# Patient Record
Sex: Male | Born: 1960
Health system: Southern US, Community
[De-identification: ages and names within clinical notes are randomized; demographics above are authoritative.]

## PROBLEM LIST (undated history)

## (undated) DIAGNOSIS — I10 Essential (primary) hypertension: Secondary | ICD-10-CM

## (undated) DIAGNOSIS — K859 Acute pancreatitis without necrosis or infection, unspecified: Secondary | ICD-10-CM

## (undated) DIAGNOSIS — E785 Hyperlipidemia, unspecified: Secondary | ICD-10-CM

## (undated) DIAGNOSIS — E291 Testicular hypofunction: Secondary | ICD-10-CM

## (undated) DIAGNOSIS — K863 Pseudocyst of pancreas: Secondary | ICD-10-CM

## (undated) DIAGNOSIS — E538 Deficiency of other specified B group vitamins: Secondary | ICD-10-CM

## (undated) DIAGNOSIS — K760 Fatty (change of) liver, not elsewhere classified: Secondary | ICD-10-CM

## (undated) DIAGNOSIS — R945 Abnormal results of liver function studies: Secondary | ICD-10-CM

## (undated) DIAGNOSIS — A498 Other bacterial infections of unspecified site: Secondary | ICD-10-CM

## (undated) DIAGNOSIS — S8292XA Unspecified fracture of left lower leg, initial encounter for closed fracture: Secondary | ICD-10-CM

## (undated) DIAGNOSIS — M8580 Other specified disorders of bone density and structure, unspecified site: Secondary | ICD-10-CM

## (undated) DIAGNOSIS — M542 Cervicalgia: Secondary | ICD-10-CM

## (undated) HISTORY — DX: Other specified disorders of bone density and structure, unspecified site: M85.80

## (undated) HISTORY — PX: LEG SURGERY: SHX1003

## (undated) HISTORY — DX: Fatty (change of) liver, not elsewhere classified: K76.0

## (undated) HISTORY — DX: Hyperlipidemia, unspecified: E78.5

## (undated) HISTORY — PX: ELBOW SURGERY: SHX618

## (undated) HISTORY — PX: HERNIA REPAIR: SHX51

## (undated) HISTORY — DX: Acute pancreatitis without necrosis or infection, unspecified: K85.90

## (undated) HISTORY — DX: Unspecified fracture of left lower leg, initial encounter for closed fracture: S82.92XA

## (undated) HISTORY — PX: CHOLECYSTECTOMY: SHX55

## (undated) HISTORY — DX: Testicular hypofunction: E29.1

## (undated) HISTORY — DX: Deficiency of other specified B group vitamins: E53.8

## (undated) HISTORY — DX: Cervicalgia: M54.2

## (undated) HISTORY — DX: Essential (primary) hypertension: I10

## (undated) HISTORY — PX: TIBIA FRACTURE SURGERY: SHX806

## (undated) HISTORY — DX: Other bacterial infections of unspecified site: A49.8

## (undated) HISTORY — DX: Pseudocyst of pancreas: K86.3

## (undated) HISTORY — DX: Abnormal results of liver function studies: R94.5

---

## 2008-12-10 DIAGNOSIS — K859 Acute pancreatitis without necrosis or infection, unspecified: Secondary | ICD-10-CM

## 2008-12-10 DIAGNOSIS — A498 Other bacterial infections of unspecified site: Secondary | ICD-10-CM

## 2008-12-10 HISTORY — DX: Acute pancreatitis without necrosis or infection, unspecified: K85.90

## 2008-12-10 HISTORY — DX: Other bacterial infections of unspecified site: A49.8

## 2009-11-18 ENCOUNTER — Encounter (INDEPENDENT_AMBULATORY_CARE_PROVIDER_SITE_OTHER): Payer: Self-pay | Admitting: *Deleted

## 2009-11-19 ENCOUNTER — Inpatient Hospital Stay (HOSPITAL_COMMUNITY): Admission: EM | Admit: 2009-11-19 | Discharge: 2009-11-30 | Payer: Self-pay | Admitting: Emergency Medicine

## 2009-11-23 ENCOUNTER — Encounter (INDEPENDENT_AMBULATORY_CARE_PROVIDER_SITE_OTHER): Payer: Self-pay | Admitting: *Deleted

## 2009-11-24 ENCOUNTER — Ambulatory Visit: Payer: Self-pay | Admitting: Gastroenterology

## 2009-11-30 ENCOUNTER — Encounter (INDEPENDENT_AMBULATORY_CARE_PROVIDER_SITE_OTHER): Payer: Self-pay | Admitting: *Deleted

## 2009-12-07 ENCOUNTER — Encounter (INDEPENDENT_AMBULATORY_CARE_PROVIDER_SITE_OTHER): Payer: Self-pay | Admitting: *Deleted

## 2009-12-08 ENCOUNTER — Ambulatory Visit: Payer: Self-pay | Admitting: Internal Medicine

## 2009-12-08 DIAGNOSIS — E119 Type 2 diabetes mellitus without complications: Secondary | ICD-10-CM | POA: Insufficient documentation

## 2009-12-08 DIAGNOSIS — Z8719 Personal history of other diseases of the digestive system: Secondary | ICD-10-CM | POA: Insufficient documentation

## 2009-12-08 DIAGNOSIS — I10 Essential (primary) hypertension: Secondary | ICD-10-CM | POA: Insufficient documentation

## 2009-12-08 DIAGNOSIS — E1165 Type 2 diabetes mellitus with hyperglycemia: Secondary | ICD-10-CM | POA: Insufficient documentation

## 2009-12-08 DIAGNOSIS — E785 Hyperlipidemia, unspecified: Secondary | ICD-10-CM | POA: Insufficient documentation

## 2009-12-10 DIAGNOSIS — R7989 Other specified abnormal findings of blood chemistry: Secondary | ICD-10-CM

## 2009-12-10 DIAGNOSIS — M8580 Other specified disorders of bone density and structure, unspecified site: Secondary | ICD-10-CM

## 2009-12-10 HISTORY — DX: Other specified abnormal findings of blood chemistry: R79.89

## 2009-12-10 HISTORY — DX: Other specified disorders of bone density and structure, unspecified site: M85.80

## 2009-12-29 ENCOUNTER — Ambulatory Visit: Payer: Self-pay | Admitting: Internal Medicine

## 2009-12-29 ENCOUNTER — Ambulatory Visit: Payer: Self-pay | Admitting: Gastroenterology

## 2009-12-29 DIAGNOSIS — K862 Cyst of pancreas: Secondary | ICD-10-CM | POA: Insufficient documentation

## 2009-12-29 DIAGNOSIS — K863 Pseudocyst of pancreas: Secondary | ICD-10-CM

## 2009-12-29 DIAGNOSIS — K859 Acute pancreatitis without necrosis or infection, unspecified: Secondary | ICD-10-CM | POA: Insufficient documentation

## 2009-12-29 LAB — CONVERTED CEMR LAB
AST: 38 units/L — ABNORMAL HIGH (ref 0–37)
Albumin: 4.2 g/dL (ref 3.5–5.2)
Alkaline Phosphatase: 85 units/L (ref 39–117)
Bilirubin, Direct: 0.2 mg/dL (ref 0.0–0.3)
GFR calc non Af Amer: 85.03 mL/min (ref >60)
Glucose, Bld: 116 mg/dL — ABNORMAL HIGH (ref 70–99)
HDL: 28.6 mg/dL — ABNORMAL LOW (ref >39.00)
LDL Cholesterol: 112 mg/dL — ABNORMAL HIGH (ref 0–99)
Lipase: 28 units/L (ref 11.0–59.0)
Potassium: 4.7 meq/L (ref 3.5–5.1)
Sodium: 139 meq/L (ref 135–145)
Total CHOL/HDL Ratio: 6
VLDL: 32.8 mg/dL (ref 0.0–40.0)

## 2010-01-06 ENCOUNTER — Ambulatory Visit: Payer: Self-pay | Admitting: Internal Medicine

## 2010-01-13 ENCOUNTER — Ambulatory Visit: Payer: Self-pay | Admitting: Cardiovascular Disease

## 2010-01-13 DIAGNOSIS — K8020 Calculus of gallbladder without cholecystitis without obstruction: Secondary | ICD-10-CM | POA: Insufficient documentation

## 2010-01-20 ENCOUNTER — Encounter: Payer: Self-pay | Admitting: Gastroenterology

## 2010-02-24 ENCOUNTER — Ambulatory Visit (HOSPITAL_COMMUNITY): Admission: RE | Admit: 2010-02-24 | Discharge: 2010-02-24 | Payer: Self-pay | Admitting: Surgery

## 2010-02-24 ENCOUNTER — Encounter (INDEPENDENT_AMBULATORY_CARE_PROVIDER_SITE_OTHER): Payer: Self-pay | Admitting: *Deleted

## 2010-03-03 ENCOUNTER — Ambulatory Visit: Payer: Self-pay | Admitting: Gastroenterology

## 2010-03-29 ENCOUNTER — Telehealth: Payer: Self-pay | Admitting: Internal Medicine

## 2010-04-21 ENCOUNTER — Ambulatory Visit: Payer: Self-pay | Admitting: Internal Medicine

## 2010-04-28 ENCOUNTER — Ambulatory Visit: Payer: Self-pay | Admitting: Internal Medicine

## 2010-04-28 DIAGNOSIS — E538 Deficiency of other specified B group vitamins: Secondary | ICD-10-CM | POA: Insufficient documentation

## 2010-04-28 LAB — CONVERTED CEMR LAB
ALT: 16 units/L (ref 0–53)
AST: 20 units/L (ref 0–37)
Calcium: 8.8 mg/dL (ref 8.4–10.5)
Cholesterol: 208 mg/dL — ABNORMAL HIGH (ref 0–200)
Creatinine, Ser: 1.1 mg/dL (ref 0.4–1.5)
GFR calc non Af Amer: 80.27 mL/min (ref >60)
HDL: 36.1 mg/dL — ABNORMAL LOW (ref >39.00)
Hgb A1c MFr Bld: 6.2 % (ref 4.6–6.5)
Sodium: 140 meq/L (ref 135–145)
Total Protein: 6.7 g/dL (ref 6.0–8.3)
Triglycerides: 235 mg/dL — ABNORMAL HIGH (ref 0.0–149.0)
Vitamin B-12: 179 pg/mL — ABNORMAL LOW (ref 211–911)

## 2010-04-28 LAB — HM DIABETES FOOT EXAM

## 2010-08-18 ENCOUNTER — Ambulatory Visit: Payer: Self-pay | Admitting: Internal Medicine

## 2010-08-18 LAB — CONVERTED CEMR LAB
BUN: 17 mg/dL (ref 6–23)
Creatinine, Ser: 1.1 mg/dL (ref 0.4–1.5)
GFR calc non Af Amer: 79.29 mL/min (ref >60)
Glucose, Bld: 107 mg/dL — ABNORMAL HIGH (ref 70–99)
Hgb A1c MFr Bld: 6.7 % — ABNORMAL HIGH (ref 4.6–6.5)
Potassium: 5.2 meq/L — ABNORMAL HIGH (ref 3.5–5.1)
Vitamin B-12: 642 pg/mL (ref 211–911)

## 2010-08-25 ENCOUNTER — Ambulatory Visit: Payer: Self-pay | Admitting: Internal Medicine

## 2010-08-25 DIAGNOSIS — F341 Dysthymic disorder: Secondary | ICD-10-CM | POA: Insufficient documentation

## 2010-08-25 IMAGING — CT CT HEAD W/O CM
1 series · 16 of 30 positions shown, 20 images · non-contrast
Comparison: None.

CLINICAL DATA: 48-year-old male with blurred vision.  Pancreatitis.

CT HEAD WITHOUT CONTRAST
TECHNIQUE: Contiguous axial images were obtained from the base of
the skull through the vertex without contrast.

[Series 2: headseq 4.8 h45s · axial · 0.43mm/px · z∈[-110,+42]mm · 16 of 36 slices shown, 20 images]
[im 2/36  brain]
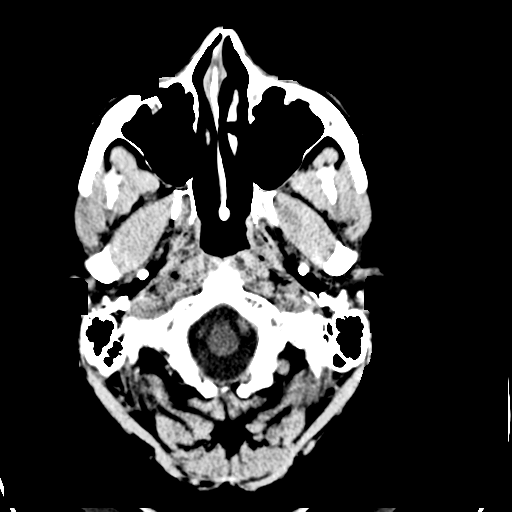
[im 2/36  bone]
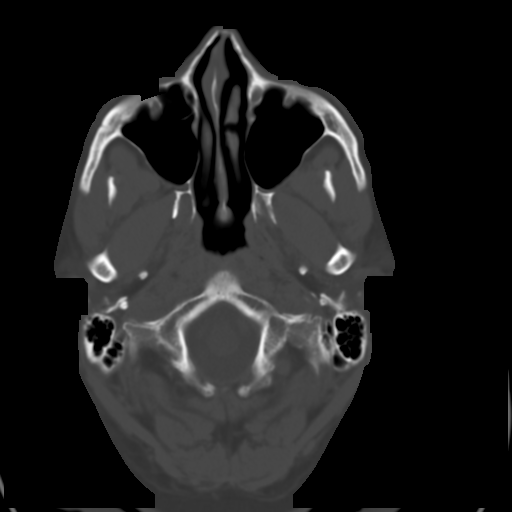
[im 4/36  brain]
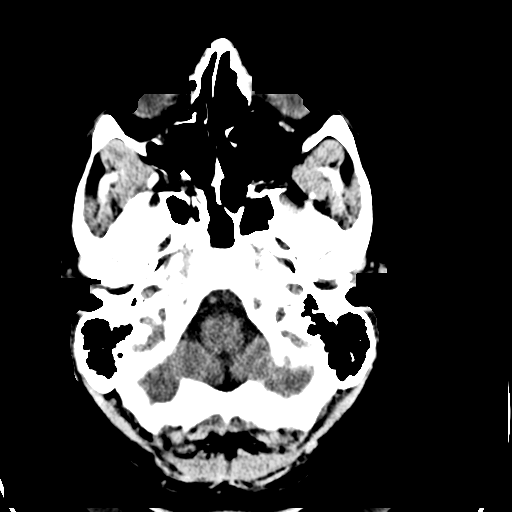
[im 7/36  brain]
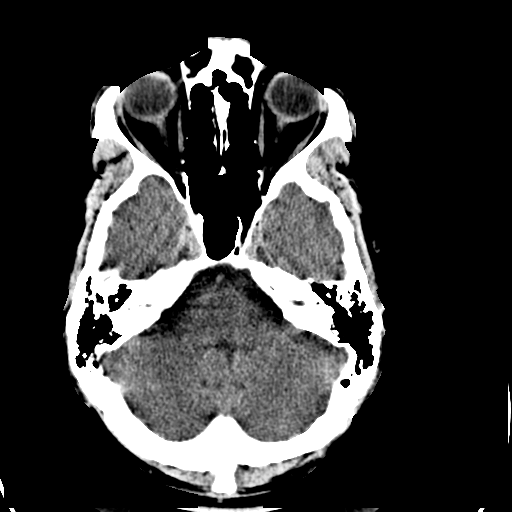
[im 9/36  brain]
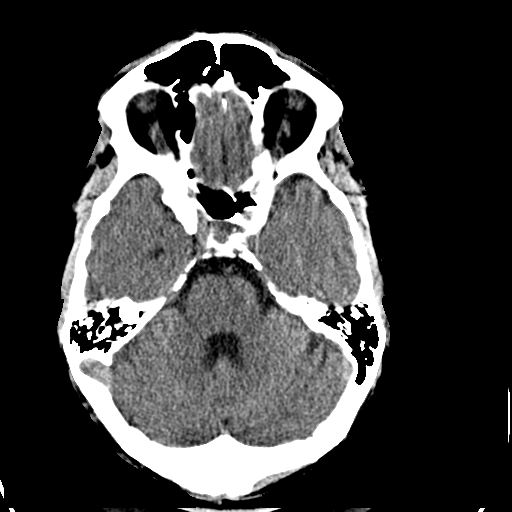
[im 10/36  brain]
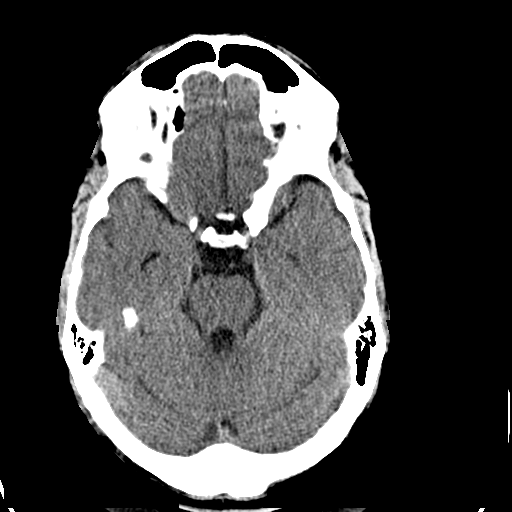
[im 10/36  bone]
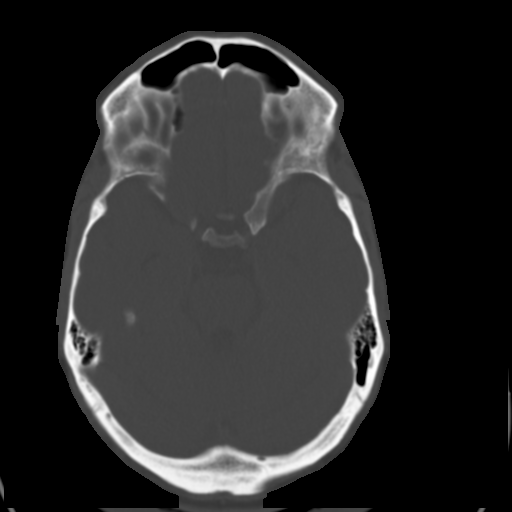
[im 13/36  brain]
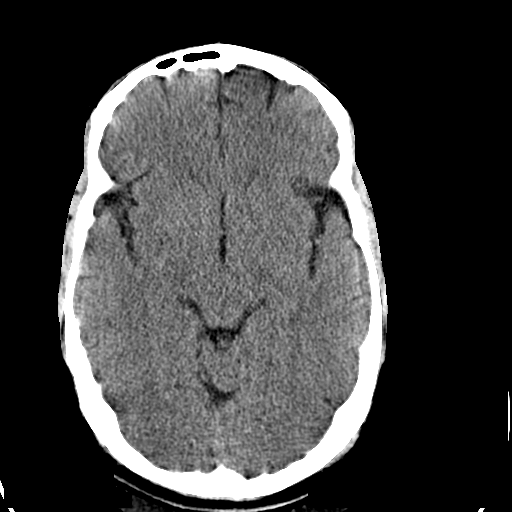
[im 15/36  brain]
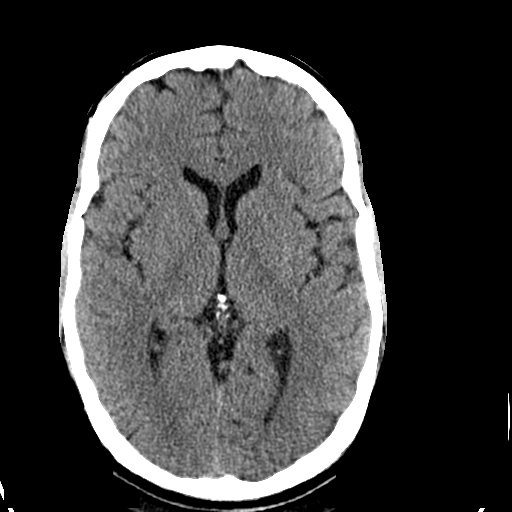
[im 17/36  brain]
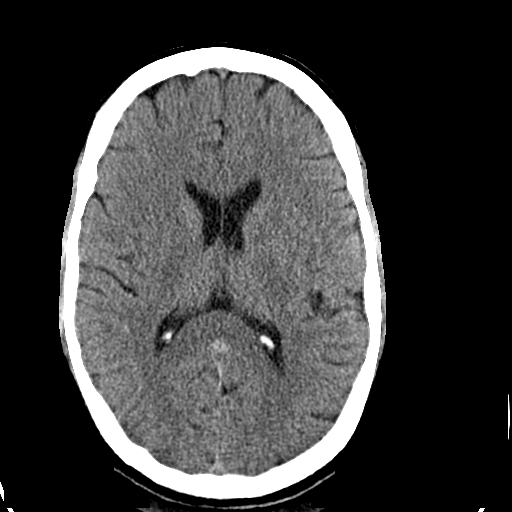
[im 19/36  brain]
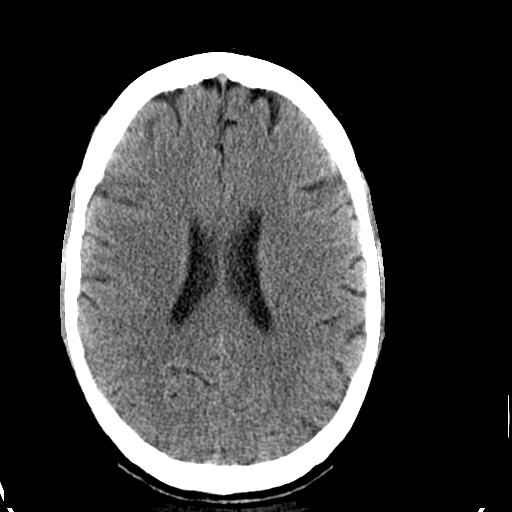
[im 19/36  bone]
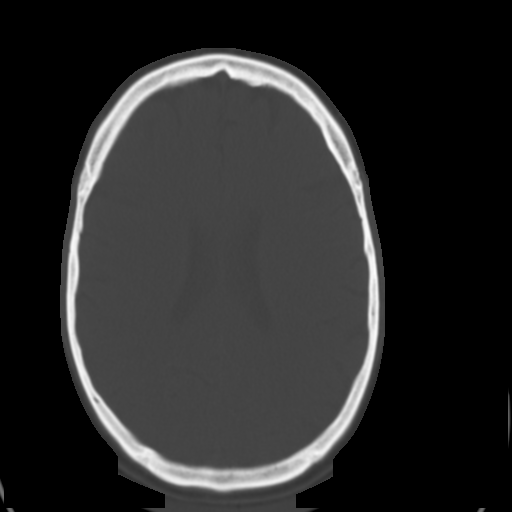
[im 21/36  brain]
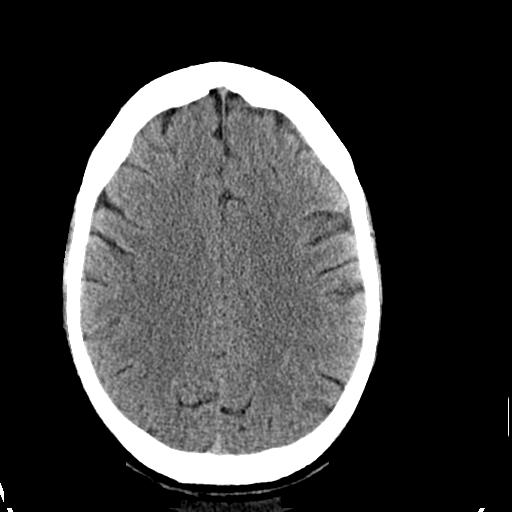
[im 23/36  brain]
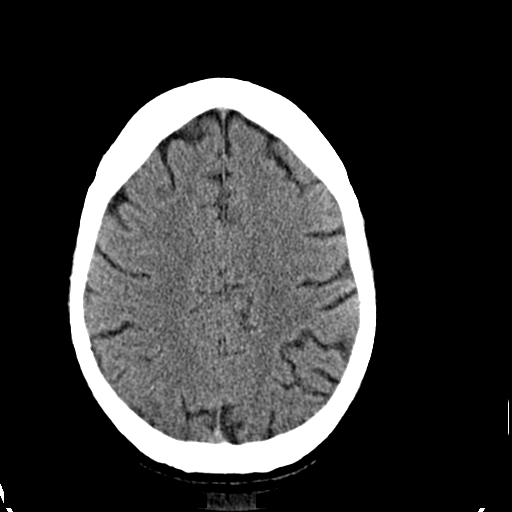
[im 26/36  brain]
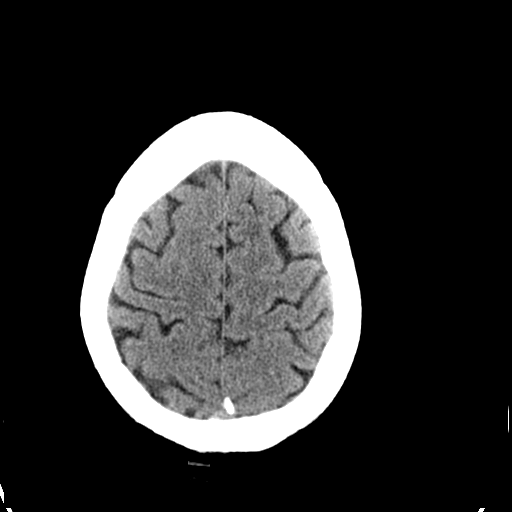
[im 27/36  brain]
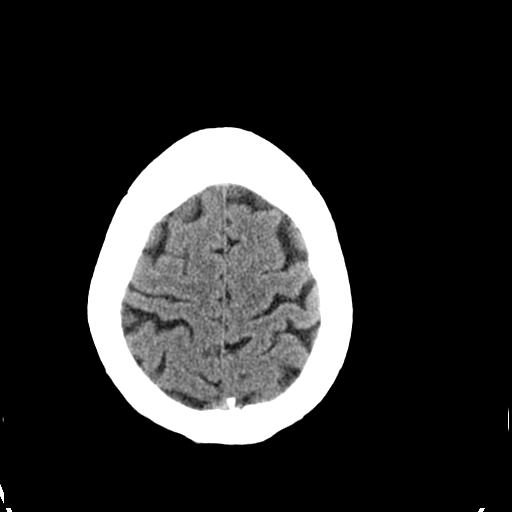
[im 27/36  bone]
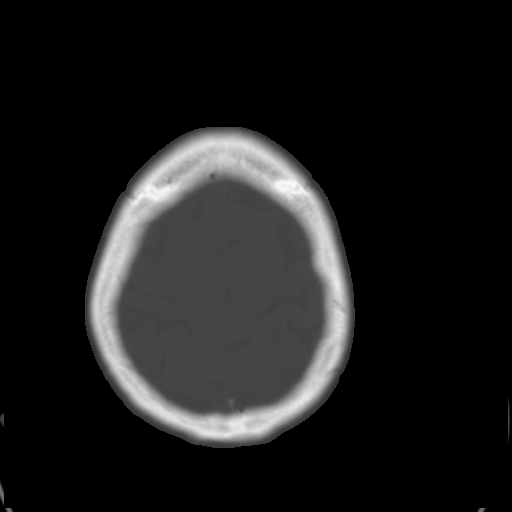
[im 29/36  brain]
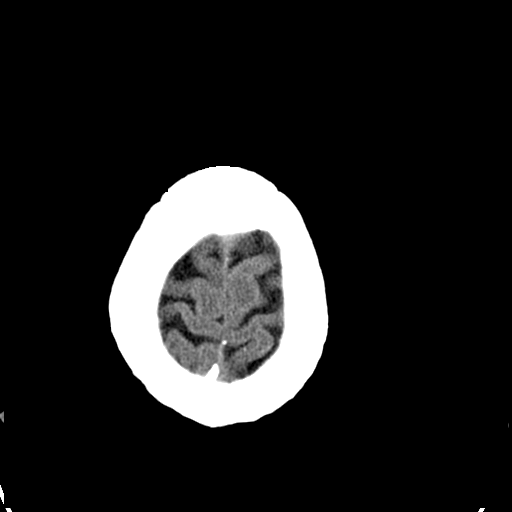
[im 32/36  brain]
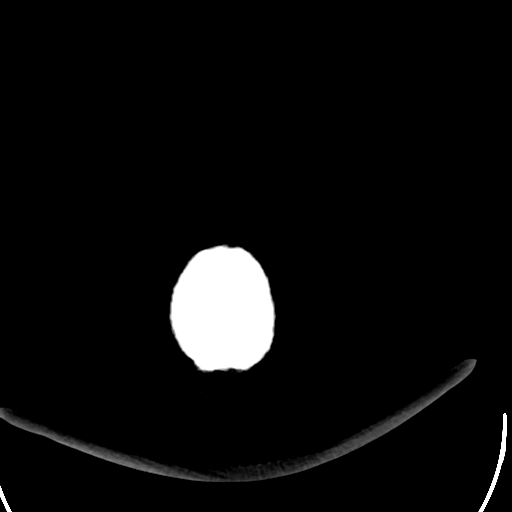
[im 34/36  brain]
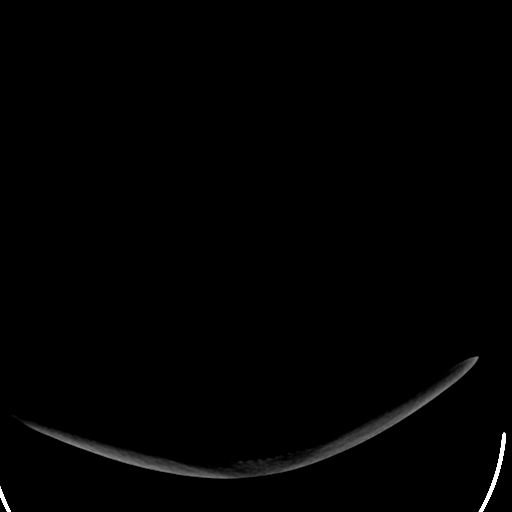

[16 of 30 positions shown; findings below may reference images not displayed]

FINDINGS: Visualized orbit soft tissues are within normal limits.
Visualized scalp soft tissues are within normal limits.  Visualized
paranasal sinuses and mastoids are clear.  No acute osseous
abnormality identified.

Cerebral volume is within normal limits for age.  No midline shift,
ventriculomegaly, mass effect, evidence of mass lesion,
intracranial hemorrhage or evidence of cortically based acute
infarction.  Gray-white matter differentiation is within normal
limits throughout the brain.  No suspicious intracranial vascular
hyperdensity.
IMPRESSION: Visualized orbit soft tissues are within normal limits.  Normal
noncontrast CT appearance of the brain.

## 2010-10-18 ENCOUNTER — Encounter: Payer: Self-pay | Admitting: Internal Medicine

## 2010-10-27 ENCOUNTER — Ambulatory Visit: Payer: Self-pay | Admitting: Internal Medicine

## 2010-10-27 DIAGNOSIS — K7689 Other specified diseases of liver: Secondary | ICD-10-CM | POA: Insufficient documentation

## 2010-10-27 DIAGNOSIS — Z862 Personal history of diseases of the blood and blood-forming organs and certain disorders involving the immune mechanism: Secondary | ICD-10-CM | POA: Insufficient documentation

## 2010-10-27 DIAGNOSIS — Z8639 Personal history of other endocrine, nutritional and metabolic disease: Secondary | ICD-10-CM | POA: Insufficient documentation

## 2010-11-15 ENCOUNTER — Ambulatory Visit: Payer: Self-pay | Admitting: Internal Medicine

## 2010-11-15 DIAGNOSIS — M542 Cervicalgia: Secondary | ICD-10-CM | POA: Insufficient documentation

## 2010-11-15 DIAGNOSIS — R209 Unspecified disturbances of skin sensation: Secondary | ICD-10-CM | POA: Insufficient documentation

## 2010-11-15 DIAGNOSIS — M79609 Pain in unspecified limb: Secondary | ICD-10-CM | POA: Insufficient documentation

## 2010-11-15 DIAGNOSIS — M545 Low back pain, unspecified: Secondary | ICD-10-CM | POA: Insufficient documentation

## 2010-11-17 ENCOUNTER — Ambulatory Visit: Payer: Self-pay | Admitting: Internal Medicine

## 2010-11-24 ENCOUNTER — Ambulatory Visit: Payer: Self-pay | Admitting: Internal Medicine

## 2010-11-29 ENCOUNTER — Ambulatory Visit: Payer: Self-pay | Admitting: Internal Medicine

## 2010-12-08 LAB — CONVERTED CEMR LAB
Alkaline Phosphatase: 105 units/L (ref 39–117)
Bilirubin, Direct: 0.1 mg/dL (ref 0.0–0.3)
CO2: 27 meq/L (ref 19–32)
Calcium: 9 mg/dL (ref 8.4–10.5)
HDL: 30.4 mg/dL — ABNORMAL LOW (ref >39.00)
Sodium: 138 meq/L (ref 135–145)
TSH: 1.95 microintl units/mL (ref 0.35–5.50)
Total CHOL/HDL Ratio: 8
Total CK: 127 units/L (ref 7–232)
Total Protein: 6.9 g/dL (ref 6.0–8.3)
VLDL: 143.2 mg/dL — ABNORMAL HIGH (ref 0.0–40.0)

## 2010-12-10 DIAGNOSIS — E291 Testicular hypofunction: Secondary | ICD-10-CM

## 2010-12-10 HISTORY — DX: Testicular hypofunction: E29.1

## 2010-12-15 DIAGNOSIS — M949 Disorder of cartilage, unspecified: Secondary | ICD-10-CM

## 2010-12-15 DIAGNOSIS — M899 Disorder of bone, unspecified: Secondary | ICD-10-CM | POA: Insufficient documentation

## 2011-01-11 NOTE — Assessment & Plan Note (Signed)
Summary: 1 MTH FU  STC   Vital Signs:  Patient profile:   50 year old male Height:      73 inches Weight:      241 pounds BMI:     31.91 Temp:     98 degrees F oral Pulse rate:   89 / minute BP sitting:   112 / 80  (left arm)  Vitals Entered By: Tora Perches (January 06, 2010 9:24 AM) CC: f/u Is Patient Diabetic? Yes   Primary Care Provider:  Jacinta Shoe, MD  CC:  f/u.  History of Present Illness: The patient presents for a follow up of pancreatitis, diabetes, hyperlipidemia (TG). C/o constipation.   Preventive Screening-Counseling & Management  Alcohol-Tobacco     Smoking Status: never  Current Medications (verified): 1)  Gemfibrozil 600 Mg Tabs (Gemfibrozil) .... 1/2 Two Times A Day 2)  Lisinopril 10 Mg Tabs (Lisinopril) .... 1/2 Once Daily 3)  Metformin Hcl 500 Mg Tabs (Metformin Hcl) .Marland Kitchen.. 1 Tab2  Times A Day 4)  Metoprolol Tartrate 50 Mg Tabs (Metoprolol Tartrate) .... 1/2 Two Times A Day 5)  Tylenol Extra Strength 500 Mg Tabs (Acetaminophen) .... 2 Tab By Mouth As Needed 6)  Vitamin D 1000 Unit Tabs (Cholecalciferol) .Marland Kitchen.. 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    Physical Exam  General:  alert and overweight-appearing.   Nose:  no external deformity and no nasal discharge.   Mouth:  good dentition and pharynx pink and moist.   Lungs:  normal respiratory effort,  normal breath sounds, no crackles, and no wheezes.   Heart:  normal rate, no murmur, no gallop, and no rub.   Abdomen:  soft, non-tender, and no masses.   Msk:  Less stiff LS  Extremities:  No edema B Neurologic:  alert & oriented X3 and cranial nerves II-XII intact.   Skin:  Intact without suspicious lesions or rashes Psych:  Cognition and judgment appear intact. Alert and cooperative with normal attention span and concentration. No apparent delusions, illusions, hallucinations   Impression & Recommendations:  Problem # 1:  PANCREATITIS, ACUTE (ICD-577.0) Assessment Improved  Problem # 2:  PSEUDOCYST, PANCREAS (ICD-577.2) Assessment: Comment Only CT is pending   Problem # 3:  HYPERLIPIDEMIA (ICD-272.4) elev TG Assessment: Improved  His updated medication list for this problem includes:    Gemfibrozil 600 Mg Tabs (Gemfibrozil) .Marland Kitchen... 1/2 two times a day  Problem # 4:  HYPERTENSION (ICD-401.9) Assessment: Improved  His updated medication list for this problem includes:    Lisinopril 10 Mg Tabs (Lisinopril) .Marland Kitchen... 1/2 once daily    Metoprolol Tartrate 50 Mg Tabs (Metoprolol tartrate) .Marland Kitchen... 1/2 two times a day  Problem # 5:  DIABETES MELLITUS, TYPE II (ICD-250.00) Assessment: Improved  His updated medication list for this problem includes:    Lisinopril 10 Mg Tabs (Lisinopril) .Marland Kitchen... 1/2 once daily    Metformin Hcl 500 Mg Tabs (Metformin hcl) .Marland Kitchen... 1 tab2  times a day  Complete Medication List:  1)  Gemfibrozil 600 Mg Tabs (Gemfibrozil) .... 1/2 two times a day 2)  Lisinopril 10 Mg Tabs (Lisinopril) .... 1/2 once daily 3)  Metformin Hcl 500 Mg Tabs (Metformin hcl) .Marland Kitchen.. 1 tab2  times a day 4)  Metoprolol Tartrate 50 Mg Tabs (Metoprolol tartrate) .... 1/2 two times a day 5)  Tylenol Extra Strength 500 Mg Tabs (Acetaminophen) .... 2 tab by mouth as needed 6)  Vitamin D 1000 Unit Tabs (Cholecalciferol) .Marland Kitchen.. 1 by mouth qd  Patient Instructions: 1)  Please schedule a follow-up  appointment in 4 months. 2)  BMP prior to visit, ICD-9: 3)  Hepatic Panel prior to visit, ICD-9: 4)  Lipid Panel prior to visit, ICD-9: 5)  HbgA1C prior to visit, ICD-9:272.2  250.00 6)  Vit B12   Influenza Vaccine (to be given today)

## 2011-01-11 NOTE — Assessment & Plan Note (Signed)
Summary: 3 MTH FU STC   Vital Signs:  Patient profile:   50 year old male Height:      73 inches Weight:      258 pounds BMI:     34.16 Temp:     98.6 degrees F oral Pulse rate:   80 / minute Pulse rhythm:   regular Resp:     16 per minute BP sitting:   140 / 92  (left arm) Cuff size:   large  Vitals Entered By: Lanier Prude, CMA(AAMA) (December 15, 2010 9:30 AM) CC: 3 mo f/u  Is Patient Diabetic? Yes Comments pt is currently not taking Gemfibrozil   Primary Care Provider:  Jacinta Shoe, MD  CC:  3 mo f/u .  History of Present Illness: F/u LBP  - much better and cervical pain down L arm - down to L triceps. F/u on elev TG/chol   Current Medications (verified): 1)  Gemfibrozil 600 Mg Tabs (Gemfibrozil) .... 1/2 Two Times A Day 2)  Lisinopril 10 Mg Tabs (Lisinopril) .... 1/2 Once Daily 3)  Metformin Hcl 500 Mg Tabs (Metformin Hcl) .Marland Kitchen.. 1 Tab By Mouth Tid 4)  Metoprolol Tartrate 50 Mg Tabs (Metoprolol Tartrate) .... 1/2 Two Times A Day 5)  Tylenol Extra Strength 500 Mg Tabs (Acetaminophen) .... 2 Tab By Mouth As Needed 6)  Vitamin D 1000 Unit Tabs (Cholecalciferol) .Marland Kitchen.. 1 By Mouth Qd 7)  Vitamin B-12 500 Mcg Tabs (Cyanocobalamin) .Marland Kitchen.. 1 By Mouth Once Daily For Vitamin B12 Deficiency 8)  Onetouch Ultra Test  Strp (Glucose Blood) .... Use Once Daily Prn 9)  Wellbutrin Sr 150 Mg Xr12h-Tab (Bupropion Hcl) .Marland Kitchen.. 1 By Mouth Once Daily 10)  Alprazolam 0.5 Mg Tabs (Alprazolam) .... 1/2 - 1 By Mouth Two Times A Day As Needed Anxiety 11)  Prednisone 10 Mg Tabs (Prednisone) .... Take 40mg  Qd For 3 Days, Then 20 Mg Qd For 3 Days, Then 10mg  Qd For 6 Days, Then Stop. Take Pc. 12)  Tramadol Hcl 50 Mg Tabs (Tramadol Hcl) .Marland Kitchen.. 1-2 Tabs By Mouth Two Times A Day As Needed Pain 13)  Cyclobenzaprine Hcl 10 Mg Tabs (Cyclobenzaprine Hcl) .... 1/2-1 Tab By Mouth Two Times A Day As Needed Muscle Spasms  Allergies (verified): No Known Drug Allergies  Past History:  Past Medical  History: Diabetes mellitus, type II Hypertension Pancreatitis, hx of 2010 (?etiol) Pancreatic pseudocyst, resolved C diff 2010 Hyperlipidemia Vit B12 def 2011 Elevated LFTs 2011 Fatty liver Osteopenia 2011 Neck pain L  Review of Systems  The patient denies chest pain and syncope.    Physical Exam  General:  alert and overweight-appearing.   Msk:  Lumbar-sacral spine is not  tender to palpation over paraspinal muscles and painfull with the ROM  Str leg elev neg B Cervical spine is tender to palpation over paraspinal muscles and with the ROM   Neurologic:  alert & oriented X3 and cranial nerves II-XII intact.  MS is OK x 4 extr   Impression & Recommendations:  Problem # 1:  LOW BACK PAIN, ACUTE (ICD-724.2) Assessment Improved  His updated medication list for this problem includes:    Tylenol Extra Strength 500 Mg Tabs (Acetaminophen) .Marland Kitchen... 2 tab by mouth as needed    Tramadol Hcl 50 Mg Tabs (Tramadol hcl) .Marland Kitchen... 1-2 tabs by mouth two times a day as needed pain    Cyclobenzaprine Hcl 10 Mg Tabs (Cyclobenzaprine hcl) .Marland Kitchen... 1/2-1 tab by mouth two times a day as needed muscle spasms  Naproxen 500 Mg Tabs (Naproxen) .Marland Kitchen... 1 by mouth two times a day pc for pain/arthritis  Problem # 2:  NECK PAIN (ICD-723.1) L Assessment: Unchanged  His updated medication list for this problem includes:    Tylenol Extra Strength 500 Mg Tabs (Acetaminophen) .Marland Kitchen... 2 tab by mouth as needed    Tramadol Hcl 50 Mg Tabs (Tramadol hcl) .Marland Kitchen... 1-2 tabs by mouth two times a day as needed pain    Cyclobenzaprine Hcl 10 Mg Tabs (Cyclobenzaprine hcl) .Marland Kitchen... 1/2-1 tab by mouth two times a day as needed muscle spasms    Naproxen 500 Mg Tabs (Naproxen) .Marland Kitchen... 1 by mouth two times a day pc for pain/arthritis  Problem # 3:  OSTEOPENIA (ICD-733.90) on xray Assessment: New We will get a BDS, testost level  Problem # 4:  HYPERLIPIDEMIA (ICD-272.4) Assessment: Deteriorated Start Gemfibrozil Labs Reviewed: SGOT:  27 (12/08/2010)   SGPT: 33 (12/08/2010)   HDL:30.40 (12/08/2010), 36.10 (04/21/2010)  LDL:112 (12/29/2009)  Chol:236 (12/08/2010), 208 (04/21/2010)  Trig:716.0 Triglyceride is over 400; calculations on Lipids are invalid. mg/dL (29/56/2130), 865.7 (84/69/6295)  Problem # 5:  LIVER FUNCTION TESTS, ABNORMAL, HX OF (ICD-V12.2) Assessment: Improved better off Gemfibrozil  Complete Medication List: 1)  Lisinopril 10 Mg Tabs (Lisinopril) .... 1/2 once daily 2)  Metformin Hcl 500 Mg Tabs (Metformin hcl) .Marland Kitchen.. 1 tab by mouth tid 3)  Metoprolol Tartrate 50 Mg Tabs (Metoprolol tartrate) .... 1/2 two times a day 4)  Tylenol Extra Strength 500 Mg Tabs (Acetaminophen) .... 2 tab by mouth as needed 5)  Vitamin D 1000 Unit Tabs (Cholecalciferol) .Marland Kitchen.. 1 by mouth qd 6)  Vitamin B-12 500 Mcg Tabs (Cyanocobalamin) .Marland Kitchen.. 1 by mouth once daily for vitamin b12 deficiency 7)  Onetouch Ultra Test Strp (Glucose blood) .... Use once daily prn 8)  Wellbutrin Sr 150 Mg Xr12h-tab (Bupropion hcl) .Marland Kitchen.. 1 by mouth once daily 9)  Alprazolam 0.5 Mg Tabs (Alprazolam) .... 1/2 - 1 by mouth two times a day as needed anxiety 10)  Tramadol Hcl 50 Mg Tabs (Tramadol hcl) .Marland Kitchen.. 1-2 tabs by mouth two times a day as needed pain 11)  Cyclobenzaprine Hcl 10 Mg Tabs (Cyclobenzaprine hcl) .... 1/2-1 tab by mouth two times a day as needed muscle spasms 12)  Naproxen 500 Mg Tabs (Naproxen) .Marland Kitchen.. 1 by mouth two times a day pc for pain/arthritis 13)  Lipofen 150 Mg Caps (Fenofibrate) .Marland Kitchen.. 1 by mouth qd  Patient Instructions: 1)  Please schedule a follow-up appointment in 2 months. 2)  BMP prior to visit, ICD-9: 3)  Hepatic Panel prior to visit, ICD-9: 4)  Lipid Panel prior to visit, ICD-9:272.20 5)  Vit D 6)  Testost 780.79 Prescriptions: LIPOFEN 150 MG CAPS (FENOFIBRATE) 1 by mouth qd  #90 x 3   Entered and Authorized by:   Tresa Garter MD   Signed by:   Tresa Garter MD on 12/15/2010   Method used:   Print then Give to  Patient   RxID:   [id number] NAPROXEN 500 MG TABS (NAPROXEN) 1 by mouth two times a day pc for pain/arthritis  #60 x 3   Entered and Authorized by:   Tresa Garter MD   Signed by:   Tresa Garter MD on 12/15/2010   Method used:   Electronically to        [facility] [id number] (retail)       [address]       [place], [place]  [address]  Ph: [phone] or [phone]       Fax: [phone]   RxID:   [id number]    Orders Added: 1)  Est. Patient Level IV [55732]

## 2011-01-11 NOTE — Assessment & Plan Note (Signed)
Summary: low back pain/#/cd   Vital Signs:  Patient profile:   50 year old male Height:      73 inches Weight:      265 pounds BMI:     35.09 Temp:     98.5 degrees F oral Pulse rate:   72 / minute Pulse rhythm:   regular Resp:     16 per minute BP sitting:   148 / 92  (left arm) Cuff size:   large  Vitals Entered By: Lanier Prude, CMA(AAMA) (November 15, 2010 2:55 PM) CC: LBP radiating to Lt arm from lifting injury last week, tingling in fingers Is Patient Diabetic? Yes Comments pt is not taking Gemfibrozil   Primary Care Provider:  Jacinta Shoe, MD  CC:  LBP radiating to Lt arm from lifting injury last week and tingling in fingers.  History of Present Illness: C/o LBP after he picked up and turned with a 70 lbs rele test putting it in the [place] last Wed. He felt a pull. Next day he could hardly move. C/o LBP  9/10 in ams in pms 5/10. OTC meds did not help much He started to have a neck and rad L arm pain in B hands fingers x 2 d  Current Medications (verified): 1)  Gemfibrozil 600 Mg Tabs (Gemfibrozil) .... 1/2 Two Times A Day 2)  Lisinopril 10 Mg Tabs (Lisinopril) .... 1/2 Once Daily 3)  Metformin Hcl 500 Mg Tabs (Metformin Hcl) .Marland Kitchen.. 1 Tab By Mouth Tid 4)  Metoprolol Tartrate 50 Mg Tabs (Metoprolol Tartrate) .... 1/2 Two Times A Day 5)  Tylenol Extra Strength 500 Mg Tabs (Acetaminophen) .... 2 Tab By Mouth As Needed 6)  Vitamin D 1000 Unit Tabs (Cholecalciferol) .Marland Kitchen.. 1 By Mouth Qd 7)  Vitamin B-12 500 Mcg Tabs (Cyanocobalamin) .Marland Kitchen.. 1 By Mouth Once Daily For Vitamin B12 Deficiency 8)  Onetouch Ultra Test  Strp (Glucose Blood) .... Use Once Daily Prn 9)  Wellbutrin Sr 150 Mg Xr12h-Tab (Bupropion Hcl) .Marland Kitchen.. 1 By Mouth Once Daily 10)  Alprazolam 0.5 Mg Tabs (Alprazolam) .... 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  The patient denies anorexia, fever, and dyspnea on exertion.    Physical Exam  General:  alert and overweight-appearing.   Mouth:  good dentition and pharynx pink and moist.   Lungs:  normal respiratory effort, normal breath sounds, no crackles, and no wheezes.   Heart:  normal rate, no murmur, no gallop, and no rub.   Abdomen:  soft, non-tender, and no masses.   Msk:  Lumbar-sacral spine is tender to palpation over paraspinal muscles and painfull with the ROM  Str leg elev neg B Cervical spine is tender to palpation over paraspinal muscles and with the ROM   Extremities:  No clubbing, cyanosis, edema, or deformity noted with normal full range of motion of all joints.   Neurologic:  alert & oriented X3 and cranial nerves II-XII intact.   is OK x 4  extr Skin:  Intact without suspicious lesions or rashes Psych:  Cognition and judgment appear intact. Alert and cooperative with normal attention span and concentration. No apparent delusions, illusions, hallucinationsnot suicidal and not homicidal.     Impression & Recommendations:  Problem # 1:  LOW BACK PAIN, ACUTE (ICD-724.2) MSK strain Assessment New See "Patient Instructions".  His updated medication list for this problem includes:    Tylenol Extra Strength 500 Mg Tabs (Acetaminophen) .Marland Kitchen... 2 tab by mouth as needed    Tramadol Hcl 50 Mg Tabs (Tramadol hcl) .Marland Kitchen... 1-2 tabs by mouth two times a day as needed pain    Cyclobenzaprine Hcl 10 Mg Tabs (Cyclobenzaprine hcl) .Marland Kitchen... 1/2-1 tab by mouth two times a day as needed muscle  spasms  Orders: T-Lumbar Spine 2 Views (72100TC)  Problem # 2:  NECK PAIN (ICD-723.1) Assessment: New See "Patient Instructions".  His updated medication list for this problem includes:    Tylenol Extra Strength 500 Mg Tabs (Acetaminophen) .Marland Kitchen... 2 tab by mouth as needed    Tramadol Hcl 50 Mg Tabs (Tramadol hcl) .Marland Kitchen... 1-2 tabs by mouth two times a day as needed pain    Cyclobenzaprine Hcl 10 Mg Tabs (Cyclobenzaprine hcl) .Marland Kitchen... 1/2-1 tab by mouth two times a day as needed muscle spasms  Orders: T-Cervical Spine Comp 4 Views (72050TC)  Problem # 3:  ARM PAIN (ICD-729.5) L Assessment: New  Orders: T-Cervical Spine Comp 4 Views (72050TC)  Problem # 4:  PARESTHESIA (ICD-782.0) B hands Assessment: New Likely a CTS  Complete Medication List: 1)  Gemfibrozil 600 Mg Tabs (Gemfibrozil) .... 1/2 two times a day 2)  Lisinopril 10 Mg Tabs (Lisinopril) .... 1/2 once daily 3)  Metformin Hcl 500 Mg Tabs (Metformin hcl) .Marland Kitchen.. 1 tab by mouth tid 4)  Metoprolol Tartrate 50 Mg Tabs (Metoprolol tartrate) .... 1/2 two times a day 5)  Tylenol Extra Strength 500 Mg Tabs (Acetaminophen) .... 2 tab by mouth as needed 6)  Vitamin D 1000 Unit Tabs (Cholecalciferol) .Marland Kitchen.. 1 by mouth qd 7)  Vitamin B-12 500 Mcg Tabs (Cyanocobalamin) .Marland Kitchen.. 1 by mouth once daily for vitamin b12 deficiency 8)  Onetouch Ultra Test Strp (Glucose blood) .... Use once daily prn 9)  Wellbutrin Sr 150 Mg Xr12h-tab (Bupropion hcl) .Marland Kitchen.. 1 by mouth once daily 10)  Alprazolam 0.5 Mg Tabs (Alprazolam) .... 1/2 - 1 by mouth two times a day as needed anxiety 11)  Prednisone 10 Mg Tabs (Prednisone) .... Take 40mg  qd for 3 days, then 20 mg qd for 3 days, then 10mg  qd for 6 days, then stop. take pc. 12)  Tramadol Hcl 50 Mg Tabs (Tramadol hcl) .Marland Kitchen.. 1-2 tabs by mouth two times a day as needed pain 13)  Cyclobenzaprine Hcl 10 Mg Tabs (Cyclobenzaprine hcl) .... 1/2-1 tab by mouth two times a day as needed muscle spasms  Patient Instructions: 1)   Please schedule a follow-up appointment in 2 weeks. 2)  Use stretching and balance exercises that I have provided (15 min. or longer every day) 3)  Call if you are not better in a reasonable amount of time or if worse.  Prescriptions: CYCLOBENZAPRINE HCL 10 MG TABS (CYCLOBENZAPRINE HCL) 1/2-1 tab by mouth two times a day as needed muscle spasms  #60 x 3   Entered and Authorized by:   Tresa Garter MD   Signed by:   Tresa Garter MD on 11/15/2010   Method used:   Print then Give to Patient   RxID:  [id number] TRAMADOL HCL 50 MG TABS (TRAMADOL HCL) 1-2 tabs by mouth two times a day as needed pain  #120 x 3   Entered and Authorized by:   Tresa Garter MD   Signed by:   Tresa Garter MD on 11/15/2010   Method used:   Print then Give to Patient   RxID:   [id number] PREDNISONE 10 MG TABS (PREDNISONE) Take 40mg  qd for 3 days, then 20 mg qd for 3 days, then 10mg  qd for 6 days, then stop. Take pc.  #24 x 1   Entered and Authorized by:   Tresa Garter MD   Signed by:   Tresa Garter MD on 11/15/2010   Method used:   Print then Give to Patient   RxID:   [id number]    Orders Added: 1)  T-Lumbar Spine 2 Views [72100TC] 2)  T-Cervical Spine Comp 4 Views [72050TC] 3)  Est. Patient Level IV [01027]

## 2011-01-11 NOTE — Assessment & Plan Note (Signed)
Summary: 3 mo rov /nws  rs'd per pt/nws   Vital Signs:  Patient profile:   50 year old male Height:      73 inches Weight:      255 pounds BMI:     33.76 O2 Sat:      98 % on Room air Temp:     97.8 degrees F oral Pulse rate:   61 / minute BP sitting:   132 / 86  (left arm) Cuff size:   regular  Vitals Entered By: Bill Salinas CMA (August 25, 2010 8:56 AM)  O2 Flow:  Room air  Primary Care Provider:  Jacinta Shoe, MD   History of Present Illness: F/u HTN, pancreatitis, DM C/o stress - one dtr w/anorexia 50 yo; stress at work. C/o anxiety. Can't focus  Current Medications (verified): 1)  Gemfibrozil 600 Mg Tabs (Gemfibrozil) .... 1/2 Two Times A Day 2)  Lisinopril 10 Mg Tabs (Lisinopril) .... 1/2 Once Daily 3)  Metformin Hcl 500 Mg Tabs (Metformin Hcl) .Marland Kitchen.. 1 Tab2  Times A Day 4)  Metoprolol Tartrate 50 Mg Tabs (Metoprolol Tartrate) .... 1/2 Two Times A Day 5)  Tylenol Extra Strength 500 Mg Tabs (Acetaminophen) .... 2 Tab By Mouth As Needed 6)  Vitamin D 1000 Unit Tabs (Cholecalciferol) .Marland Kitchen.. 1 By Mouth Qd 7)  Vitamin B-12 500 Mcg Tabs (Cyanocobalamin) .Marland Kitchen.. 1 By Mouth Once Daily For Vitamin B12 Deficiency 8)  Onetouch Ultra Test  Strp (Glucose Blood) .... Use Once Daily Prn 9)  St Johns Wort 300 Mg Tabs ([address] Johns Wort) .Marland Kitchen.. 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  The  patient denies fever and chest pain.    Physical Exam  General:  alert and overweight-appearing.   Head:  normocephalic and atraumatic.   Eyes:  vision grossly intact, pupils equal, pupils round, and pupils reactive to light.   Nose:  no external deformity and no nasal discharge.   Mouth:  good dentition and pharynx pink and moist.   Neck:  supple.   Lungs:  normal respiratory effort, normal breath sounds, no crackles, and no wheezes.   Heart:  normal rate, no murmur, no gallop, and no rub.   Abdomen:  soft, non-tender, and no masses.   Msk:  LS NT Extremities:  No clubbing, cyanosis, edema, or deformity noted with normal full range of motion of all joints.   Neurologic:  alert & oriented X3 and cranial nerves II-XII intact.   Skin:  Intact without suspicious lesions or rashes Cervical Nodes:  No lymphadenopathy noted Psych:  Cognition and judgment appear intact. Alert and cooperative with normal attention span and concentration. No apparent delusions, illusions, hallucinationsnot suicidal and not homicidal.     Impression & Recommendations:  Problem # 1:  VITAMIN B12 DEFICIENCY (ICD-266.2) Assessment Improved On the regimen of medicine(s) reflected in the chart    Problem # 2:  DEPRESSION/ANXIETY (ICD-300.4) - situational Start Wellbutrin Xanax as needed  Risks vs benefits and controversies of a long term controlled substances use were discussed.   Problem # 3:  DIABETES MELLITUS, TYPE II (ICD-250.00) Assessment: Unchanged  His updated medication list for this problem includes:    Lisinopril 10 Mg Tabs (Lisinopril) .Marland Kitchen... 1/2 once daily    Metformin Hcl 500 Mg Tabs (Metformin hcl) .Marland Kitchen... 1 tab2  times a day  Labs Reviewed: Creat: 1.1 (08/18/2010)    Reviewed HgBA1c results: 6.7 (08/18/2010)  6.2 (04/21/2010)  Problem # 4:  HYPERLIPIDEMIA (ICD-272.4) Assessment: Unchanged  His updated medication list for this problem includes:    Gemfibrozil 600 Mg Tabs (Gemfibrozil)  .Marland Kitchen... 1/2 two times a day  Labs Reviewed: SGOT: 20 (04/21/2010)   SGPT: 16 (04/21/2010)   HDL:36.10 (04/21/2010), 28.60 (12/29/2009)  LDL:112 (12/29/2009)  Chol:208 (04/21/2010), 173 (12/29/2009)  Trig:235.0 (04/21/2010), 164.0 (12/29/2009)  Orders: Admin 1st Vaccine (32440) Flu Vaccine 48yrs + (10272)  Complete Medication List: 1)  Gemfibrozil 600 Mg Tabs (Gemfibrozil) .... 1/2 two times a day 2)  Lisinopril 10 Mg Tabs (Lisinopril) .... 1/2 once daily 3)  Metformin Hcl 500 Mg Tabs (Metformin hcl) .Marland Kitchen.. 1 tab2  times a day 4)  Metoprolol Tartrate 50 Mg Tabs (Metoprolol tartrate) .... 1/2 two times a day 5)  Tylenol Extra Strength 500 Mg Tabs (Acetaminophen) .... 2 tab by mouth as needed 6)  Vitamin D 1000 Unit Tabs (Cholecalciferol) .Marland Kitchen.. 1 by mouth qd 7)  Vitamin B-12 500 Mcg Tabs (Cyanocobalamin) .Marland Kitchen.. 1 by mouth once daily for vitamin b12 deficiency 8)  Onetouch Ultra Test Strp (Glucose blood) .... Use once daily prn 9)  Wellbutrin Sr 150 Mg Xr12h-tab (Bupropion hcl) .Marland Kitchen.. 1 by mouth q am and q lunch (two times a day) 10)  Alprazolam 0.5 Mg Tabs (Alprazolam) .... 1/2 - 1 by mouth two times a day as needed anxiety  Patient Instructions: 1)  Please schedule a follow-up appointment in 3 months. 2)  BMP prior to visit, ICD-9: 3)  Hepatic Panel prior to visit, ICD-9: 4)  Lipid Panel prior to visit, ICD-9: 5)  TSH prior to visit, ICD-9: 6)  HbgA1C prior to visit, ICD-9: 7)  CK  995.20  272.20  250.00 Prescriptions: ALPRAZOLAM 0.5 MG TABS (ALPRAZOLAM) 1/2 - 1 by mouth two times a day as needed anxiety  #60 x 1   Entered and Authorized by:   Tresa Garter MD   Signed by:   Tresa Garter MD on 08/25/2010   Method used:   Print then Give to Patient   RxID:   [id number] WELLBUTRIN SR 150 MG XR12H-TAB (BUPROPION HCL) 1 by mouth q am and q lunch (two times a day)  #60 x 6   Entered and Authorized by:   Tresa Garter MD   Signed by:   Tresa Garter MD on  08/25/2010   Method used:   Print then Give to Patient   RxID:   [id number]     Flu Vaccine Consent Questions     Do you have a history of severe allergic reactions to this vaccine? no    Any prior history of allergic reactions to egg and/or gelatin? no    Do you have a sensitivity to the preservative Thimersol? no    Do you have a past history of Guillan-Barre Syndrome? no    Do you currently have an acute febrile illness? no    Have you ever had a severe reaction to latex? no    Vaccine information given  and explained to patient? yes    Are you currently pregnant? no    Lot Number:[id number]   Exp Date:06/09/2011   Site Given  Left Deltoid IMbflu

## 2011-01-11 NOTE — Assessment & Plan Note (Signed)
Summary: 6-WEEK F/U APPT...LSW.   History of Present Illness Visit Type: Follow-up Visit Primary GI MD: Elie Goody MD [facility] Primary Provider: Jacinta Shoe, MD Requesting Provider: n/a Chief Complaint: F/u for gallbladder surgery. Pt states that he is doing good but had a fever for three days after the surgery. Pt denies any GI complaints  History of Present Illness:   Kevin Miller returns for followup of pancreatitis, and a pancreatic pseudocyst. His most recent CT scan revealed complete resolution of the pseudocyst and a normal-appearing pancreas. Underwent laparoscopic cholecystectomy on March 18 with pathology showing chronic cystitis. He is recovering well from surgery. He has no gastrointestinal complaints. He states he had a fever over the weekend and blood work performed at [facility] was unremarkable. His fever has resolved.   GI Review of Systems      Denies abdominal pain, acid reflux, belching, bloating, chest pain, dysphagia with liquids, dysphagia with solids, heartburn, loss of appetite, nausea, vomiting, vomiting blood, weight loss, and  weight gain.        Denies anal fissure, black tarry stools, change in bowel habit, constipation, diarrhea, diverticulosis, fecal incontinence, heme positive stool, hemorrhoids, irritable bowel syndrome, jaundice, light color stool, liver problems, rectal bleeding, and  rectal pain.   Current Medications (verified): 1)  Gemfibrozil 600 Mg Tabs (Gemfibrozil) .... 1/2 Two Times A Day 2)  Lisinopril 10 Mg Tabs (Lisinopril) .... 1/2 Once Daily 3)  Metformin Hcl 500 Mg Tabs (Metformin Hcl) .Marland Kitchen.. 1 Tab2  Times A Day 4)  Metoprolol Tartrate 50 Mg Tabs (Metoprolol Tartrate) .... 1/2 Two Times A Day 5)  Tylenol Extra Strength 500 Mg Tabs (Acetaminophen) .... 2 Tab By Mouth As Needed 6)  Vitamin D 1000 Unit Tabs (Cholecalciferol) .Marland Kitchen.. 1 By Mouth Qd  Allergies (verified): No Known Drug Allergies  Past History:  Past Medical  History: Diabetes mellitus, type II Hypertension Pancreatitis, hx of 2010 (?etiol) Pancreatic pseudocyst, resolved C diff 2010 Hyperlipidemia  Past Surgical History: L leg fracture - rod Cholecystectomy, 02/2010 Hernia Surgery  Family History: M COPD PVD F alcohol liver No FH of Colon Cancer:  Social History: Reviewed history from 12/29/2009 and no changes required. 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         The pertinent positives and negatives are noted as above and in the HPI. All other ROS were reviewed and were negative.   Vital Signs:  Patient profile:   50 year old male Height:      73 inches Weight:      230 pounds BMI:     30.45 BSA:     2.29 Temp:     97.6 degrees F oral Pulse rate:   88 / minute Pulse rhythm:   regular BP sitting:   120 / 72  (left arm) Cuff size:   regular  Vitals Entered By: Kevin Miller CMA (March 03, 2010 9:09 AM)  Physical Exam  General:  Well developed, well nourished, no acute distress. Head:  Normocephalic and atraumatic. Eyes:  PERRLA, no icterus. Mouth:  No deformity or lesions, dentition normal. Lungs:  Clear throughout to auscultation. Heart:  Regular rate and rhythm; no murmurs, rubs,  or bruits. Abdomen:  Soft, nontender and nondistended. No masses, hepatosplenomegaly or hernias noted. Normal bowel sounds. Nicely healing incisions, consistent with his laparoscopic  cholecystectomy Neurologic:  Alert and  oriented x4;  grossly normal neurologically. Psych:  Alert and cooperative. Normal mood and affect.  Impression & Recommendations:  Problem # 1:  PSEUDOCYST, PANCREAS (ICD-577.2) Assessment Improved Resolved.  Problem # 2:  PANCREATITIS, ACUTE (ICD-577.0) Assessment: Improved Resolved. Suspected, but not proven, biliary pancreatitis. Gallbladder pathology showed  chronic cholecystitis. Expectant management. GI followup p.r.n.  Problem # 3:  SCREENING COLORECTAL-CANCER (ICD-V76.51) Average risk for colorectal cancer. Colonoscopy at age 47.  Patient Instructions: 1)  Please continue current medications.  2)  Please schedule a follow-up appointment as needed.  3)  Copy sent to : Jacinta Shoe, MD 4)                         Kevin Miller, M.D. 5)  The medication list was reviewed and reconciled.  All changed / newly prescribed medications were explained.  A complete medication list was provided to the patient / caregiver.  Appended Document: 6-WEEK F/U APPT...LSW.    Clinical Lists Changes  Observations: Added new observation of COLONNXTDUE: 10/2012 (03/03/2010 12:27)

## 2011-01-11 NOTE — Progress Notes (Signed)
Summary: Metformin/Metoprolol refill  Phone Note Refill Request Message from:  Fax from Pharmacy on March 29, 2010 11:59 AM  Refills Requested: Medication #1:  METFORMIN HCL 500 MG TABS 1 tab2  times a day  Medication #2:  METOPROLOL TARTRATE 50 MG TABS 1/2 two times a day  Method Requested: requests 90 day supply Initial call taken by: Lucious Groves,  March 29, 2010 11:59 AM    Prescriptions: METOPROLOL TARTRATE 50 MG TABS (METOPROLOL TARTRATE) 1/2 two times a day  #90 x 3   Entered by:   Lucious Groves   Authorized by:   Tresa Garter MD   Signed by:   Lucious Groves on 03/29/2010   Method used:   Faxed to ...       [facility]  [address] (retail)       [address]       [place], [place]  [address]       Ph: [phone] or [phone]       Fax: [phone]   RxID:   [id number] METFORMIN HCL 500 MG TABS (METFORMIN HCL) 1 tab2  times a day  #180 x 3   Entered by:   Lucious Groves   Authorized by:   Tresa Garter MD   Signed by:   Lucious Groves on 03/29/2010   Method used:   Faxed to ...       [facility]  [address] (retail[facility]       [address]       [place], [place]  [address]       Ph: [phone] or [phone]       Fax: [phone]   RxID:   [id number]

## 2011-01-11 NOTE — Assessment & Plan Note (Signed)
Summary: HOSPITAL F/U PER [facility]--[facility]   History of Present Illness Visit Type: follow up Primary GI MD: Elie Goody MD [facility] Primary Provider: Jacinta Shoe, MD Chief Complaint: [facility]. F/U pancreatitis; patient doing well History of Present Illness:   This is a 50 year old white male, who was hospitalized in December 2010 for acute pancreatitis. I saw him during his hospital stay reviewed the discharge summary. An 8 cm pseudocyst had developed by the end of his hospitalization. He lost about 20 pounds during his hospital stay, and with changes in diet, he has lost an additional 15 pounds since discharge. He has mild constipation, but denies any abdominal pain, specifically no postprandial symptoms.   GI Review of Systems    Reports weight loss.   Weight loss of 35 pounds   Denies abdominal pain, acid reflux, belching, bloating, chest pain, dysphagia with liquids, dysphagia with solids, heartburn, loss of appetite, nausea, vomiting, vomiting blood, and  weight gain.      Reports constipation.     Denies anal fissure, black tarry stools, change in bowel habit, diarrhea, diverticulosis, fecal incontinence, heme positive stool, hemorrhoids, irritable bowel syndrome, jaundice, light color stool, liver problems, rectal bleeding, and  rectal pain.   Current Medications (verified): 1)  Famotidine 20 Mg Tabs (Famotidine) .... Two Times A Day 2)  Gemfibrozil 600 Mg Tabs (Gemfibrozil) .... 1/2 Two Times A Day 3)  Lisinopril 10 Mg Tabs (Lisinopril) .... 1/2 Once Daily 4)  Metformin Hcl 500 Mg Tabs (Metformin Hcl) .Marland Kitchen.. 1 Tab2  Times A Day 5)  Metoprolol Tartrate 50 Mg Tabs (Metoprolol Tartrate) .... 1/2 Two Times A Day 6)  Tylenol Extra Strength 500 Mg Tabs (Acetaminophen) .... 2 Tab By Mouth As Needed 7)  Vitamin D 1000 Unit Tabs (Cholecalciferol) .Marland Kitchen.. 1 By Mouth Qd 8)  Pancreaze 30865 Unit Cpep (Pancrelipase (Lip-Prot-Amyl)) .Marland Kitchen.. 1 By Mouth Three Times A Day Ac  Allergies (verified): No  Known Drug Allergies  Past History:  Past Medical History: Diabetes mellitus, type II Hypertension Pancreatitis, hx of 2010 (?etiol) Pancreatic pseudocyst C diff 2010 Hyperlipidemia  Past Surgical History: Reviewed history from 12/08/2009 and no changes required. L leg fracture - rod hernia  Family History: Reviewed history from 12/08/2009 and no changes required. 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         The pertinent positives and negatives are noted as above and in the HPI. All other ROS were reviewed and were negative.   Vital Signs:  Patient profile:   50 year old male Height:      73 inches Weight:      242.50 pounds BMI:     32.11 Pulse rate:   64 / minute Pulse rhythm:   regular BP sitting:   110 / 78  (left arm) Cuff size:   regular  Vitals Entered By: June McMurray CMA Duncan Dull) (December 29, 2009 8:31 AM)  Physical Exam  General:  Well developed, well nourished, no acute distress. Head:  Normocephalic and atraumatic. Eyes:  PERRLA, no icterus. Mouth:  No deformity or lesions, dentition normal. Lungs:  Clear throughout to auscultation. Heart:  Regular rate and rhythm; no murmurs, rubs,  or bruits. Abdomen:  Soft, nontender and nondistended. No masses, hepatosplenomegaly  or hernias noted. Normal bowel sounds. Psych:  Alert and cooperative. Normal mood and affect.  Impression & Recommendations:  Problem # 1:  PANCREATITIS, ACUTE (ICD-577.0) Acute pancreatitis, etiology unclear. Microlithiasis as possible cause. He is advised to discontinue Pancreaze and famotidine. We discussed considering elective cholecystectomy for the possibility of microlithiasis, once his pseudocyst has resolved  Problem # 2:   PSEUDOCYST, PANCREAS (ICD-577.2) Repeat CT scan in early February to reassess the pseudocyst. Hopefully, this will resolve on its on over the course of a few months. If the pseudocyst enlarges or does not resolve it will need further intervention.  Other Orders: GI Misc Procedure/ Radiology Order (GI Misc )  Patient Instructions: 1)  You have been sceduled for a CT scan. 2)  Discontinue pancreaze and then famotidine. 3)  Please schedule a follow-up appointment in  6 weeks.  4)  Copy sent to : Sonda Primes, MD 5)  The medication list was reviewed and reconciled.  All changed / newly prescribed medications were explained.  A complete medication list was provided to the patient / caregiver.

## 2011-01-11 NOTE — Op Note (Signed)
Summary: Laparoscopic Cholecystectomy    NAME:  Kevin Miller, Kevin Miller              ACCOUNT NO.:  [id number]      MEDICAL RECORD NO.:  [id number]          PATIENT TYPE:  AMB      LOCATION:  DAY                          FACILITY:  [facility]      PHYSICIAN:  Wilmon Arms. Corliss Skains, M.D. DATE OF BIRTH:  Feb 27, 1961      DATE OF PROCEDURE:  02/24/2010   DATE OF DISCHARGE:                                  OPERATIVE REPORT      PREOPERATIVE DIAGNOSES:   1. History of gallstone pancreatitis.   2. Chronic cholecystitis.      POSTOPERATIVE DIAGNOSES:   1. History of gallstone pancreatitis.   2. Chronic cholecystitis.      PROCEDURE PERFORMED:  Laparoscopic cholecystectomy with intraoperative   cholangiogram.      SURGEON:  Wilmon Arms. Tsuei, M.D.      ANESTHESIA:  General.      INDICATIONS:  This is a 50 year old male in reasonably medical condition   with mild diabetes and hypertension who presents 3 months after having   an attack of severe pancreatitis.  The exact etiology of his   pancreatitis was unclear, but he did have elevated bilirubin on   admission.  His pancreatitis has improved and the pseudocyst has   resolved.  Studies have not shown any obvious gallstones, but his   gastroenterologist suspects that he could have gallbladder sludge or   microlithiasis as the etiology for his pancreatitis.  He is now referred   for elective cholecystectomy.      DESCRIPTION OF PROCEDURE:  The patient was brought to the operating room   and placed in the supine position on the operating room table.  After an   adequate level of general anesthesia was obtained, the patient's abdomen   was prepped with ChloraPrep and draped in sterile fashion.  A time-out   was taken to assure the proper patient and proper procedure.  We   infiltrated the area below the umbilicus with 0.25%  Marcaine with   epinephrine.  A transverse incision was made.  Dissection was carried   down to the fascia.  The fascia was opened  vertically.  We entered the   peritoneal cavity bluntly.  A stay suture of zero Vicryl was placed   around the fascial opening.  The Hasson cannula was inserted and this   was secured to the stay suture.  Pneumoperitoneum was obtained by   insufflating CO2 and maintaining maximal pressure of 15 mmHg.  The   laparoscope was inserted.  The patient was positioned in reversed   Trendelenburg and tilted to his left.  An 11 mm port was placed in the   subxiphoid position.  Two 5 mm ports were placed in right upper   quadrant.  The gallbladder was quite dilated, but was thin walled.  This   was grasped with a clamp and elevated.  There were a lot of omental   adhesions to the gallbladder.  These were taken down bluntly.  We   dissected around the cystic duct and the  cystic artery.  The cystic   artery was ligated with three clips and divided.  The cystic duct was   ligated and clipped distally.  A small opening was created on the cystic   duct.  A Cook cholangiogram catheter was inserted through a stab   incision and threaded into the cystic duct.  It was secured with a clip.   A cholangiogram was then obtained which showed good flow proximally and   distally to the biliary tree which did not appear to be dilated.   Contrast flowed easily into the duodenum.  There was some question of   some sludge at the ampulla.  I asked the radiologist to read this and he   did not feel that this represented any common bile duct stones.   Contrast did flow easily into the duodenum.  The catheter was then   removed and the cystic duct was ligated with clips and divided.  The   gallbladder was then dissected free from the liver.  A tiny pinhole was   inadvertently made and a little bit of bile was spilled.  No stones were   noted.  The gallbladder was then placed in an EndoCatch sac.  We   inspected the gallbladder fossa and used cautery for hemostasis.  We   thoroughly irrigated the right upper quadrant.  The  gallbladder and   EndoCatch sac were then removed through the umbilical port sites.  The   fascia was closed with a pursestring suture.  We again inspected the   right upper quadrant and suctioned as much irrigation as possible.   Pneumoperitoneum was then released as we removed the trocars.  The 4-0   Monocryl was used to close the skin incisions.  Steri-Strips and clean   dressings were applied.  The patient was then extubated and brought to   the recovery room in stable condition.  All sponge, instrument and   needle counts were correct.               Wilmon Arms. Corliss Skains, M.D.            MKT/MEDQ  D:  02/24/2010  T:  02/24/2010  Job:  [id number]      Electronically Signed by Manus Rudd M.D. on 02/24/2010 81:19:14 PM            SP-Surgical Pathology - STATUS: Final  .                                         Perform Date: 18Mar11 00:01  Ordered By: Hoy Finlay MD , PROVIDER         Ordered Date:  Facility: [facility]                              Department: [facility]  Service Report Text  [facility]   [address], [address]   [place], [place] [address]   Telephone [phone] or [phone] Fax [phone]    REPORT OF SURGICAL PATHOLOGY    Case #: [id number]   Patient Name: Kevin Miller, Kevin Miller   Office Chart Number: [id number]    MRN: [id number]   Pathologist: Windy Kalata   DOB/Age November 25, 1961 (Age: 70) Gender: M   Date Taken: 02/24/2010   Date Received: 02/24/2010    FINAL DIAGNOSIS   ***  Microscopic Examination and Diagnosis***    1. GALLBLADDER, : CHRONIC CHOLECYSTITIS    DATE REPORTED: 02/27/2010   *** Electronically Signed Out by Palma Holter, Lyn Hollingshead., Pathologist, Electronic Signature ***    CLINICAL HISTORY    SPECIMEN(S) OBTAINED   1. Gallbladder,    SPECIMEN COMMENTS:   1. Chronic calculus (tc)    Gross Description   1. Size/?Intact: 8.6 x 4 x 2.5 cm, intact   Serosal surface: Tan-Pink, smooth   Mucosa/Wall: Tan-Green to  hyperemic, smooth, soft. Wall: Up to 0.2 cm thick.   Contents: Bile, no gross stones   Cystic duct: Patent   Block summary: One block (sw:mz 02/24/10)    MICROSCOPIC DESCRIPTION    CASE COMMENTS   Additional Information  HL7 RESULT STATUS : F  External IF Update Timestamp : 2010-02-27:16:13:00.000000

## 2011-01-11 NOTE — Consult Note (Signed)
Summary: [facility]   Imported By: Lester Bernice 03/02/2010 08:41:35  _____________________________________________________________________  External Attachment:    Type:   Image     Comment:   External Document

## 2011-01-11 NOTE — Assessment & Plan Note (Signed)
Summary: elevated labs-lb   Vital Signs:  Patient profile:   50 year old male Height:      73 inches Weight:      258 pounds BMI:     34.16 Temp:     98.2 degrees F oral Pulse rate:   72 / minute Pulse rhythm:   regular Resp:     16 per minute BP sitting:   128 / 80  (left arm) Cuff size:   regular  Vitals Entered By: Lanier Prude, Beverly Gust) (October 27, 2010 2:46 PM) CC: elevated LFTs Is Patient Diabetic? Yes   Primary Care Alonte Wulff:  Jacinta Shoe, MD  CC:  elevated LFTs.  History of Present Illness: C/o elev LFT on 05-21-2062 ALT 51 AST 106  Current Medications (verified): 1)  Gemfibrozil 600 Mg Tabs (Gemfibrozil) .... 1/2 Two Times A Day 2)  Lisinopril 10 Mg Tabs (Lisinopril) .... 1/2 Once Daily 3)  Metformin Hcl 500 Mg Tabs (Metformin Hcl) .Marland Kitchen.. 1 Tab2  Times A Day 4)  Metoprolol Tartrate 50 Mg Tabs (Metoprolol Tartrate) .... 1/2 Two Times A Day 5)  Tylenol Extra Strength 500 Mg Tabs (Acetaminophen) .... 2 Tab By Mouth As Needed 6)  Vitamin D 1000 Unit Tabs (Cholecalciferol) .Marland Kitchen.. 1 By Mouth Qd 7)  Vitamin B-12 500 Mcg Tabs (Cyanocobalamin) .Marland Kitchen.. 1 By Mouth Once Daily For Vitamin B12 Deficiency 8)  Onetouch Ultra Test  Strp (Glucose Blood) .... Use Once Daily Prn 9)  Wellbutrin Sr 150 Mg Xr12h-Tab (Bupropion Hcl) .Marland Kitchen.. 1 By Mouth Once Daily 10)  Alprazolam 0.5 Mg Tabs (Alprazolam) .... 1/2 - 1 By Mouth Two Times A Day As Needed Anxiety  Allergies (verified): No Known Drug Allergies  Past History:  Past Medical History: Diabetes mellitus, type II Hypertension Pancreatitis, hx of 2010 (?etiol) Pancreatic pseudocyst, resolved C diff 2010 Hyperlipidemia Vit B12 def 2011 Elevated LFTs 2011 Fatty liver  Past Surgical History: Reviewed history from 03/03/2010 and no changes required. L leg fracture - rod Cholecystectomy, 02/2010 Hernia Surgery  Physical Exam  General:  alert and overweight-appearing.   Mouth:  good dentition and pharynx pink and moist.     Lungs:  normal respiratory effort, normal breath sounds, no crackles, and no wheezes.   Heart:  normal rate, no murmur, no gallop, and no rub.   Abdomen:  soft, non-tender, and no masses.   Msk:  LS NT Neurologic:  alert & oriented X3 and cranial nerves II-XII intact.   Skin:  Intact without suspicious lesions or rashes Psych:  Cognition and judgment appear intact. Alert and cooperative with normal attention span and concentration. No apparent delusions, illusions, hallucinationsnot suicidal and not homicidal.     Impression & Recommendations:  Problem # 1:  LIVER FUNCTION TESTS, ABNORMAL, HX OF (ICD-V12.2) - likely due to #2 Assessment New Will recheck. May neeed to hold Gemfibrozil x 1-2 wks too...  Problem # 2:  FATTY LIVER DISEASE (ICD-571.8) Assessment: Deteriorated Loose wt, good diet  Problem # 3:  DIABETES MELLITUS, TYPE II (ICD-250.00) Assessment: Unchanged  His updated medication list for this problem includes:    Lisinopril 10 Mg Tabs (Lisinopril) .Marland Kitchen... 1/2 once daily    Metformin Hcl 500 Mg Tabs (Metformin hcl) .Marland Kitchen... 1 tab 2  times a day  Problem # 4:  VITAMIN B12 DEFICIENCY (ICD-266.2) Assessment: Comment Only On the regimen of medicine(s) reflected in the chart    Complete Medication List: 1)  Gemfibrozil 600 Mg Tabs (Gemfibrozil) .... 1/2 two times a day 2)  Lisinopril 10 Mg Tabs (Lisinopril) .... 1/2 once daily 3)  Metformin Hcl 500 Mg Tabs (Metformin hcl) .Marland Kitchen.. 1 tab by mouth tid 4)  Metoprolol Tartrate 50 Mg Tabs (Metoprolol tartrate) .... 1/2 two times a day 5)  Tylenol Extra Strength 500 Mg Tabs (Acetaminophen) .... 2 tab by mouth as needed 6)  Vitamin D 1000 Unit Tabs (Cholecalciferol) .Marland Kitchen.. 1 by mouth qd 7)  Vitamin B-12 500 Mcg Tabs (Cyanocobalamin) .Marland Kitchen.. 1 by mouth once daily for vitamin b12 deficiency 8)  Onetouch Ultra Test Strp (Glucose blood) .... Use once daily prn 9)  Wellbutrin Sr 150 Mg Xr12h-tab (Bupropion hcl) .Marland Kitchen.. 1 by mouth once daily 10)   Alprazolam 0.5 Mg Tabs (Alprazolam) .... 1/2 - 1 by mouth two times a day as needed anxiety  Patient Instructions: 1)  Keep return office visit on 12/16 2)  In 2 wks get labs after you hold Gemfibrozil x 1 wk 3)  BMP prior to visit, ICD-9: 4)  Hepatic Panel prior to visit, ICD-9: 5)  Lipid Panel prior to visit, ICD-9:790.5  995.20 6)  Chron hepatitis serology 7)  Ferritin, cerruloplasmin 8)  HbgA1C prior to visit, ICD-9: Prescriptions: ONETOUCH ULTRA TEST  STRP (GLUCOSE BLOOD) use once daily prn  #50 x 12   Entered and Authorized by:   Tresa Garter MD   Signed by:   Tresa Garter MD on 10/27/2010   Method used:   Print then Give to Patient   RxID:   [id number] METFORMIN HCL 500 MG TABS (METFORMIN HCL) 1 tab by mouth tid  #90 x 11   Entered and Authorized by:   Tresa Garter MD   Signed by:   Tresa Garter MD on 10/27/2010   Method used:   Print then Give to Patient   RxID:   [id number]    Orders Added: 1)  Est. Patient Level IV [47425]

## 2011-01-11 NOTE — Assessment & Plan Note (Signed)
Summary: 4 MO ROV /NWS  #   Vital Signs:  Patient profile:   50 year old male Height:      73 inches Pulse rate:   72 / minute Resp:     12 per minute BP sitting:   130 / 80 CC: 4 mo rtn ov./kb Is Patient Diabetic? Yes Pain Assessment Patient in pain? no      Comments Patient notes that he is not taking vitamin D or Gemribrozil and rarely takes Tylenol ES./kb   Primary Care Provider:  Jacinta Shoe, MD  CC:  4 mo rtn ov./kb.  History of Present Illness: The patient presents for a follow up of hypertension, diabetes, hyperlipidemia, pancreatitis   Current Medications (verified): 1)  Gemfibrozil 600 Mg Tabs (Gemfibrozil) .... 1/2 Two Times A Day 2)  Lisinopril 10 Mg Tabs (Lisinopril) .... 1/2 Once Daily 3)  Metformin Hcl 500 Mg Tabs (Metformin Hcl) .Marland Kitchen.. 1 Tab2  Times A Day 4)  Metoprolol Tartrate 50 Mg Tabs (Metoprolol Tartrate) .... 1/2 Two Times A Day 5)  Tylenol Extra Strength 500 Mg Tabs (Acetaminophen) .... 2 Tab By Mouth As Needed 6)  Vitamin D 1000 Unit Tabs (Cholecalciferol) .Marland Kitchen.. 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  The patient denies chest pain, dyspnea on exertion, abdominal pain, hematochezia, and severe indigestion/heartburn.    Physical Exam  General:  alert and overweight-appearing.   Head:  normocephalic and atraumatic.   Nose:  no external deformity and no nasal discharge.   Mouth:  good dentition and pharynx pink and moist.   Neck:  supple.   Lungs:  normal respiratory effort, normal breath  sounds, no crackles, and no wheezes.   Heart:  normal rate, no murmur, no gallop, and no rub.   Abdomen:  soft, non-tender, and no masses.   Msk:  LS NT Extremities:  No clubbing, cyanosis, edema, or deformity noted with normal full range of motion of all joints.   Neurologic:  alert & oriented X3 and cranial nerves II-XII intact.   Skin:  Intact without suspicious lesions or rashes Psych:  Cognition and judgment appear intact. Alert and cooperative with normal attention span and concentration. No apparent delusions, illusions, hallucinations  Diabetes Management Exam:    Foot Exam (with socks and/or shoes not present):       Sensory-Pinprick/Light touch:          Left medial foot (L-4): normal          Left dorsal foot (L-5): normal          Left lateral foot (S-1): normal       Sensory-Monofilament:          Left foot: normal       Inspection:          Left foot: normal       Nails:          Left foot: normal   Impression & Recommendations:  Problem # 1:  VITAMIN B12 DEFICIENCY (ICD-266.2) Assessment New Start Vit B12 by mouth The labs were reviewed with  the patient.  Orders: Vit B12 1000 mcg (J3420) Admin of Therapeutic Inj  intramuscular or subcutaneous (30865)  Problem # 2:  CHOLELITHIASIS (ICD-574.20) Assessment: Comment Only GB was removed  Problem # 3:  HYPERTENSION (ICD-401.9) Assessment: Unchanged  His updated medication list for this problem includes:    Lisinopril 10 Mg Tabs (Lisinopril) .Marland Kitchen... 1/2 once daily    Metoprolol Tartrate 50 Mg Tabs (Metoprolol tartrate) .Marland Kitchen... 1/2 two times a day  BP today: 130/80 Prior BP: 120/72 (03/03/2010)  Labs Reviewed: K+: 4.8 (04/21/2010) Creat: : 1.1 (04/21/2010)   Chol: 208 (04/21/2010)   HDL: 36.10 (04/21/2010)   LDL: 112 (12/29/2009)   TG: 235.0 (04/21/2010)  Problem # 4:  DIABETES MELLITUS, TYPE II (ICD-250.00) Assessment: Unchanged  His updated medication list for this problem includes:    Lisinopril 10 Mg Tabs  (Lisinopril) .Marland Kitchen... 1/2 once daily    Metformin Hcl 500 Mg Tabs (Metformin hcl) .Marland Kitchen... 1 tab2  times a day  Labs Reviewed: Creat: 1.1 (04/21/2010)    Reviewed HgBA1c results: 6.2 (04/21/2010)  Complete Medication List: 1)  Gemfibrozil 600 Mg Tabs (Gemfibrozil) .... 1/2 two times a day 2)  Lisinopril 10 Mg Tabs (Lisinopril) .... 1/2 once daily 3)  Metformin Hcl 500 Mg Tabs (Metformin hcl) .Marland Kitchen.. 1 tab2  times a day 4)  Metoprolol Tartrate 50 Mg Tabs (Metoprolol tartrate) .... 1/2 two times a day 5)  Tylenol Extra Strength 500 Mg Tabs (Acetaminophen) .... 2 tab by mouth as needed 6)  Vitamin D 1000 Unit Tabs (Cholecalciferol) .Marland Kitchen.. 1 by mouth qd 7)  Vitamin B-12 500 Mcg Tabs (Cyanocobalamin) .Marland Kitchen.. 1 by mouth once daily for vitamin b12 deficiency 8)  Onetouch Ultra Test Strp (Glucose blood) .... Use once daily prn  Patient Instructions: 1)  Please schedule a follow-up appointment in 3 months. 2)  BMP prior to visit, ICD-9: 3)  Vit B12 266.20 4)  A1c 790.29 5)  Loose wt! Prescriptions: ONETOUCH ULTRA TEST  STRP (GLUCOSE BLOOD) use once daily prn  #50 x 12   Entered and Authorized by:   Tresa Garter MD   Signed by:   Tresa Garter MD on 04/28/2010   Method used:   Print then Give to Patient   RxID:   [id number] VITAMIN B-12 500 MCG TABS (CYANOCOBALAMIN) 1 by mouth once daily for Vitamin B12 deficiency  #100 x 3   Entered and Authorized by:   Tresa Garter MD   Signed by:   Tresa Garter MD on 04/28/2010   Method used:   Print then Give to Patient   RxID:   [id number]    Medication Administration  Injection # 1:    Medication: Vit B12 1000 mcg    Diagnosis: VITAMIN B12 DEFICIENCY (ICD-266.2)    Route: IM    Site: L deltoid    Exp Date: 11/10/2011    Lot #: [id number]    Mfr: [employer]    Patient tolerated injection without complications    Given by: Lucious Groves (Apr 28, 2010 8:45 AM)  Orders Added: 1)  Vit B12 1000 mcg [J3420] 2)  Admin of  Therapeutic Inj  intramuscular or subcutaneous [[id number]] 3)  Est. Patient Level IV [95638]

## 2011-02-09 ENCOUNTER — Other Ambulatory Visit: Payer: Self-pay

## 2011-02-09 ENCOUNTER — Encounter: Payer: Self-pay | Admitting: Internal Medicine

## 2011-02-09 ENCOUNTER — Other Ambulatory Visit: Payer: Self-pay | Admitting: Internal Medicine

## 2011-02-09 ENCOUNTER — Encounter (INDEPENDENT_AMBULATORY_CARE_PROVIDER_SITE_OTHER): Payer: Self-pay | Admitting: *Deleted

## 2011-02-09 DIAGNOSIS — E538 Deficiency of other specified B group vitamins: Secondary | ICD-10-CM

## 2011-02-09 DIAGNOSIS — E785 Hyperlipidemia, unspecified: Secondary | ICD-10-CM

## 2011-02-09 DIAGNOSIS — R5383 Other fatigue: Secondary | ICD-10-CM

## 2011-02-09 DIAGNOSIS — R5381 Other malaise: Secondary | ICD-10-CM

## 2011-02-09 LAB — BASIC METABOLIC PANEL
BUN: 16 mg/dL (ref 6–23)
Calcium: 9.4 mg/dL (ref 8.4–10.5)
Creatinine, Ser: 1.1 mg/dL (ref 0.4–1.5)
GFR: 75.03 mL/min (ref >60.00)
Glucose, Bld: 105 mg/dL — ABNORMAL HIGH (ref 70–99)
Sodium: 140 mEq/L (ref 135–145)

## 2011-02-09 LAB — LIPID PANEL
Cholesterol: 211 mg/dL — ABNORMAL HIGH (ref 0–200)
HDL: 33.3 mg/dL — ABNORMAL LOW (ref >39.00)
Triglycerides: 203 mg/dL — ABNORMAL HIGH (ref 0.0–149.0)
VLDL: 40.6 mg/dL — ABNORMAL HIGH (ref 0.0–40.0)

## 2011-02-09 LAB — HEPATIC FUNCTION PANEL: Albumin: 4 g/dL (ref 3.5–5.2)

## 2011-02-16 ENCOUNTER — Encounter: Payer: Self-pay | Admitting: Internal Medicine

## 2011-02-16 ENCOUNTER — Ambulatory Visit (INDEPENDENT_AMBULATORY_CARE_PROVIDER_SITE_OTHER): Payer: 59 | Admitting: Internal Medicine

## 2011-02-16 DIAGNOSIS — R0989 Other specified symptoms and signs involving the circulatory and respiratory systems: Secondary | ICD-10-CM | POA: Insufficient documentation

## 2011-02-16 DIAGNOSIS — E291 Testicular hypofunction: Secondary | ICD-10-CM | POA: Insufficient documentation

## 2011-02-16 DIAGNOSIS — R0609 Other forms of dyspnea: Secondary | ICD-10-CM | POA: Insufficient documentation

## 2011-02-16 DIAGNOSIS — J019 Acute sinusitis, unspecified: Secondary | ICD-10-CM

## 2011-02-16 DIAGNOSIS — L559 Sunburn, unspecified: Secondary | ICD-10-CM

## 2011-02-20 NOTE — Assessment & Plan Note (Signed)
Summary: 2 MO FU /NWS  #   Vital Signs:  Patient profile:   50 year old male Height:      73 inches Weight:      254 pounds BMI:     33.63 Temp:     98.4 degrees F oral Pulse rate:   80 / minute Pulse rhythm:   regular Resp:     16 per minute BP sitting:   110 / 80  (left arm) Cuff size:   large  Vitals Entered By: Lanier Prude, CMA(AAMA) (February 16, 2011 8:20 AM) CC: 2 mo f/u  Is Patient Diabetic? Yes Comments pt is  not taking Tramadol, Cyclobenzaprine or Naproxen   Primary Care Provider:  Jacinta Shoe, MD  CC:  2 mo f/u .  History of Present Illness: The patient presents for a follow up of hypertension, diabetes, hyperlipidemia and pancreatitis with abn testosterone C/o sunburn C/o sinus drainage x 1 wk  Current Medications (verified): 1)  Lisinopril 10 Mg Tabs (Lisinopril) .... 1/2 Once Daily 2)  Metformin Hcl 500 Mg Tabs (Metformin Hcl) .Marland Kitchen.. 1 Tab By Mouth Tid 3)  Metoprolol Tartrate 50 Mg Tabs (Metoprolol Tartrate) .... 1/2 Two Times A Day 4)  Tylenol Extra Strength 500 Mg Tabs (Acetaminophen) .... 2 Tab By Mouth As Needed 5)  Vitamin D 1000 Unit Tabs (Cholecalciferol) .Marland Kitchen.. 1 By Mouth Qd 6)  Vitamin B-12 500 Mcg Tabs (Cyanocobalamin) .Marland Kitchen.. 1 By Mouth Once Daily For Vitamin B12 Deficiency 7)  Onetouch Ultra Test  Strp (Glucose Blood) .... Use Once Daily Prn 8)  Wellbutrin Sr 150 Mg Xr12h-Tab (Bupropion Hcl) .Marland Kitchen.. 1 By Mouth Once Daily 9)  Alprazolam 0.5 Mg Tabs (Alprazolam) .... 1/2 - 1 By Mouth Two Times A Day As Needed Anxiety 10)  Tramadol Hcl 50 Mg Tabs (Tramadol Hcl) .Marland Kitchen.. 1-2 Tabs By Mouth Two Times A Day As Needed Pain 11)  Cyclobenzaprine Hcl 10 Mg Tabs (Cyclobenzaprine Hcl) .... 1/2-1 Tab By Mouth Two Times A Day As Needed Muscle Spasms 12)  Naproxen 500 Mg Tabs (Naproxen) .Marland Kitchen.. 1 By Mouth Two Times A Day Pc For Pain/arthritis 13)  Lipofen 150 Mg Caps (Fenofibrate) .Marland Kitchen.. 1 By Mouth Qd  Allergies (verified): 1)  ! 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    Physical Exam  General:  alert and overweight-appearing.   Head:  normocephalic and atraumatic.   Nose:  no external deformity and no nasal discharge.   Mouth:  good dentition and pharynx pink and moist.   Neck:  supple.   Lungs:  normal respiratory effort, normal breath sounds, no crackles, and no wheezes.   Heart:  normal rate, no murmur, no gallop, and no rub.   Abdomen:  soft, non-tender, and no masses.   Msk:  Lumbar-sacral spine is not  tender to palpation over paraspinal muscles and painfull with the ROM  Str leg elev neg B Cervical spine is tender  to palpation over paraspinal muscles and with the ROM   Pulses:  R and L carotid,radial,femoral,dorsalis pedis and posterior tibial pulses are full and equal bilaterally Neurologic:  alert & oriented X3 and cranial nerves II-XII intact.  MS is OK x 4 extr Skin:  L face - red   Impression & Recommendations:  Problem # 1:  HYPOGONADISM (ICD-257.2) Assessment New He declined brain MRI etc Start Rx - see meds. Med options available were discussed Risks vs benefits and controversies of a long term testost use were discussed.    Problem # 2:  OSTEOPENIA (ICD-733.90) poss related to #1 Assessment: Unchanged Treat #1  Problem # 3:  SNORING  (ICD-786.09) Assessment: Unchanged He denies OSA His updated medication list for this problem includes:    Lisinopril 10 Mg Tabs (Lisinopril) .Marland Kitchen... 1/2 once daily    Metoprolol Tartrate 50 Mg Tabs (Metoprolol tartrate) .Marland Kitchen... 1/2 two times a day  Problem # 4:  VITAMIN B12 DEFICIENCY (ICD-266.2) Assessment: Improved On the regimen of medicine(s) reflected in the chart    Problem # 5:  LIVER FUNCTION TESTS, ABNORMAL, HX OF (ICD-V12.2) due to gemfibrozil Assessment: Improved The labs were reviewed with the patient.   Problem # 6:  DIABETES MELLITUS, TYPE II (ICD-250.00) Assessment: Improved  His updated medication list for this problem includes:    Lisinopril 10 Mg Tabs (Lisinopril) .Marland Kitchen... 1/2 once daily    Metformin Hcl 500 Mg Tabs (Metformin hcl) .Marland Kitchen... 1 tab by mouth tid  Labs Reviewed: Creat: 1.1 (02/09/2011)    Reviewed HgBA1c results: 6.5 (12/08/2010)  6.7 (08/18/2010)  Problem # 7:  SINUSITIS, ACUTE (ICD-461.9) Assessment: New  His updated medication list for this problem includes:    Zithromax Z-pak 250 Mg Tabs (Azithromycin) .Marland Kitchen... As dirrected  Problem # 8:  SUNBURN (ICD-692.71) L face Assessment: New  Triamc  His updated medication list for this problem includes:    Triamcinolone Acetonide 0.5 % Crea (Triamcinolone acetonide) ..... Use two times a day prn  Complete Medication List: 1)  Lisinopril 10 Mg Tabs (Lisinopril) .... 1/2 once daily 2)  Metformin Hcl 500 Mg Tabs (Metformin hcl) .Marland Kitchen.. 1 tab by mouth tid 3)  Metoprolol Tartrate 50 Mg Tabs (Metoprolol tartrate) .... 1/2 two times a day 4)  Tylenol Extra Strength 500 Mg Tabs (Acetaminophen) .... 2 tab by mouth as needed 5)  Vitamin D 1000 Unit Tabs (Cholecalciferol) .Marland Kitchen.. 1 by mouth qd 6)  Vitamin B-12 500 Mcg Tabs (Cyanocobalamin) .Marland Kitchen.. 1 by mouth once daily for vitamin b12 deficiency 7)  Onetouch Ultra Test Strp (Glucose blood) .... Use once daily prn 8)  Wellbutrin Sr 150 Mg Xr12h-tab (Bupropion hcl) .Marland Kitchen.. 1 by  mouth once daily 9)  Alprazolam 0.5 Mg Tabs (Alprazolam) .... 1/2 - 1 by mouth two times a day as needed anxiety 10)  Lipofen 150 Mg Caps (Fenofibrate) .Marland Kitchen.. 1 by mouth qd 11)  Androgel Pump 1.6 % Gel (testosterone)  .... Use 2 pumps on skin q am 12)  Triamcinolone Acetonide 0.5 % Crea (Triamcinolone acetonide) .... Use two times a day prn 13)  Zithromax Z-pak 250 Mg Tabs (Azithromycin) .... As dirrected  Patient Instructions: 1)  Please schedule a follow-up appointment in 3 months. 2)  BMP prior to visit, ICD-9: 3)  Hepatic Panel prior to visit, ICD-9: 4)  Lipid Panel prior to visit, ICD-9: 5)  CBC w/ Diff prior to visit, ICD-9: 6)  PSA prior to visit, ICD-9: 7)  testost 257.20 Prescriptions: ZITHROMAX Z-PAK 250 MG  TABS (AZITHROMYCIN) as dirrected  #1 x 0   Entered and Authorized by:   Tresa Garter MD   Signed by:   Tresa Garter MD on 02/16/2011   Method used:   Print then Give to Patient   RxID:   [id number] TRIAMCINOLONE ACETONIDE 0.5 % CREA (TRIAMCINOLONE ACETONIDE) use two times a day prn  #45 g x 0   Entered and Authorized by:   Tresa Garter MD   Signed by:   Tresa Garter MD on 02/16/2011   Method used:   Print then Give to Patient   RxID:   [id number] ANDROGEL PUMP 1.6 % GEL (TESTOSTERONE) use 2 pumps on skin q am  #1 x 6   Entered and Authorized by:   Tresa Garter MD   Signed by:   Tresa Garter MD on 02/16/2011   Method used:   Print then Give to Patient   RxID:   [id number]    Orders Added: 1)  Est. Patient Level V [01027]

## 2011-03-05 LAB — DIFFERENTIAL
Basophils Absolute: 0.1 10*3/uL (ref 0.0–0.1)
Eosinophils Absolute: 0.2 10*3/uL (ref 0.0–0.7)
Lymphs Abs: 1.3 10*3/uL (ref 0.7–4.0)
Neutrophils Relative %: 72 % (ref 43–77)

## 2011-03-05 LAB — GLUCOSE, CAPILLARY
Glucose-Capillary: 126 mg/dL — ABNORMAL HIGH (ref 70–99)
Glucose-Capillary: 147 mg/dL — ABNORMAL HIGH (ref 70–99)

## 2011-03-05 LAB — COMPREHENSIVE METABOLIC PANEL
Alkaline Phosphatase: 111 U/L (ref 39–117)
BUN: 12 mg/dL (ref 6–23)
CO2: 27 mEq/L (ref 19–32)
Calcium: 8.9 mg/dL (ref 8.4–10.5)
GFR calc non Af Amer: >60 mL/min (ref >60)
Glucose, Bld: 103 mg/dL — ABNORMAL HIGH (ref 70–99)
Potassium: 4.2 mEq/L (ref 3.5–5.1)
Total Protein: 7.4 g/dL (ref 6.0–8.3)

## 2011-03-05 LAB — CBC
Hemoglobin: 14.8 g/dL (ref 13.0–17.0)
MCHC: 33.2 g/dL (ref 30.0–36.0)
MCV: 88.4 fL (ref 78.0–100.0)
RBC: 5.05 MIL/uL (ref 4.22–5.81)

## 2011-03-12 LAB — COMPREHENSIVE METABOLIC PANEL
ALT: 25 U/L (ref 0–53)
ALT: 33 U/L (ref 0–53)
AST: 21 U/L (ref 0–37)
Albumin: 2.2 g/dL — ABNORMAL LOW (ref 3.5–5.2)
Albumin: 2.3 g/dL — ABNORMAL LOW (ref 3.5–5.2)
Alkaline Phosphatase: 83 U/L (ref 39–117)
BUN: 5 mg/dL — ABNORMAL LOW (ref 6–23)
BUN: 6 mg/dL (ref 6–23)
BUN: 7 mg/dL (ref 6–23)
CO2: 25 mEq/L (ref 19–32)
Calcium: 8 mg/dL — ABNORMAL LOW (ref 8.4–10.5)
Calcium: 8.2 mg/dL — ABNORMAL LOW (ref 8.4–10.5)
Chloride: 104 mEq/L (ref 96–112)
Creatinine, Ser: 1.03 mg/dL (ref 0.4–1.5)
GFR calc Af Amer: >60 mL/min (ref >60)
GFR calc non Af Amer: >60 mL/min (ref >60)
Glucose, Bld: 107 mg/dL — ABNORMAL HIGH (ref 70–99)
Glucose, Bld: 112 mg/dL — ABNORMAL HIGH (ref 70–99)
Glucose, Bld: 126 mg/dL — ABNORMAL HIGH (ref 70–99)
Glucose, Bld: 148 mg/dL — ABNORMAL HIGH (ref 70–99)
Potassium: 3.5 mEq/L (ref 3.5–5.1)
Sodium: 135 mEq/L (ref 135–145)
Sodium: 135 mEq/L (ref 135–145)
Sodium: 135 mEq/L (ref 135–145)
Total Bilirubin: 0.8 mg/dL (ref 0.3–1.2)
Total Protein: 5.8 g/dL — ABNORMAL LOW (ref 6.0–8.3)
Total Protein: 6.2 g/dL (ref 6.0–8.3)

## 2011-03-12 LAB — DIFFERENTIAL
Basophils Absolute: 0.2 10*3/uL — ABNORMAL HIGH (ref 0.0–0.1)
Basophils Relative: 0 % (ref 0–1)
Eosinophils Absolute: 0.2 10*3/uL (ref 0.0–0.7)
Eosinophils Relative: 3 % (ref 0–5)
Lymphocytes Relative: 13 % (ref 12–46)
Lymphs Abs: 1.1 10*3/uL (ref 0.7–4.0)
Lymphs Abs: 1.2 10*3/uL (ref 0.7–4.0)
Monocytes Absolute: 0.8 10*3/uL (ref 0.1–1.0)
Monocytes Relative: 10 % (ref 3–12)
Monocytes Relative: 7 % (ref 3–12)
Neutro Abs: 6 10*3/uL (ref 1.7–7.7)
Neutro Abs: 6.4 10*3/uL (ref 1.7–7.7)
Neutrophils Relative %: 72 % (ref 43–77)
Neutrophils Relative %: 75 % (ref 43–77)
Neutrophils Relative %: 75 % (ref 43–77)

## 2011-03-12 LAB — GLUCOSE, CAPILLARY
Glucose-Capillary: 100 mg/dL — ABNORMAL HIGH (ref 70–99)
Glucose-Capillary: 103 mg/dL — ABNORMAL HIGH (ref 70–99)
Glucose-Capillary: 103 mg/dL — ABNORMAL HIGH (ref 70–99)
Glucose-Capillary: 106 mg/dL — ABNORMAL HIGH (ref 70–99)
Glucose-Capillary: 108 mg/dL — ABNORMAL HIGH (ref 70–99)
Glucose-Capillary: 110 mg/dL — ABNORMAL HIGH (ref 70–99)
Glucose-Capillary: 111 mg/dL — ABNORMAL HIGH (ref 70–99)
Glucose-Capillary: 112 mg/dL — ABNORMAL HIGH (ref 70–99)
Glucose-Capillary: 113 mg/dL — ABNORMAL HIGH (ref 70–99)
Glucose-Capillary: 117 mg/dL — ABNORMAL HIGH (ref 70–99)
Glucose-Capillary: 119 mg/dL — ABNORMAL HIGH (ref 70–99)
Glucose-Capillary: 120 mg/dL — ABNORMAL HIGH (ref 70–99)
Glucose-Capillary: 122 mg/dL — ABNORMAL HIGH (ref 70–99)
Glucose-Capillary: 122 mg/dL — ABNORMAL HIGH (ref 70–99)
Glucose-Capillary: 130 mg/dL — ABNORMAL HIGH (ref 70–99)
Glucose-Capillary: 138 mg/dL — ABNORMAL HIGH (ref 70–99)
Glucose-Capillary: 151 mg/dL — ABNORMAL HIGH (ref 70–99)
Glucose-Capillary: 154 mg/dL — ABNORMAL HIGH (ref 70–99)
Glucose-Capillary: 154 mg/dL — ABNORMAL HIGH (ref 70–99)
Glucose-Capillary: 163 mg/dL — ABNORMAL HIGH (ref 70–99)

## 2011-03-12 LAB — LIPASE, BLOOD
Lipase: 118 U/L — ABNORMAL HIGH (ref 11–59)
Lipase: 127 U/L — ABNORMAL HIGH (ref 11–59)
Lipase: 138 U/L — ABNORMAL HIGH (ref 11–59)
Lipase: 91 U/L — ABNORMAL HIGH (ref 11–59)
Lipase: 93 U/L — ABNORMAL HIGH (ref 11–59)

## 2011-03-12 LAB — CBC
HCT: 37.5 % — ABNORMAL LOW (ref 39.0–52.0)
HCT: 39.5 % (ref 39.0–52.0)
Hemoglobin: 12.5 g/dL — ABNORMAL LOW (ref 13.0–17.0)
Hemoglobin: 12.6 g/dL — ABNORMAL LOW (ref 13.0–17.0)
Hemoglobin: 13.3 g/dL (ref 13.0–17.0)
MCHC: 33.3 g/dL (ref 30.0–36.0)
MCHC: 33.6 g/dL (ref 30.0–36.0)
MCV: 86.5 fL (ref 78.0–100.0)
MCV: 86.9 fL (ref 78.0–100.0)
MCV: 87 fL (ref 78.0–100.0)
Platelets: 210 10*3/uL (ref 150–400)
Platelets: 221 10*3/uL (ref 150–400)
Platelets: 225 10*3/uL (ref 150–400)
Platelets: 236 10*3/uL (ref 150–400)
RBC: 4.33 MIL/uL (ref 4.22–5.81)
RBC: 4.54 MIL/uL (ref 4.22–5.81)
RDW: 13.3 % (ref 11.5–15.5)
RDW: 13.4 % (ref 11.5–15.5)
RDW: 13.5 % (ref 11.5–15.5)
WBC: 8.3 10*3/uL (ref 4.0–10.5)
WBC: 8.6 10*3/uL (ref 4.0–10.5)
WBC: 8.6 10*3/uL (ref 4.0–10.5)

## 2011-03-12 LAB — MAGNESIUM
Magnesium: 1.9 mg/dL (ref 1.5–2.5)
Magnesium: 2.1 mg/dL (ref 1.5–2.5)
Magnesium: 2.1 mg/dL (ref 1.5–2.5)

## 2011-03-12 LAB — BASIC METABOLIC PANEL
Chloride: 99 mEq/L (ref 96–112)
GFR calc Af Amer: >60 mL/min (ref >60)
Potassium: 4.1 mEq/L (ref 3.5–5.1)

## 2011-03-12 LAB — CLOSTRIDIUM DIFFICILE EIA

## 2011-03-12 LAB — AMYLASE: Amylase: 128 U/L — ABNORMAL HIGH (ref 0–105)

## 2011-03-12 LAB — HEMOGLOBIN A1C: Hgb A1c MFr Bld: 11.5 % — ABNORMAL HIGH (ref 4.6–6.1)

## 2011-03-13 LAB — DIFFERENTIAL
Basophils Absolute: 0 10*3/uL (ref 0.0–0.1)
Eosinophils Absolute: 0.1 10*3/uL (ref 0.0–0.7)
Eosinophils Relative: 1 % (ref 0–5)
Eosinophils Relative: 1 % (ref 0–5)
Lymphocytes Relative: 13 % (ref 12–46)
Lymphocytes Relative: 7 % — ABNORMAL LOW (ref 12–46)
Lymphs Abs: 0.8 10*3/uL (ref 0.7–4.0)
Lymphs Abs: 1 10*3/uL (ref 0.7–4.0)
Monocytes Relative: 10 % (ref 3–12)
Neutro Abs: 5.3 10*3/uL (ref 1.7–7.7)
Neutrophils Relative %: 73 % (ref 43–77)
Neutrophils Relative %: 88 % — ABNORMAL HIGH (ref 43–77)

## 2011-03-13 LAB — URINALYSIS, ROUTINE W REFLEX MICROSCOPIC
Bilirubin Urine: NEGATIVE
Ketones, ur: 40 mg/dL — AB
Nitrite: NEGATIVE
Protein, ur: NEGATIVE mg/dL
Urobilinogen, UA: 0.2 mg/dL (ref 0.0–1.0)

## 2011-03-13 LAB — HEPATITIS B CORE ANTIBODY, TOTAL: Hep B Core Total Ab: NEGATIVE

## 2011-03-13 LAB — GLUCOSE, CAPILLARY
Glucose-Capillary: 164 mg/dL — ABNORMAL HIGH (ref 70–99)
Glucose-Capillary: 165 mg/dL — ABNORMAL HIGH (ref 70–99)
Glucose-Capillary: 171 mg/dL — ABNORMAL HIGH (ref 70–99)
Glucose-Capillary: 210 mg/dL — ABNORMAL HIGH (ref 70–99)
Glucose-Capillary: 223 mg/dL — ABNORMAL HIGH (ref 70–99)
Glucose-Capillary: 224 mg/dL — ABNORMAL HIGH (ref 70–99)
Glucose-Capillary: 235 mg/dL — ABNORMAL HIGH (ref 70–99)
Glucose-Capillary: 254 mg/dL — ABNORMAL HIGH (ref 70–99)
Glucose-Capillary: 286 mg/dL — ABNORMAL HIGH (ref 70–99)
Glucose-Capillary: 303 mg/dL — ABNORMAL HIGH (ref 70–99)

## 2011-03-13 LAB — HEPATIC FUNCTION PANEL
ALT: 68 U/L — ABNORMAL HIGH (ref 0–53)
Albumin: 3 g/dL — ABNORMAL LOW (ref 3.5–5.2)
Alkaline Phosphatase: 101 U/L (ref 39–117)
Total Protein: 6.1 g/dL (ref 6.0–8.3)

## 2011-03-13 LAB — COMPREHENSIVE METABOLIC PANEL
ALT: 27 U/L (ref 0–53)
ALT: 42 U/L (ref 0–53)
AST: 21 U/L (ref 0–37)
AST: 93 U/L — ABNORMAL HIGH (ref 0–37)
BUN: 10 mg/dL (ref 6–23)
CO2: 21 mEq/L (ref 19–32)
CO2: 26 mEq/L (ref 19–32)
Calcium: 7.9 mg/dL — ABNORMAL LOW (ref 8.4–10.5)
Calcium: 8.5 mg/dL (ref 8.4–10.5)
Chloride: 104 mEq/L (ref 96–112)
Creatinine, Ser: 0.64 mg/dL (ref 0.4–1.5)
Creatinine, Ser: 0.91 mg/dL (ref 0.4–1.5)
GFR calc Af Amer: >60 mL/min (ref >60)
GFR calc Af Amer: >60 mL/min (ref >60)
GFR calc non Af Amer: >60 mL/min (ref >60)
GFR calc non Af Amer: >60 mL/min (ref >60)
Glucose, Bld: 172 mg/dL — ABNORMAL HIGH (ref 70–99)
Sodium: 134 mEq/L — ABNORMAL LOW (ref 135–145)
Sodium: 135 mEq/L (ref 135–145)
Total Bilirubin: 1.3 mg/dL — ABNORMAL HIGH (ref 0.3–1.2)
Total Protein: 5.6 g/dL — ABNORMAL LOW (ref 6.0–8.3)

## 2011-03-13 LAB — LIPASE, BLOOD
Lipase: 1594 U/L — ABNORMAL HIGH (ref 11–59)
Lipase: 57 U/L (ref 11–59)

## 2011-03-13 LAB — BASIC METABOLIC PANEL
Calcium: 7.9 mg/dL — ABNORMAL LOW (ref 8.4–10.5)
Creatinine, Ser: 0.84 mg/dL (ref 0.4–1.5)
GFR calc Af Amer: >60 mL/min (ref >60)
GFR calc non Af Amer: >60 mL/min (ref >60)

## 2011-03-13 LAB — T3: T3, Total: 54 ng/dl — ABNORMAL LOW (ref 80.0–204.0)

## 2011-03-13 LAB — HEMOGLOBIN A1C: Mean Plasma Glucose: 321 mg/dL

## 2011-03-13 LAB — OVA AND PARASITE EXAMINATION: Ova and parasites: NONE SEEN

## 2011-03-13 LAB — CBC
Hemoglobin: 12.5 g/dL — ABNORMAL LOW (ref 13.0–17.0)
MCHC: 33.5 g/dL (ref 30.0–36.0)
MCHC: 35 g/dL (ref 30.0–36.0)
MCV: 86.1 fL (ref 78.0–100.0)
MCV: 87.8 fL (ref 78.0–100.0)
Platelets: 175 10*3/uL (ref 150–400)
RBC: 4.47 MIL/uL (ref 4.22–5.81)
RBC: 4.87 MIL/uL (ref 4.22–5.81)
RBC: 5.24 MIL/uL (ref 4.22–5.81)
RDW: 13.6 % (ref 11.5–15.5)
WBC: 12 10*3/uL — ABNORMAL HIGH (ref 4.0–10.5)
WBC: 9.7 10*3/uL (ref 4.0–10.5)

## 2011-03-13 LAB — LIPID PANEL
HDL: 24 mg/dL — ABNORMAL LOW (ref >39)
Triglycerides: 397 mg/dL — ABNORMAL HIGH (ref <150)
VLDL: 79 mg/dL — ABNORMAL HIGH (ref 0–40)

## 2011-03-13 LAB — FECAL LACTOFERRIN, QUANT: Fecal Lactoferrin: POSITIVE

## 2011-03-13 LAB — HEPATITIS A ANTIBODY, TOTAL: Hep A Total Ab: NEGATIVE

## 2011-03-13 LAB — CLOSTRIDIUM DIFFICILE EIA

## 2011-03-13 LAB — HEPATITIS C ANTIBODY: HCV Ab: NEGATIVE

## 2011-03-13 LAB — MAGNESIUM: Magnesium: 1.9 mg/dL (ref 1.5–2.5)

## 2011-04-03 ENCOUNTER — Other Ambulatory Visit: Payer: Self-pay | Admitting: Internal Medicine

## 2011-05-18 ENCOUNTER — Other Ambulatory Visit: Payer: Self-pay | Admitting: Internal Medicine

## 2011-05-18 ENCOUNTER — Other Ambulatory Visit: Payer: 59

## 2011-05-18 DIAGNOSIS — E291 Testicular hypofunction: Secondary | ICD-10-CM

## 2011-05-25 ENCOUNTER — Ambulatory Visit: Payer: 59 | Admitting: Internal Medicine

## 2011-07-05 ENCOUNTER — Other Ambulatory Visit: Payer: Self-pay | Admitting: Internal Medicine

## 2011-07-05 ENCOUNTER — Other Ambulatory Visit: Payer: Self-pay | Admitting: *Deleted

## 2011-07-05 MED ORDER — METOPROLOL TARTRATE 50 MG PO TABS: 25.0000 mg | ORAL_TABLET | Freq: Two times a day (BID) | ORAL | Status: DC

## 2011-07-05 MED ORDER — LISINOPRIL 10 MG PO TABS: 5.0000 mg | ORAL_TABLET | Freq: Every day | ORAL | Status: DC

## 2011-08-03 ENCOUNTER — Other Ambulatory Visit: Payer: 59

## 2011-08-10 ENCOUNTER — Ambulatory Visit (INDEPENDENT_AMBULATORY_CARE_PROVIDER_SITE_OTHER): Payer: 59

## 2011-08-10 ENCOUNTER — Ambulatory Visit: Payer: 59 | Admitting: Internal Medicine

## 2011-08-10 ENCOUNTER — Other Ambulatory Visit: Payer: Self-pay | Admitting: Internal Medicine

## 2011-08-10 DIAGNOSIS — E291 Testicular hypofunction: Secondary | ICD-10-CM

## 2011-08-10 LAB — CBC WITH DIFFERENTIAL/PLATELET
Basophils Relative: 0.9 % (ref 0.0–3.0)
Eosinophils Relative: 5.3 % — ABNORMAL HIGH (ref 0.0–5.0)
HCT: 42.8 % (ref 39.0–52.0)
Hemoglobin: 14.4 g/dL (ref 13.0–17.0)
Lymphs Abs: 1.8 10*3/uL (ref 0.7–4.0)
MCV: 87.8 fl (ref 78.0–100.0)
Monocytes Absolute: 0.7 10*3/uL (ref 0.1–1.0)
Monocytes Relative: 9.5 % (ref 3.0–12.0)
Neutro Abs: 4.2 10*3/uL (ref 1.4–7.7)
Platelets: 242 10*3/uL (ref 150.0–400.0)
WBC: 7.2 10*3/uL (ref 4.5–10.5)

## 2011-08-10 LAB — PSA: PSA: 1.41 ng/mL (ref 0.10–4.00)

## 2011-08-10 LAB — HEPATIC FUNCTION PANEL
ALT: 22 U/L (ref 0–53)
AST: 23 U/L (ref 0–37)
Bilirubin, Direct: 0.1 mg/dL (ref 0.0–0.3)
Total Protein: 6.9 g/dL (ref 6.0–8.3)

## 2011-08-10 LAB — BASIC METABOLIC PANEL
BUN: 21 mg/dL (ref 6–23)
CO2: 28 mEq/L (ref 19–32)
Chloride: 106 mEq/L (ref 96–112)
Creatinine, Ser: 1.2 mg/dL (ref 0.4–1.5)
Potassium: 4.8 mEq/L (ref 3.5–5.1)

## 2011-08-10 LAB — LIPID PANEL
Total CHOL/HDL Ratio: 7
Triglycerides: 362 mg/dL — ABNORMAL HIGH (ref 0.0–149.0)

## 2011-08-17 ENCOUNTER — Ambulatory Visit (INDEPENDENT_AMBULATORY_CARE_PROVIDER_SITE_OTHER): Payer: 59 | Admitting: Internal Medicine

## 2011-08-17 ENCOUNTER — Encounter: Payer: Self-pay | Admitting: Internal Medicine

## 2011-08-17 VITALS — BP 138/90 | HR 72 | Temp 98.4°F | Resp 16 | Wt 268.0 lb

## 2011-08-17 DIAGNOSIS — R0989 Other specified symptoms and signs involving the circulatory and respiratory systems: Secondary | ICD-10-CM

## 2011-08-17 DIAGNOSIS — Z23 Encounter for immunization: Secondary | ICD-10-CM

## 2011-08-17 DIAGNOSIS — R0609 Other forms of dyspnea: Secondary | ICD-10-CM

## 2011-08-17 MED ORDER — METOPROLOL TARTRATE 50 MG PO TABS: 25.0000 mg | ORAL_TABLET | Freq: Two times a day (BID) | ORAL | Status: DC

## 2011-08-17 MED ORDER — BUPROPION HCL ER (SR) 150 MG PO TB12: 150.0000 mg | ORAL_TABLET | Freq: Every day | ORAL | Status: DC

## 2011-08-17 MED ORDER — ALPRAZOLAM 0.5 MG PO TABS: 0.5100 mg | ORAL_TABLET | Freq: Two times a day (BID) | ORAL | Status: DC | PRN

## 2011-08-17 MED ORDER — TRIAMCINOLONE ACETONIDE 0.5 % EX CREA: 1.0000 "application " | TOPICAL_CREAM | Freq: Two times a day (BID) | CUTANEOUS | Status: DC

## 2011-08-17 MED ORDER — LISINOPRIL 10 MG PO TABS: 5.0000 mg | ORAL_TABLET | Freq: Every day | ORAL | Status: DC

## 2011-08-17 MED ORDER — DOXYCYCLINE HYCLATE 100 MG PO TABS: 100.0000 mg | ORAL_TABLET | Freq: Two times a day (BID) | ORAL | Status: AC

## 2011-08-17 MED ORDER — TESTOSTERONE 20.25 MG/ACT (1.62%) TD GEL: 2.0000 | TRANSDERMAL | Status: DC

## 2011-08-17 MED ORDER — FENOFIBRATE 150 MG PO CAPS: 1.0000 | ORAL_CAPSULE | Freq: Every day | ORAL | Status: DC

## 2011-08-17 MED ORDER — METFORMIN HCL 500 MG PO TABS: 500.0000 mg | ORAL_TABLET | Freq: Three times a day (TID) | ORAL | Status: DC

## 2011-08-17 NOTE — Progress Notes (Signed)
  Subjective:    Patient ID: Kevin Miller, male    DOB: 12/02/1961, 51 y.o.   MRN: [id number]  HPI  The patient presents for a follow-up of  chronic hypertension, chronic dyslipidemia, type 2 diabetes controlled with medicines, hypogonadism.  C/o lip cracked. Co fatigue - worse. He put wt on. He was using testosterone 3/wk.  Review of Systems  Constitutional: Positive for fatigue. Negative for appetite change and unexpected weight change.  HENT: Negative for nosebleeds, congestion, sore throat, sneezing, trouble swallowing and neck pain.   Eyes: Negative for itching and visual disturbance.  Respiratory: Negative for cough.   Cardiovascular: Negative for chest pain, palpitations and leg swelling.  Gastrointestinal: Negative for nausea, diarrhea, blood in stool and abdominal distention.  Genitourinary: Negative for frequency and hematuria.  Musculoskeletal: Negative for back pain, joint swelling and gait problem.  Skin: Negative for rash.  Neurological: Negative for dizziness, tremors, speech difficulty and weakness.  Psychiatric/Behavioral: Negative for suicidal ideas, sleep disturbance, dysphoric mood and agitation. The patient is nervous/anxious.    Wt Readings from Last 3 Encounters:  08/17/11 268 lb (121.564 kg)  02/16/11 254 lb (115.214 kg)  12/15/10 258 lb (117.028 kg)       Objective:   Physical Exam  Constitutional: He is oriented to person, place, and time. He appears well-developed.       obese  HENT:  Mouth/Throat: Oropharynx is clear and moist.  Eyes: Conjunctivae are normal. Pupils are equal, round, and reactive to light.  Neck: Normal range of motion. No JVD present. No thyromegaly present.  Cardiovascular: Normal rate, regular rhythm, normal heart sounds and intact distal pulses.  Exam reveals no gallop and no friction rub.   No murmur heard. Pulmonary/Chest: Effort normal and breath sounds normal. No respiratory distress. He has no wheezes. He has no rales.  He exhibits no tenderness.  Abdominal: Soft. Bowel sounds are normal. He exhibits no distension and no mass. There is no tenderness. There is no rebound and no guarding.  Musculoskeletal: Normal range of motion. He exhibits no edema and no tenderness.  Lymphadenopathy:    He has no cervical adenopathy.  Neurological: He is alert and oriented to person, place, and time. He has normal reflexes. No cranial nerve deficit. He exhibits normal muscle tone. Coordination normal.  Skin: Skin is warm and dry. No rash noted.  Psychiatric: He has a normal mood and affect. His behavior is normal. Judgment and thought content normal.   Lip fissure   Lab Results  Component Value Date   WBC 7.2 08/10/2011   HGB 14.4 08/10/2011   HCT 42.8 08/10/2011   PLT 242.0 08/10/2011   CHOL 205* 08/10/2011   TRIG 362.0* 08/10/2011   HDL 31.30* 08/10/2011   LDLDIRECT 122.8 08/10/2011   ALT 22 08/10/2011   AST 23 08/10/2011   NA 139 08/10/2011   K 4.8 08/10/2011   CL 106 08/10/2011   CREATININE 1.2 08/10/2011   BUN 21 08/10/2011   CO2 28 08/10/2011   TSH 1.95 12/08/2010   PSA 1.41 08/10/2011   HGBA1C 6.5 12/08/2010       Assessment & Plan:

## 2011-08-17 NOTE — Assessment & Plan Note (Signed)
Declined sleep test

## 2011-09-18 ENCOUNTER — Ambulatory Visit (INDEPENDENT_AMBULATORY_CARE_PROVIDER_SITE_OTHER): Payer: 59 | Admitting: Internal Medicine

## 2011-09-18 ENCOUNTER — Encounter: Payer: Self-pay | Admitting: Internal Medicine

## 2011-09-18 VITALS — BP 140/82 | HR 76 | Temp 98.0°F | Resp 16 | Wt 264.0 lb

## 2011-09-18 DIAGNOSIS — I10 Essential (primary) hypertension: Secondary | ICD-10-CM

## 2011-09-18 DIAGNOSIS — R9431 Abnormal electrocardiogram [ECG] [EKG]: Secondary | ICD-10-CM

## 2011-09-18 MED ORDER — LISINOPRIL 10 MG PO TABS: 10.0000 mg | ORAL_TABLET | Freq: Every day | ORAL | Status: DC

## 2011-09-18 NOTE — Progress Notes (Signed)
  Subjective:    Patient ID: Kevin Miller, male    DOB: March 28, 1961, 50 y.o.   MRN: [id number]  HPI C/o abn EKG at [facility] on [address] yesterday F/u HTN   Review of Systems  Respiratory: Negative for cough, chest tightness and wheezing.   Cardiovascular: Negative for chest pain and leg swelling.       Objective:   Physical Exam  Constitutional: He is oriented to person, place, and time. He appears well-developed.  HENT:  Mouth/Throat: Oropharynx is clear and moist.  Eyes: Conjunctivae are normal. Pupils are equal, round, and reactive to light.  Neck: Normal range of motion. No JVD present. No thyromegaly present.  Cardiovascular: Normal rate, regular rhythm, normal heart sounds and intact distal pulses.  Exam reveals no gallop and no friction rub.   No murmur heard. Pulmonary/Chest: Effort normal and breath sounds normal. No respiratory distress. He has no wheezes. He has no rales. He exhibits no tenderness.  Abdominal: Soft. Bowel sounds are normal. He exhibits no distension and no mass. There is no tenderness. There is no rebound and no guarding.  Musculoskeletal: Normal range of motion. He exhibits no edema and no tenderness.  Lymphadenopathy:    He has no cervical adenopathy.  Neurological: He is alert and oriented to person, place, and time. He has normal reflexes. No cranial nerve deficit. He exhibits normal muscle tone. Coordination normal.  Skin: Skin is warm and dry. No rash noted.  Psychiatric: He has a normal mood and affect. His behavior is normal. Judgment and thought content normal.          Assessment & Plan:

## 2011-09-18 NOTE — Assessment & Plan Note (Signed)
Increase Lisinopril to 10 mg/d

## 2011-09-18 NOTE — Patient Instructions (Signed)
Normal BP < 130/85  BP Readings from Last 3 Encounters:  09/18/11 140/82  08/17/11 138/90  02/16/11 110/80    Wt Readings from Last 3 Encounters:  09/18/11 264 lb (119.75 kg)  08/17/11 268 lb (121.564 kg)  02/16/11 254 lb (115.214 kg)

## 2011-09-19 ENCOUNTER — Encounter: Payer: Self-pay | Admitting: Internal Medicine

## 2011-09-19 NOTE — Assessment & Plan Note (Signed)
Abn EKG at [facility] on 10/8 ([facility]) Our EKG here is WNL

## 2011-11-23 ENCOUNTER — Other Ambulatory Visit: Payer: Self-pay | Admitting: Internal Medicine

## 2011-12-28 ENCOUNTER — Ambulatory Visit: Payer: 59 | Admitting: Internal Medicine

## 2012-06-10 ENCOUNTER — Other Ambulatory Visit: Payer: Self-pay | Admitting: Internal Medicine

## 2012-07-23 ENCOUNTER — Other Ambulatory Visit: Payer: Self-pay | Admitting: Internal Medicine

## 2012-09-08 ENCOUNTER — Other Ambulatory Visit: Payer: Self-pay | Admitting: *Deleted

## 2012-09-08 MED ORDER — LISINOPRIL 10 MG PO TABS: 10.0000 mg | ORAL_TABLET | Freq: Every day | ORAL | Status: DC

## 2012-09-09 ENCOUNTER — Other Ambulatory Visit: Payer: Self-pay | Admitting: Internal Medicine

## 2012-09-23 ENCOUNTER — Encounter: Payer: Self-pay | Admitting: Gastroenterology

## 2012-10-23 ENCOUNTER — Other Ambulatory Visit: Payer: Self-pay | Admitting: Internal Medicine

## 2012-10-24 ENCOUNTER — Telehealth: Payer: Self-pay | Admitting: Internal Medicine

## 2012-10-24 DIAGNOSIS — Z0000 Encounter for general adult medical examination without abnormal findings: Secondary | ICD-10-CM

## 2012-10-24 DIAGNOSIS — Z0389 Encounter for observation for other suspected diseases and conditions ruled out: Secondary | ICD-10-CM

## 2012-10-24 NOTE — Telephone Encounter (Signed)
Patient made an apt for December 9th, but he will be out of the metformin and bupropion by then and he wants a refill to last if possible  Also he would like to know if he needs to come in for labs before his apt

## 2012-10-24 NOTE — Telephone Encounter (Signed)
Labs entered.

## 2012-10-27 NOTE — Telephone Encounter (Signed)
Labs entered. Rf were sent.

## 2012-11-10 ENCOUNTER — Other Ambulatory Visit (INDEPENDENT_AMBULATORY_CARE_PROVIDER_SITE_OTHER): Payer: 59

## 2012-11-10 DIAGNOSIS — Z0389 Encounter for observation for other suspected diseases and conditions ruled out: Secondary | ICD-10-CM

## 2012-11-10 DIAGNOSIS — Z0000 Encounter for general adult medical examination without abnormal findings: Secondary | ICD-10-CM

## 2012-11-10 LAB — URINALYSIS, ROUTINE W REFLEX MICROSCOPIC
Bilirubin Urine: NEGATIVE
Hgb urine dipstick: NEGATIVE
Ketones, ur: NEGATIVE
Leukocytes, UA: NEGATIVE
Nitrite: NEGATIVE
Total Protein, Urine: NEGATIVE

## 2012-11-10 LAB — CBC WITH DIFFERENTIAL/PLATELET
Basophils Relative: 0.8 % (ref 0.0–3.0)
Eosinophils Relative: 4.6 % (ref 0.0–5.0)
HCT: 42.9 % (ref 39.0–52.0)
Hemoglobin: 14.4 g/dL (ref 13.0–17.0)
Lymphocytes Relative: 27.1 % (ref 12.0–46.0)
Monocytes Relative: 11 % (ref 3.0–12.0)
Neutro Abs: 3.5 10*3/uL (ref 1.4–7.7)
RBC: 4.93 Mil/uL (ref 4.22–5.81)

## 2012-11-10 LAB — TSH: TSH: 1.17 u[IU]/mL (ref 0.35–5.50)

## 2012-11-10 LAB — BASIC METABOLIC PANEL
BUN: 23 mg/dL (ref 6–23)
Chloride: 104 mEq/L (ref 96–112)
Glucose, Bld: 132 mg/dL — ABNORMAL HIGH (ref 70–99)
Potassium: 4.9 mEq/L (ref 3.5–5.1)
Sodium: 138 mEq/L (ref 135–145)

## 2012-11-10 LAB — HEPATIC FUNCTION PANEL
ALT: 22 U/L (ref 0–53)
AST: 20 U/L (ref 0–37)
Albumin: 4.1 g/dL (ref 3.5–5.2)
Alkaline Phosphatase: 63 U/L (ref 39–117)

## 2012-11-10 LAB — PSA: PSA: 1.58 ng/mL (ref 0.10–4.00)

## 2012-11-17 ENCOUNTER — Ambulatory Visit (INDEPENDENT_AMBULATORY_CARE_PROVIDER_SITE_OTHER): Payer: 59 | Admitting: Internal Medicine

## 2012-11-17 ENCOUNTER — Encounter: Payer: Self-pay | Admitting: Internal Medicine

## 2012-11-17 VITALS — BP 130/94 | Temp 97.5°F | Wt 262.0 lb

## 2012-11-17 DIAGNOSIS — F341 Dysthymic disorder: Secondary | ICD-10-CM

## 2012-11-17 DIAGNOSIS — L570 Actinic keratosis: Secondary | ICD-10-CM

## 2012-11-17 DIAGNOSIS — E119 Type 2 diabetes mellitus without complications: Secondary | ICD-10-CM

## 2012-11-17 DIAGNOSIS — E538 Deficiency of other specified B group vitamins: Secondary | ICD-10-CM

## 2012-11-17 DIAGNOSIS — Z23 Encounter for immunization: Secondary | ICD-10-CM

## 2012-11-17 DIAGNOSIS — M25629 Stiffness of unspecified elbow, not elsewhere classified: Secondary | ICD-10-CM

## 2012-11-17 DIAGNOSIS — I10 Essential (primary) hypertension: Secondary | ICD-10-CM

## 2012-11-17 MED ORDER — METOPROLOL TARTRATE 50 MG PO TABS: 25.0000 mg | ORAL_TABLET | Freq: Two times a day (BID) | ORAL | Status: DC

## 2012-11-17 MED ORDER — LISINOPRIL 10 MG PO TABS: 10.0000 mg | ORAL_TABLET | Freq: Every day | ORAL | Status: DC

## 2012-11-17 MED ORDER — BUPROPION HCL ER (SR) 150 MG PO TB12: 150.0000 mg | ORAL_TABLET | Freq: Every morning | ORAL | Status: DC

## 2012-11-17 MED ORDER — FENOFIBRATE 150 MG PO CAPS: ORAL_CAPSULE | ORAL | Status: DC

## 2012-11-17 MED ORDER — METFORMIN HCL 500 MG PO TABS: 500.0000 mg | ORAL_TABLET | Freq: Three times a day (TID) | ORAL | Status: DC

## 2012-11-17 MED ORDER — PANCRELIPASE (LIP-PROT-AMYL) 24000-76000 UNITS PO CPEP: ORAL_CAPSULE | ORAL | Status: DC

## 2012-11-17 NOTE — Assessment & Plan Note (Signed)
Continue with current prescription therapy as reflected on the Med list.  

## 2012-11-17 NOTE — Patient Instructions (Signed)
   Postprocedure instructions :     Keep the wounds clean. You can wash them with liquid soap and water. Pat dry with gauze or a Kleenex tissue  Before applying antibiotic ointment and a Band-Aid.   You need to report immediately  if  any signs of infection develop.    

## 2012-11-17 NOTE — Progress Notes (Signed)
Subjective:    Patient ID: Kevin Miller, male    DOB: 1961/05/12, 51 y.o.   MRN: [id number]  HPI  The patient presents for a follow-up of  chronic hypertension, chronic dyslipidemia, type 2 diabetes controlled with medicines, hypogonadism.  C/o lip cracked. Co fatigue - worse. He put wt on. He was not using testosterone 3/wk.  Review of Systems  Constitutional: Positive for fatigue. Negative for appetite change and unexpected weight change.  HENT: Negative for nosebleeds, congestion, sore throat, sneezing, trouble swallowing and neck pain.   Eyes: Negative for itching and visual disturbance.  Respiratory: Negative for cough.   Cardiovascular: Negative for chest pain, palpitations and leg swelling.  Gastrointestinal: Negative for nausea, diarrhea, blood in stool and abdominal distention.  Genitourinary: Negative for frequency and hematuria.  Musculoskeletal: Negative for back pain, joint swelling and gait problem.  Skin: Negative for rash.  Neurological: Negative for dizziness, tremors, speech difficulty and weakness.  Psychiatric/Behavioral: Negative for suicidal ideas, sleep disturbance, dysphoric mood and agitation. The patient is nervous/anxious.    Wt Readings from Last 3 Encounters:  11/17/12 262 lb (118.842 kg)  09/18/11 264 lb (119.75 kg)  08/17/11 268 lb (121.564 kg)   BP Readings from Last 3 Encounters:  11/17/12 130/94  09/18/11 140/82  08/17/11 138/90       Objective:   Physical Exam  Constitutional: He is oriented to person, place, and time. He appears well-developed.       obese  HENT:  Mouth/Throat: Oropharynx is clear and moist.  Eyes: Conjunctivae normal are normal. Pupils are equal, round, and reactive to light.  Neck: Normal range of motion. No JVD present. No thyromegaly present.  Cardiovascular: Normal rate, regular rhythm, normal heart sounds and intact distal pulses.  Exam reveals no gallop and no friction rub.   No murmur  heard. Pulmonary/Chest: Effort normal and breath sounds normal. No respiratory distress. He has no wheezes. He has no rales. He exhibits no tenderness.  Abdominal: Soft. Bowel sounds are normal. He exhibits no distension and no mass. There is no tenderness. There is no rebound and no guarding.  Musculoskeletal: Normal range of motion. He exhibits no edema and no tenderness.  Lymphadenopathy:    He has no cervical adenopathy.  Neurological: He is alert and oriented to person, place, and time. He has normal reflexes. No cranial nerve deficit. He exhibits normal muscle tone. Coordination normal.  Skin: Skin is warm and dry. No rash noted.  Psychiatric: He has a normal mood and affect. His behavior is normal. Judgment and thought content normal.  AKs x3 L face   Lab Results  Component Value Date   WBC 6.2 11/10/2012   HGB 14.4 11/10/2012   HCT 42.9 11/10/2012   PLT 245.0 11/10/2012   CHOL 193 11/10/2012   TRIG 270.0* 11/10/2012   HDL 30.40* 11/10/2012   LDLDIRECT 123.6 11/10/2012   ALT 22 11/10/2012   AST 20 11/10/2012   NA 138 11/10/2012   K 4.9 11/10/2012   CL 104 11/10/2012   CREATININE 1.3 11/10/2012   BUN 23 11/10/2012   CO2 27 11/10/2012   TSH 1.17 11/10/2012   PSA 1.58 11/10/2012   HGBA1C 6.5 12/08/2010    Procedure Note :     Procedure : Cryosurgery   Indication:  Wart(s)  Actinic keratosis(es)   Risks including unsuccessful procedure , bleeding, infection, bruising, scar, a need for a repeat  procedure and others were explained to the patient in detail as well as  the benefits. Informed consent was obtained verbally.    3 lesion(s)  on  Face was/were treated with liquid nitrogen on a Q-tip in a usual fasion . Band-Aid was applied and antibiotic ointment was given for a later use.   Tolerated well. Complications none.   Postprocedure instructions :     Keep the wounds clean. You can wash them with liquid soap and water. Pat dry with gauze or a Kleenex tissue  Before applying  antibiotic ointment and a Band-Aid.   You need to report immediately  if  any signs of infection develop.       Assessment & Plan:

## 2012-11-17 NOTE — Assessment & Plan Note (Signed)
See procedure 

## 2012-11-17 NOTE — Assessment & Plan Note (Signed)
Dr. Gramig 

## 2012-11-18 ENCOUNTER — Encounter: Payer: Self-pay | Admitting: Internal Medicine

## 2013-02-20 ENCOUNTER — Ambulatory Visit: Payer: 59 | Admitting: Internal Medicine

## 2013-04-06 ENCOUNTER — Encounter: Payer: Self-pay | Admitting: Gastroenterology

## 2013-04-10 ENCOUNTER — Telehealth: Payer: Self-pay | Admitting: *Deleted

## 2013-04-10 NOTE — Telephone Encounter (Signed)
Lipofen PA is approved 04/07/13-04/08/15. Pharmacy informed.

## 2013-05-19 ENCOUNTER — Other Ambulatory Visit: Payer: Self-pay | Admitting: Internal Medicine

## 2013-08-24 ENCOUNTER — Other Ambulatory Visit: Payer: Self-pay | Admitting: Internal Medicine

## 2013-08-29 ENCOUNTER — Other Ambulatory Visit: Payer: Self-pay | Admitting: Internal Medicine

## 2013-11-23 ENCOUNTER — Other Ambulatory Visit: Payer: Self-pay | Admitting: Internal Medicine

## 2013-12-20 ENCOUNTER — Other Ambulatory Visit: Payer: Self-pay | Admitting: Internal Medicine

## 2014-01-03 ENCOUNTER — Other Ambulatory Visit: Payer: Self-pay | Admitting: Internal Medicine

## 2014-09-01 ENCOUNTER — Other Ambulatory Visit: Payer: Self-pay | Admitting: Internal Medicine

## 2014-09-16 ENCOUNTER — Ambulatory Visit (INDEPENDENT_AMBULATORY_CARE_PROVIDER_SITE_OTHER): Payer: 59 | Admitting: Family

## 2014-09-16 ENCOUNTER — Encounter: Payer: Self-pay | Admitting: Family

## 2014-09-16 ENCOUNTER — Other Ambulatory Visit (INDEPENDENT_AMBULATORY_CARE_PROVIDER_SITE_OTHER): Payer: 59

## 2014-09-16 VITALS — BP 142/100 | HR 87 | Temp 98.5°F | Ht 73.0 in | Wt 263.8 lb

## 2014-09-16 DIAGNOSIS — Z23 Encounter for immunization: Secondary | ICD-10-CM

## 2014-09-16 DIAGNOSIS — E1165 Type 2 diabetes mellitus with hyperglycemia: Secondary | ICD-10-CM

## 2014-09-16 DIAGNOSIS — I10 Essential (primary) hypertension: Secondary | ICD-10-CM

## 2014-09-16 DIAGNOSIS — IMO0002 Reserved for concepts with insufficient information to code with codable children: Secondary | ICD-10-CM

## 2014-09-16 LAB — BASIC METABOLIC PANEL
BUN: 22 mg/dL (ref 6–23)
CHLORIDE: 100 meq/L (ref 96–112)
CO2: 27 meq/L (ref 19–32)
Calcium: 8.8 mg/dL (ref 8.4–10.5)
Creatinine, Ser: 1 mg/dL (ref 0.4–1.5)
GFR: 83.41 mL/min (ref >60.00)
GLUCOSE: 231 mg/dL — AB (ref 70–99)
Potassium: 4.6 mEq/L (ref 3.5–5.1)
Sodium: 132 mEq/L — ABNORMAL LOW (ref 135–145)

## 2014-09-16 LAB — LIPID PANEL
CHOLESTEROL: 288 mg/dL — AB (ref 0–200)
HDL: 19.7 mg/dL — AB (ref >39.00)
NonHDL: 268.3
Total CHOL/HDL Ratio: 15
Triglycerides: 1552 mg/dL — ABNORMAL HIGH (ref 0.0–149.0)
VLDL: 310.4 mg/dL — ABNORMAL HIGH (ref 0.0–40.0)

## 2014-09-16 LAB — LDL CHOLESTEROL, DIRECT: Direct LDL: 39.7 mg/dL

## 2014-09-16 LAB — HEMOGLOBIN A1C: HEMOGLOBIN A1C: 11.9 % — AB (ref 4.6–6.5)

## 2014-09-16 MED ORDER — GLIMEPIRIDE 2 MG PO TABS: 2.0000 mg | ORAL_TABLET | Freq: Every day | ORAL | Status: DC

## 2014-09-16 MED ORDER — METOPROLOL TARTRATE 50 MG PO TABS: 50.0000 mg | ORAL_TABLET | Freq: Two times a day (BID) | ORAL | Status: DC

## 2014-09-16 MED ORDER — GLUCOSE BLOOD VI STRP: ORAL_STRIP | Status: DC

## 2014-09-16 NOTE — Assessment & Plan Note (Addendum)
Fasting blood sugars are reported to be in 200s. Obtain A1c, BMET and lipid panel. Continue Metformin and add Amaryl. Diabetic foot exam completed. Continue to test blood sugar in AM and occasionally at night. Pt will be scheduling his diabetic eye exam. Received flu shot today. Will follow up in 3 months.

## 2014-09-16 NOTE — Assessment & Plan Note (Signed)
Blood pressure remains above goal. Continue lisinopril and increase Metoprolol to 100 mg daily. Will schedule follow-up in 2 weeks.

## 2014-09-16 NOTE — Progress Notes (Signed)
Subjective:    Patient ID: Kevin Miller, male    DOB: 1961-05-16, 53 y.o.   MRN: [id number]  HPI:  Kevin Miller is a 53 y.o. male who presents today for his blood sugar and blood pressure   1) Diabetes - . He is currently maintained on metformin. 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 Home readings have been ranging in the area 200's. Noticed increase urination prior to event. Denies changes of vision, polydipsia, or polyphagia.   Last HgA1c was taken in 2011.  Lab Results  Component Value Date   HGBA1C 6.5 12/08/2010     2) HTN - Currently taking metoprolol, lisinpril. Denies any chest pain/discomfort, shortness of breath, or palpitations.  BP Readings from Last 3 Encounters:  09/16/14 142/100  11/17/12 130/94  09/18/11 140/82   3) Requesting flu shot today.   Allergies  Allergen Reactions  . Gemfibrozil     REACTION: abn LFTs   Current outpatient prescriptions:acetaminophen (TYLENOL) 500 MG tablet, Take 1,000 mg by mouth every 6 (six) hours as needed.  , Disp: , Rfl: ;  buPROPion (WELLBUTRIN SR) 150 MG 12 hr tablet, TAKE 1 TABLET (150 MG TOTAL) BY MOUTH EVERY MORNING., Disp: 30 tablet, Rfl: 0;  Cholecalciferol 1000 UNITS tablet, Take 1,000 Units by mouth daily.  , Disp: , Rfl: ;  cyanocobalamin 500 MCG tablet, Take 500 mcg by mouth daily.  , Disp: , Rfl:  glucose blood (ONE TOUCH ULTRA TEST) test strip, USE ONCE DAILY AS NEEDED, Disp: 50 each, Rfl: 3;  LIPOFEN 150 MG CAPS, TAKE 1 CAPSULE BY MOUTH DAILY, Disp: 90 capsule, Rfl: 0;  lisinopril (PRINIVIL,ZESTRIL) 10 MG tablet, TAKE 1 TABLET (10 MG TOTAL) BY MOUTH DAILY., Disp: 90 tablet, Rfl: 3;  metFORMIN (GLUCOPHAGE) 500 MG tablet, TAKE 1 TABLET (500 MG TOTAL) BY MOUTH 3 (THREE) TIMES DAILY., Disp: 90 tablet, Rfl: 0 Testosterone (ANDROGEL PUMP) 20.25 MG/ACT (1.62%) GEL, Place 2 Squirts onto the skin every morning., Disp: 225 g, Rfl: 1;  glimepiride (AMARYL) 2 MG tablet, Take 1 tablet (2 mg  total) by mouth daily before breakfast., Disp: 30 tablet, Rfl: 3;  metoprolol (LOPRESSOR) 50 MG tablet, Take 1 tablet (50 mg total) by mouth 2 (two) times daily., Disp: 60 tablet, Rfl: 0   Past Medical History  Diagnosis Date  . Diabetes mellitus   . Hypertension   . Hyperlipidemia   . Pancreatitis 2010    ? etiol  . Pancreatic pseudocyst     Resolved  . Clostridium difficile infection 2010  . Vitamin B 12 deficiency   . Elevated LFTs 2011  . Fatty liver   . Osteopenia 2011  . Neck pain     Left  . Hypogonadism male 2012    Review of Systems  See HPI.     Objective:    BP 142/100  Pulse 87  Temp(Src) 98.5 F (36.9 C) (Oral)  Ht 6\' 1"  (1.854 m)  Wt 263 lb 12.8 oz (119.659 kg)  BMI 34.81 kg/m2  SpO2 95%  Nursing note and vital signs reviewed.  Physical Exam  Constitutional: He is oriented to person, place, and time. He appears well-developed and well-nourished. No distress.  Cardiovascular: Normal rate, regular rhythm and normal heart sounds.   Pulmonary/Chest: Effort normal and breath sounds normal.  Neurological: He is alert and oriented to person, place, and time.  Skin: Skin is warm and dry.  Psychiatric: He has a normal mood and  affect. His behavior is normal. Judgment and thought content normal.      Assessment & Plan:

## 2014-09-16 NOTE — Progress Notes (Signed)
Pre visit review using our clinic review tool, if applicable. No additional management support is needed unless otherwise documented below in the visit note. 

## 2014-09-16 NOTE — Patient Instructions (Signed)
Thank you for choosing [facility].  Summary/Instructions:   Please stop by the lab before leaving for your blood work.  Your medications have been sent to your pharmacy.  Please schedule a follow-up appointment for 2 weeks to recheck your blood pressure.   We will contact you with your results.  Thank you for enrolling in [facility]. Please follow the instructions below to securely access your online medical record. MyChart allows you to send messages to your doctor, view your test results, renew your prescriptions, schedule appointments, and more.  How Do I Sign Up? 1. In your Internet browser, go to [email or web address] 2. Click on the New  User? link in the Sign In box.  3. Enter your MyChart Access Code exactly as it appears below. You will not need to use this code after you have completed the sign-up process. If you do not sign up before the expiration date, you must request a new code. MyChart Access Code: [email or web address] Expires: 11/15/2014  8:46 AM  4. Enter the last four digits of your Social Security Number (xxxx) and Date of Birth (mm/dd/yyyy) as indicated and click Next. You will be taken to the next sign-up page. 5. Create a MyChart ID. This will be your MyChart login ID and cannot be changed, so think of one that is secure and easy to remember. 6. Create a MyChart password. You can change your password at any time. 7. Enter your Password Reset Question and Answer and click Next. This can be used at a later time if you forget your password.  8. Select your communication preference, and if applicable enter your e-mail address. You will receive e-mail notification when new information is available in MyChart by choosing to receive e-mail notifications and filling in your e-mail. 9. Click Sign In. You can now view your medical record.   Additional Information If you have questions, you can email [email or web address] or call [phone] to talk to our  [facility] staff. Remember, MyChart is NOT to be used for urgent needs. For medical emergencies, dial 911.

## 2014-09-17 ENCOUNTER — Telehealth: Payer: Self-pay

## 2014-09-17 ENCOUNTER — Telehealth: Payer: Self-pay | Admitting: Internal Medicine

## 2014-09-17 NOTE — Telephone Encounter (Signed)
Called pts cell phone 3 times. I received a busy signal. Tried calling home phone and work phone but could not get through. Sent results through the mail.

## 2014-09-17 NOTE — Telephone Encounter (Signed)
emmi mailed  °

## 2014-09-23 ENCOUNTER — Ambulatory Visit: Payer: 59 | Admitting: Internal Medicine

## 2014-10-01 ENCOUNTER — Ambulatory Visit (INDEPENDENT_AMBULATORY_CARE_PROVIDER_SITE_OTHER): Payer: 59 | Admitting: Family

## 2014-10-01 ENCOUNTER — Encounter: Payer: Self-pay | Admitting: Family

## 2014-10-01 ENCOUNTER — Other Ambulatory Visit (INDEPENDENT_AMBULATORY_CARE_PROVIDER_SITE_OTHER): Payer: 59

## 2014-10-01 VITALS — BP 162/98 | HR 72 | Temp 98.1°F | Resp 18 | Ht 73.0 in | Wt 262.6 lb

## 2014-10-01 DIAGNOSIS — IMO0002 Reserved for concepts with insufficient information to code with codable children: Secondary | ICD-10-CM

## 2014-10-01 DIAGNOSIS — E785 Hyperlipidemia, unspecified: Secondary | ICD-10-CM

## 2014-10-01 DIAGNOSIS — E1165 Type 2 diabetes mellitus with hyperglycemia: Secondary | ICD-10-CM

## 2014-10-01 DIAGNOSIS — I10 Essential (primary) hypertension: Secondary | ICD-10-CM

## 2014-10-01 LAB — LDL CHOLESTEROL, DIRECT: LDL DIRECT: 90 mg/dL

## 2014-10-01 LAB — LIPID PANEL
CHOL/HDL RATIO: 7
Cholesterol: 190 mg/dL (ref 0–200)
HDL: 27.1 mg/dL — AB (ref >39.00)
NonHDL: 162.9
Triglycerides: 410 mg/dL — ABNORMAL HIGH (ref 0.0–149.0)
VLDL: 82 mg/dL — AB (ref 0.0–40.0)

## 2014-10-01 LAB — TESTOSTERONE: TESTOSTERONE: 204.49 ng/dL — AB (ref 300.00–890.00)

## 2014-10-01 LAB — GLUCOSE, POCT (MANUAL RESULT ENTRY): POC Glucose: 127 mg/dl — AB (ref 70–99)

## 2014-10-01 MED ORDER — AMLODIPINE BESYLATE 5 MG PO TABS: 5.0000 mg | ORAL_TABLET | Freq: Every day | ORAL | Status: DC

## 2014-10-01 NOTE — Assessment & Plan Note (Signed)
Blood pressure remains above goal. Start amlodipine daily. Continue metoprolol and the lisinopril. Continue to monitor blood pressures at home.

## 2014-10-01 NOTE — Progress Notes (Signed)
   Subjective:    Patient ID: [name], male    DOB: 1961/09/18, 53 y.o.   MRN: [id number]  No chief complaint on file.   HPI:  Kevin Miller is a 53 y.o. male who presents today for follow up of his diabetes and hypertension.  1) Diabetes - Was previously seen for Diabetes about 2 weeks ago and started on Glipizide. Some blurry when sugar drops quickly, but does not report any hypoglycemic events. Blood sugars at home have ranged from 94-140. At night after meals around 180.   Lab Results  Component Value Date   HGBA1C 11.9* 09/16/2014   2) Hypertension - Currently taking lisinopril and the metoprolol. Metoprolol was previously increased to 100 mg daily.  No adverse side chest pain/discomfort, shortness of breath, palpitations, or edema.    BP Readings from Last 3 Encounters:  09/16/14 142/100  11/17/12 130/94  09/18/11 140/82   Allergies  Allergen Reactions  . Gemfibrozil     REACTION: abn LFTs   Current Outpatient Prescriptions on File Prior to Visit  Medication Sig Dispense Refill  . acetaminophen (TYLENOL) 500 MG tablet Take 1,000 mg by mouth every 6 (six) hours as needed.        Marland Kitchen buPROPion (WELLBUTRIN SR) 150 MG 12 hr tablet TAKE 1 TABLET (150 MG TOTAL) BY MOUTH EVERY MORNING.  30 tablet  0  . glimepiride (AMARYL) 2 MG tablet Take 1 tablet (2 mg total) by mouth daily before breakfast.  30 tablet  3  . glucose blood (ONE TOUCH ULTRA TEST) test strip USE ONCE DAILY AS NEEDED  50 each  3  . lisinopril (PRINIVIL,ZESTRIL) 10 MG tablet TAKE 1 TABLET (10 MG TOTAL) BY MOUTH DAILY.  90 tablet  3  . metFORMIN (GLUCOPHAGE) 500 MG tablet TAKE 1 TABLET (500 MG TOTAL) BY MOUTH 3 (THREE) TIMES DAILY.  90 tablet  0  . metoprolol (LOPRESSOR) 50 MG tablet Take 1 tablet (50 mg total) by mouth 2 (two) times daily.  60 tablet  0  . Cholecalciferol 1000 UNITS tablet Take 1,000 Units by mouth daily.        . cyanocobalamin 500 MCG tablet Take 500 mcg by mouth daily.        Marland Kitchen LIPOFEN  150 MG CAPS TAKE 1 CAPSULE BY MOUTH DAILY  90 capsule  0  . Testosterone (ANDROGEL PUMP) 20.25 MG/ACT (1.62%) GEL Place 2 Squirts onto the skin every morning.  225 g  1   No current facility-administered medications on file prior to visit.   Review of Systems    See HPI Objective:    BP 162/98  Pulse 72  Temp(Src) 98.1 F (36.7 C) (Oral)  Resp 18  Ht 6\' 1"  (1.854 m)  Wt 262 lb 9.6 oz (119.115 kg)  BMI 34.65 kg/m2  SpO2 96%  Nursing note and vital signs reviewed.  Physical Exam  Constitutional: He is oriented to person, place, and time. He appears well-developed and well-nourished.  Cardiovascular: Normal rate, regular rhythm and normal heart sounds.   Pulmonary/Chest: Effort normal and breath sounds normal.  Neurological: He is alert and oriented to person, place, and time.  Skin: Skin is warm and dry.  Psychiatric: He has a normal mood and affect. His behavior is normal. Judgment and thought content normal.       Assessment & Plan:

## 2014-10-01 NOTE — Assessment & Plan Note (Addendum)
Reports home blood sugar ranges of 94-150. POCT office reading was 127. Mild blurred vision when his blood sugar drops, but no other adverse effects noted. Emphasized importance of eating with the medication and discussed increasing vegetables and fiber to help maintain sugar. Continue metformin and Amaryl at current levels. Follow up in 2 months for A1c.

## 2014-10-01 NOTE — Progress Notes (Signed)
Pre visit review using our clinic review tool, if applicable. No additional management support is needed unless otherwise documented below in the visit note. 

## 2014-10-01 NOTE — Patient Instructions (Signed)
Thank you for choosing [facility].  Summary/Instructions:   You are doing a great job working on your blood sugar and blood pressure. Please continue to monitor both of these for your next visit in 2 months.  Please stop by the lab prior to leaving for blood work.  Please follow up in about 2 months to check your blood sugar and high blood pressure.  Please let us know if you have any concerns or questions.

## 2014-10-06 ENCOUNTER — Encounter: Payer: Self-pay | Admitting: Family

## 2014-10-08 ENCOUNTER — Encounter: Payer: Self-pay | Admitting: *Deleted

## 2014-10-08 ENCOUNTER — Telehealth: Payer: Self-pay | Admitting: Internal Medicine

## 2014-10-08 NOTE — Telephone Encounter (Signed)
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 Please advise, and call pt back ASAP, it is over due.

## 2014-10-08 NOTE — Telephone Encounter (Signed)
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  He is looking for this paper an needs to talk to nurse about it   Best number [phone]

## 2014-10-08 NOTE — Telephone Encounter (Signed)
Paper work sent do the company. Called pt and left message letting him know.

## 2014-10-30 ENCOUNTER — Other Ambulatory Visit: Payer: Self-pay | Admitting: Internal Medicine

## 2014-11-10 ENCOUNTER — Other Ambulatory Visit: Payer: Self-pay | Admitting: Internal Medicine

## 2014-11-26 ENCOUNTER — Other Ambulatory Visit (INDEPENDENT_AMBULATORY_CARE_PROVIDER_SITE_OTHER): Payer: 59

## 2014-11-26 ENCOUNTER — Ambulatory Visit (INDEPENDENT_AMBULATORY_CARE_PROVIDER_SITE_OTHER): Payer: 59 | Admitting: Family

## 2014-11-26 ENCOUNTER — Encounter: Payer: Self-pay | Admitting: Family

## 2014-11-26 VITALS — BP 144/86 | HR 63 | Temp 98.0°F | Resp 18 | Ht 73.0 in | Wt 259.8 lb

## 2014-11-26 DIAGNOSIS — E1165 Type 2 diabetes mellitus with hyperglycemia: Secondary | ICD-10-CM

## 2014-11-26 DIAGNOSIS — I10 Essential (primary) hypertension: Secondary | ICD-10-CM

## 2014-11-26 DIAGNOSIS — IMO0002 Reserved for concepts with insufficient information to code with codable children: Secondary | ICD-10-CM

## 2014-11-26 LAB — LIPID PANEL
CHOL/HDL RATIO: 8
Cholesterol: 196 mg/dL (ref 0–200)
HDL: 24.3 mg/dL — AB (ref >39.00)
NONHDL: 171.7
TRIGLYCERIDES: 220 mg/dL — AB (ref 0.0–149.0)
VLDL: 44 mg/dL — ABNORMAL HIGH (ref 0.0–40.0)

## 2014-11-26 LAB — BASIC METABOLIC PANEL
BUN: 17 mg/dL (ref 6–23)
CHLORIDE: 103 meq/L (ref 96–112)
CO2: 27 meq/L (ref 19–32)
CREATININE: 1.1 mg/dL (ref 0.4–1.5)
Calcium: 9 mg/dL (ref 8.4–10.5)
GFR: 77.93 mL/min (ref >60.00)
Glucose, Bld: 107 mg/dL — ABNORMAL HIGH (ref 70–99)
Potassium: 4.5 mEq/L (ref 3.5–5.1)
SODIUM: 138 meq/L (ref 135–145)

## 2014-11-26 LAB — HEMOGLOBIN A1C: HEMOGLOBIN A1C: 7.3 % — AB (ref 4.6–6.5)

## 2014-11-26 LAB — LDL CHOLESTEROL, DIRECT: Direct LDL: 127.8 mg/dL

## 2014-11-26 MED ORDER — METFORMIN HCL 500 MG PO TABS
ORAL_TABLET | ORAL | Status: DC
Start: 2014-11-26 — End: 2015-11-28

## 2014-11-26 MED ORDER — LISINOPRIL 20 MG PO TABS: 20.0000 mg | ORAL_TABLET | Freq: Every day | ORAL | Status: DC

## 2014-11-26 MED ORDER — GLIMEPIRIDE 2 MG PO TABS: 2.0000 mg | ORAL_TABLET | Freq: Every day | ORAL | Status: DC

## 2014-11-26 MED ORDER — METOPROLOL TARTRATE 50 MG PO TABS: 50.0000 mg | ORAL_TABLET | Freq: Two times a day (BID) | ORAL | Status: DC

## 2014-11-26 NOTE — Assessment & Plan Note (Addendum)
Based on home readings diabetes appears to be under improved control. Obtain A1c and basic metabolic panel. Continue metformin and glimepiride at current levels. Follow-up in 3 months.

## 2014-11-26 NOTE — Patient Instructions (Signed)
Thank you for choosing [facility].  Summary/Instructions:  Your prescription(s) have been submitted to your pharmacy. Please take as directed and contact our office if you believe you are having problem(s) with the medication(s).  Please stop by the lab on the basement level of the building for your blood work. Your results will be released to [facility] (or called to you) after review, usually within 72hours after test completion. If any changes need to be made, you will be notified at that same time.

## 2014-11-26 NOTE — Progress Notes (Signed)
Pre visit review using our clinic review tool, if applicable. No additional management support is needed unless otherwise documented below in the visit note. 

## 2014-11-26 NOTE — Progress Notes (Signed)
   Subjective:    Patient ID: Kevin Miller, male    DOB: 07-Jun-1961, 53 y.o.   MRN: [id number]  Chief Complaint  Patient presents with  . Follow-up    Does try to keep up with sugar on a regular basis, sugars in the morning usually run anywhere from the 120 to 160s. During the day gets in the 90s    HPI:  Kevin Miller is a 53 y.o. male who presents today for follow up of diabetes.  1) Diabetes - Currently maintained on metformin and glimepiride. Indicates blood sugars have been ranging from the 120's to 160's. States that he has been feeling better and has lost some weight. Denies any changes in vision or hypoglycemic events.  Does report having some tingling in his left foot, but that is the foot that he fell and broke his left foot.    Lab Results  Component Value Date   HGBA1C 11.9* 09/16/2014    Wt Readings from Last 3 Encounters:  11/26/14 259 lb 12.8 oz (117.845 kg)  10/01/14 262 lb 9.6 oz (119.115 kg)  09/16/14 263 lb 12.8 oz (119.659 kg)   2) Hypertension - currently maintained on amlodipine, lisinopril and metoprolol. Indicates his pressures have been ranging in the 130s to 140s. Denies any changes in vision, headaches, chest pain/discomfort, shortness of breath, heart palpitations or edema.  BP Readings from Last 3 Encounters:  11/26/14 144/86  10/01/14 162/98  09/16/14 142/100   Allergies  Allergen Reactions  . Gemfibrozil     REACTION: abn LFTs   Current Outpatient Prescriptions on File Prior to Visit  Medication Sig Dispense Refill  . acetaminophen (TYLENOL) 500 MG tablet Take 1,000 mg by mouth every 6 (six) hours as needed.      Marland Kitchen amLODipine (NORVASC) 5 MG tablet Take 1 tablet (5 mg total) by mouth daily. 90 tablet 3  . buPROPion (WELLBUTRIN SR) 150 MG 12 hr tablet TAKE 1 TABLET (150 MG TOTAL) BY MOUTH EVERY MORNING. 30 tablet 5  . Cholecalciferol 1000 UNITS tablet Take 1,000 Units by mouth daily.      . cyanocobalamin 500 MCG tablet Take 500 mcg by  mouth daily.      Marland Kitchen glucose blood (ONE TOUCH ULTRA TEST) test strip USE ONCE DAILY AS NEEDED 50 each 3  . LIPOFEN 150 MG CAPS TAKE 1 CAPSULE BY MOUTH DAILY 90 capsule 0  . Testosterone (ANDROGEL PUMP) 20.25 MG/ACT (1.62%) GEL Place 2 Squirts onto the skin every morning. 225 g 1   No current facility-administered medications on file prior to visit.     Review of Systems    See HPI  Objective:    BP 144/86 mmHg  Pulse 63  Temp(Src) 98 F (36.7 C) (Oral)  Resp 18  Ht 6\' 1"  (1.854 m)  Wt 259 lb 12.8 oz (117.845 kg)  BMI 34.28 kg/m2  SpO2 99% Nursing note and vital signs reviewed.  Physical Exam  Constitutional: He is oriented to person, place, and time. He appears well-developed and well-nourished. No distress.  Cardiovascular: Normal rate, regular rhythm, normal heart sounds and intact distal pulses.   Pulmonary/Chest: Effort normal and breath sounds normal.  Neurological: He is alert and oriented to person, place, and time.  Skin: Skin is warm and dry.  Psychiatric: He has a normal mood and affect. His behavior is normal. Judgment and thought content normal.       Assessment & Plan:

## 2014-11-26 NOTE — Assessment & Plan Note (Addendum)
Blood pressures remain slightly elevated above goal of 130/90. Increase lisinopril to 20 mg daily. Continue metoprolol and amlodipine at current dosage.

## 2014-11-28 ENCOUNTER — Encounter: Payer: Self-pay | Admitting: Family

## 2014-11-28 ENCOUNTER — Telehealth: Payer: Self-pay | Admitting: Family

## 2014-12-20 ENCOUNTER — Other Ambulatory Visit: Payer: Self-pay

## 2014-12-20 MED ORDER — BUPROPION HCL ER (SR) 150 MG PO TB12: ORAL_TABLET | ORAL | Status: DC

## 2014-12-21 NOTE — Telephone Encounter (Signed)
Encounter closed

## 2015-02-25 ENCOUNTER — Ambulatory Visit (INDEPENDENT_AMBULATORY_CARE_PROVIDER_SITE_OTHER): Payer: 59 | Admitting: Family

## 2015-02-25 ENCOUNTER — Encounter: Payer: Self-pay | Admitting: Family

## 2015-02-25 ENCOUNTER — Other Ambulatory Visit (INDEPENDENT_AMBULATORY_CARE_PROVIDER_SITE_OTHER): Payer: 59

## 2015-02-25 VITALS — BP 140/90 | HR 70 | Temp 98.1°F | Resp 18 | Wt 267.4 lb

## 2015-02-25 DIAGNOSIS — IMO0002 Reserved for concepts with insufficient information to code with codable children: Secondary | ICD-10-CM

## 2015-02-25 DIAGNOSIS — E1165 Type 2 diabetes mellitus with hyperglycemia: Secondary | ICD-10-CM

## 2015-02-25 DIAGNOSIS — I10 Essential (primary) hypertension: Secondary | ICD-10-CM

## 2015-02-25 LAB — LIPID PANEL
CHOLESTEROL: 194 mg/dL (ref 0–200)
HDL: 33.1 mg/dL — AB (ref >39.00)
NonHDL: 160.9
Total CHOL/HDL Ratio: 6
Triglycerides: 212 mg/dL — ABNORMAL HIGH (ref 0.0–149.0)
VLDL: 42.4 mg/dL — AB (ref 0.0–40.0)

## 2015-02-25 LAB — LDL CHOLESTEROL, DIRECT: Direct LDL: 122 mg/dL

## 2015-02-25 LAB — HEMOGLOBIN A1C: HEMOGLOBIN A1C: 6.6 % — AB (ref 4.6–6.5)

## 2015-02-25 NOTE — Progress Notes (Signed)
Subjective:    Patient ID: Kevin Miller, male    DOB: 1961-01-17, 55 y.o.   MRN: [id number]   Chief Complaint  Patient presents with  . Follow-up    BP has been better, sugar goes up and down a little bit but for the most part has been ok    HPI:  Kevin Miller is a 54 y.o. male who presents today for follow up of his diabetes and hypertension.  1) Diabetes - Previously improved control with metformin and glimepiride. Notes that he continues to take his medication as prescribed. Notes that when he takes the metformin during the middle of the day.  Lab Results  Component Value Date   HGBA1C 7.3* 11/26/2014     2) Hypertension - Indicates that his blood pressures have been fairly stable at home. May have some cough all the time so difficult to determine if related to lisinopril.    BP Readings from Last 3 Encounters:  02/25/15 140/90  11/26/14 144/86  10/01/14 162/98    Allergies  Allergen Reactions  . Gemfibrozil     REACTION: abn LFTs    Current Outpatient Prescriptions on File Prior to Visit  Medication Sig Dispense Refill  . acetaminophen (TYLENOL) 500 MG tablet Take 1,000 mg by mouth every 6 (six) hours as needed.      Marland Kitchen amLODipine (NORVASC) 5 MG tablet Take 1 tablet (5 mg total) by mouth daily. 90 tablet 3  . buPROPion (WELLBUTRIN SR) 150 MG 12 hr tablet TAKE 1 TABLET (150 MG TOTAL) BY MOUTH EVERY MORNING. 30 tablet 5  . Cholecalciferol 1000 UNITS tablet Take 1,000 Units by mouth daily.      . cyanocobalamin 500 MCG tablet Take 500 mcg by mouth daily.      Marland Kitchen glimepiride (AMARYL) 2 MG tablet Take 1 tablet (2 mg total) by mouth daily before breakfast. 90 tablet 3  . glucose blood (ONE TOUCH ULTRA TEST) test strip USE ONCE DAILY AS NEEDED 50 each 3  . LIPOFEN 150 MG CAPS TAKE 1 CAPSULE BY MOUTH DAILY 90 capsule 0  . lisinopril (PRINIVIL,ZESTRIL) 20 MG tablet Take 1 tablet (20 mg total) by mouth daily. 90 tablet 3  . metFORMIN (GLUCOPHAGE) 500 MG tablet TAKE  1 TABLET (500 MG TOTAL) BY MOUTH 3 (THREE) TIMES DAILY. 270 tablet 3  . metoprolol (LOPRESSOR) 50 MG tablet Take 1 tablet (50 mg total) by mouth 2 (two) times daily. 180 tablet 3   No current facility-administered medications on file prior to visit.     Review of Systems  Eyes:       Negative for changes in vision.   Respiratory: Negative for chest tightness and shortness of breath.   Cardiovascular: Negative for chest pain, palpitations and leg swelling.  Endocrine: Negative for polydipsia, polyphagia and polyuria.  Neurological: Negative for numbness.      Objective:    BP 140/90 mmHg  Pulse 70  Temp(Src) 98.1 F (36.7 C) (Oral)  Resp 18  Wt 267 lb 6.4 oz (121.292 kg)  SpO2 98% Nursing note and vital signs reviewed.  Physical Exam  Constitutional: He is oriented to person, place, and time. He appears well-developed and well-nourished. No distress.  Cardiovascular: Normal rate, regular rhythm, normal heart sounds and intact distal pulses.   Pulmonary/Chest: Effort normal and breath sounds normal.  Neurological: He is alert and oriented to person, place, and time.  Skin: Skin is warm and dry.  Psychiatric: He has a normal mood  and affect. His behavior is normal. Judgment and thought content normal.       Assessment & Plan:

## 2015-02-25 NOTE — Patient Instructions (Addendum)
Thank you for choosing [facility].  Summary/Instructions:  If your symptoms worsen or fail to improve, please contact our office for further instruction, or in case of emergency go directly to the emergency room at the closest medical facility.     

## 2015-02-25 NOTE — Assessment & Plan Note (Signed)
Previous A1c shows improved control. Pt indicates some low blood sugar after lunch following afternoon dose of metformin. Obtain A1c and lipid profile. Consider decreasing afternoon dose of metformin to 250 mg pending A1c. Pt will follow up with eye exam. Follow up in 6 months.

## 2015-02-25 NOTE — Progress Notes (Signed)
Pre visit review using our clinic review tool, if applicable. No additional management support is needed unless otherwise documented below in the visit note. 

## 2015-02-25 NOTE — Assessment & Plan Note (Signed)
Blood pressure remains stable and around goal of 140/90. Continue current dosage of amlodipine and lisinopril. Follow up in 6 months.

## 2015-04-22 ENCOUNTER — Other Ambulatory Visit: Payer: Self-pay | Admitting: Family

## 2015-08-26 ENCOUNTER — Other Ambulatory Visit (INDEPENDENT_AMBULATORY_CARE_PROVIDER_SITE_OTHER): Payer: 59

## 2015-08-26 ENCOUNTER — Ambulatory Visit (INDEPENDENT_AMBULATORY_CARE_PROVIDER_SITE_OTHER): Payer: 59 | Admitting: Family

## 2015-08-26 ENCOUNTER — Encounter: Payer: Self-pay | Admitting: Family

## 2015-08-26 VITALS — BP 130/86 | HR 67 | Temp 98.5°F | Resp 18 | Ht 73.0 in | Wt 276.0 lb

## 2015-08-26 DIAGNOSIS — Z23 Encounter for immunization: Secondary | ICD-10-CM

## 2015-08-26 DIAGNOSIS — E1165 Type 2 diabetes mellitus with hyperglycemia: Secondary | ICD-10-CM

## 2015-08-26 DIAGNOSIS — E785 Hyperlipidemia, unspecified: Secondary | ICD-10-CM

## 2015-08-26 DIAGNOSIS — IMO0002 Reserved for concepts with insufficient information to code with codable children: Secondary | ICD-10-CM

## 2015-08-26 LAB — LIPID PANEL
CHOL/HDL RATIO: 6
CHOLESTEROL: 168 mg/dL (ref 0–200)
HDL: 29.7 mg/dL — ABNORMAL LOW (ref >39.00)
Triglycerides: 472 mg/dL — ABNORMAL HIGH (ref 0.0–149.0)

## 2015-08-26 LAB — HEMOGLOBIN A1C: Hgb A1c MFr Bld: 7.3 % — ABNORMAL HIGH (ref 4.6–6.5)

## 2015-08-26 LAB — LDL CHOLESTEROL, DIRECT: Direct LDL: 59 mg/dL

## 2015-08-26 MED ORDER — GLIPIZIDE 5 MG PO TABS: 5.0000 mg | ORAL_TABLET | Freq: Every day | ORAL | Status: DC

## 2015-08-26 MED ORDER — NAPROXEN 500 MG PO TABS: 500.0000 mg | ORAL_TABLET | Freq: Two times a day (BID) | ORAL | Status: DC | PRN

## 2015-08-26 NOTE — Assessment & Plan Note (Signed)
Previously noted to have elevated triglycerides and LDL that was improving with diet. Since he previous visit he reports that his diet has worsened slighlty. Obtain lipid profile. Discussed need for starting medication if number continue to be elevated and increased risk for cardiovascular disease.

## 2015-08-26 NOTE — Progress Notes (Signed)
Subjective:    Patient ID: Kevin Miller, male    DOB: January 27, 1961, 54 y.o.   MRN: [id number]  Chief Complaint  Patient presents with  . Follow-up    diabetes has been pretty ok, it varies sometimes he can wake up in the morning and it be 180    HPI:  Kevin Miller is a 54 y.o. male with a PMH of hypertension, fatty liver, Type 2 diabetes, B12 deficiency, depression and anxiety who presents today for a follow up office visit.   1.) Diabetes - Currently maintained on glimperiride. Takes the medication as prescirbed and denies adverse side effects or hypoglycemic events. Reports that his blood sugars have been labile indicating that he is around 140-180.  Lab Results  Component Value Date   HGBA1C 6.6* 02/25/2015    2.)  Cholesterol - Not currently maintained on medication. Notes that his diet has worsened since previous visit. Would like to recheck his cholesterol and triglycerides.    Allergies  Allergen Reactions  . Gemfibrozil     REACTION: abn LFTs    Current Outpatient Prescriptions on File Prior to Visit  Medication Sig Dispense Refill  . acetaminophen (TYLENOL) 500 MG tablet Take 1,000 mg by mouth every 6 (six) hours as needed.      Marland Kitchen amLODipine (NORVASC) 5 MG tablet Take 1 tablet (5 mg total) by mouth daily. 90 tablet 3  . buPROPion (WELLBUTRIN SR) 150 MG 12 hr tablet TAKE 1 TABLET (150 MG TOTAL) BY MOUTH EVERY MORNING. 30 tablet 5  . Cholecalciferol 1000 UNITS tablet Take 1,000 Units by mouth daily.      . cyanocobalamin 500 MCG tablet Take 500 mcg by mouth daily.      Marland Kitchen glucose blood (ONE TOUCH ULTRA TEST) test strip USE ONCE DAILY AS NEEDED 50 each 3  . lisinopril (PRINIVIL,ZESTRIL) 20 MG tablet Take 1 tablet (20 mg total) by mouth daily. 90 tablet 3  . metFORMIN (GLUCOPHAGE) 500 MG tablet TAKE 1 TABLET (500 MG TOTAL) BY MOUTH 3 (THREE) TIMES DAILY. 270 tablet 3  . metoprolol (LOPRESSOR) 50 MG tablet Take 1 tablet (50 mg total) by mouth 2 (two) times daily.  180 tablet 3   No current facility-administered medications on file prior to visit.     Review of Systems  Constitutional: Negative for fever and chills.  Respiratory: Negative for chest tightness and shortness of breath.   Cardiovascular: Negative for chest pain, palpitations and leg swelling.  Endocrine: Negative for polydipsia, polyphagia and polyuria.  Neurological: Negative for headaches.      Objective:    BP 130/86 mmHg  Pulse 67  Temp(Src) 98.5 F (36.9 C) (Oral)  Resp 18  Ht 6\' 1"  (1.854 m)  Wt 276 lb (125.193 kg)  BMI 36.42 kg/m2  SpO2 98% Nursing note and vital signs reviewed.  Physical Exam  Constitutional: He is oriented to person, place, and time. He appears well-developed and well-nourished. No distress.  Cardiovascular: Normal rate, regular rhythm, normal heart sounds and intact distal pulses.   Pulmonary/Chest: Effort normal and breath sounds normal.  Neurological: He is alert and oriented to person, place, and time.  Skin: Skin is warm and dry.  Psychiatric: He has a normal mood and affect. His behavior is normal. Judgment and thought content normal.       Assessment & Plan:   Problem List Items Addressed This Visit      Other   Type II diabetes mellitus, uncontrolled - Primary  Noted with improved control during previous visit and reports his average morning blood sugars are 130-180 with current regimen. Denies adverse or hypoglycemic effects. Discontinue glimepiride and start glipizide. Will research cost of Tradjenta. Continue current dose of metformin. Obtain A1c. Changes pending A1c results.       Relevant Medications   glipiZIDE (GLUCOTROL) 5 MG tablet   Other Relevant Orders   Hemoglobin A1c   Hyperlipidemia    Previously noted to have elevated triglycerides and LDL that was improving with diet. Since he previous visit he reports that his diet has worsened slighlty. Obtain lipid profile. Discussed need for starting medication if number  continue to be elevated and increased risk for cardiovascular disease.       Relevant Orders   Lipid Profile    Other Visit Diagnoses    Encounter for immunization        Need for TD vaccine        Relevant Orders    Td vaccine greater than or equal to 7yo preservative free IM (Completed)

## 2015-08-26 NOTE — Assessment & Plan Note (Signed)
Noted with improved control during previous visit and reports his average morning blood sugars are 130-180 with current regimen. Denies adverse or hypoglycemic effects. Discontinue glimepiride and start glipizide. Will research cost of Tradjenta. Continue current dose of metformin. Obtain A1c. Changes pending A1c results.

## 2015-08-26 NOTE — Patient Instructions (Signed)
Thank you for choosing [facility].  Summary/Instructions:  Your prescription(s) have been submitted to your pharmacy or been printed and provided for you. Please take as directed and contact our office if you believe you are having problem(s) with the medication(s) or have any questions.  Please stop by the lab on the basement level of the building for your blood work. Your results will be released to [place] (or called to you) after review, usually within 72 hours after test completion. If any changes need to be made, you will be notified at that same time.  If your symptoms worsen or fail to improve, please contact our office for further instruction, or in case of emergency go directly to the emergency room at the closest medical facility.    Fat and Cholesterol Control Diet Fat and cholesterol levels in your blood and organs are influenced by your diet. High levels of fat and cholesterol may lead to diseases of the heart, small and large blood vessels, gallbladder, liver, and pancreas. CONTROLLING FAT AND CHOLESTEROL WITH DIET Although exercise and lifestyle factors are important, your diet is key. That is because certain foods are known to raise cholesterol and others to lower it. The goal is to balance foods for their effect on cholesterol and more importantly, to replace saturated and trans fat with other types of fat, such as monounsaturated fat, polyunsaturated fat, and omega-3 fatty acids. On average, a person should consume no more than 15 to 17 g of saturated fat daily. Saturated and trans fats are considered "bad" fats, and they will raise LDL cholesterol. Saturated fats are primarily found in animal products such as meats, butter, and cream. However, that does not mean you need to give up all your favorite foods. Today, there are good tasting, low-fat, low-cholesterol substitutes for most of the things you like to eat. Choose low-fat or nonfat alternatives. Choose round or loin  cuts of red meat. These types of cuts are lowest in fat and cholesterol. Chicken (without the skin), fish, veal, and ground [place] breast are great choices. Eliminate fatty meats, such as hot dogs and salami. Even shellfish have little or no saturated fat. Have a 3 oz (85 g) portion when you eat lean meat, poultry, or fish. Trans fats are also called "partially hydrogenated oils." They are oils that have been scientifically manipulated so that they are solid at room temperature resulting in a longer shelf life and improved taste and texture of foods in which they are added. Trans fats are found in stick margarine, some tub margarines, cookies, crackers, and baked goods.  When baking and cooking, oils are a great substitute for butter. The monounsaturated oils are especially beneficial since it is believed they lower LDL and raise HDL. The oils you should avoid entirely are saturated tropical oils, such as coconut and palm.  Remember to eat a lot from food groups that are naturally free of saturated and trans fat, including fish, fruit, vegetables, beans, grains (barley, rice, couscous, bulgur wheat), and pasta (without cream sauces).  IDENTIFYING FOODS THAT LOWER FAT AND CHOLESTEROL  Soluble fiber may lower your cholesterol. This type of fiber is found in fruits such as apples, vegetables such as broccoli, potatoes, and carrots, legumes such as beans, peas, and lentils, and grains such as barley. Foods fortified with plant sterols (phytosterol) may also lower cholesterol. You should eat at least 2 g per day of these foods for a cholesterol lowering effect.  Read package labels to identify low-saturated fats,  trans fat free, and low-fat foods at the supermarket. Select cheeses that have only 2 to 3 g saturated fat per ounce. Use a heart-healthy tub margarine that is free of trans fats or partially hydrogenated oil. When buying baked goods (cookies, crackers), avoid partially hydrogenated oils. Breads and  muffins should be made from whole grains (whole-wheat or whole oat flour, instead of "flour" or "enriched flour"). Buy non-creamy canned soups with reduced salt and no added fats.  FOOD PREPARATION TECHNIQUES  Never deep-fry. If you must fry, either stir-fry, which uses very little fat, or use non-stick cooking sprays. When possible, broil, bake, or roast meats, and steam vegetables. Instead of putting butter or margarine on vegetables, use lemon and herbs, applesauce, and cinnamon (for squash and sweet potatoes). Use nonfat yogurt, salsa, and low-fat dressings for salads.  LOW-SATURATED FAT / LOW-FAT FOOD SUBSTITUTES Meats / Saturated Fat (g)  Avoid: Steak, marbled (3 oz/85 g) / 11 g  Choose: Steak, lean (3 oz/85 g) / 4 g  Avoid: Hamburger (3 oz/85 g) / 7 g  Choose: Hamburger, lean (3 oz/85 g) / 5 g  Avoid: Ham (3 oz/85 g) / 6 g  Choose: Ham, lean cut (3 oz/85 g) / 2.4 g  Avoid: Chicken, with skin, dark meat (3 oz/85 g) / 4 g  Choose: Chicken, skin removed, dark meat (3 oz/85 g) / 2 g  Avoid: Chicken, with skin, light meat (3 oz/85 g) / 2.5 g  Choose: Chicken, skin removed, light meat (3 oz/85 g) / 1 g Dairy / Saturated Fat (g)  Avoid: Whole milk (1 cup) / 5 g  Choose: Low-fat milk, 2% (1 cup) / 3 g  Choose: Low-fat milk, 1% (1 cup) / 1.5 g  Choose: Skim milk (1 cup) / 0.3 g  Avoid: Hard cheese (1 oz/28 g) / 6 g  Choose: Skim milk cheese (1 oz/28 g) / 2 to 3 g  Avoid: Cottage cheese, 4% fat (1 cup) / 6.5 g  Choose: Low-fat cottage cheese, 1% fat (1 cup) / 1.5 g  Avoid: Ice cream (1 cup) / 9 g  Choose: Sherbet (1 cup) / 2.5 g  Choose: Nonfat frozen yogurt (1 cup) / 0.3 g  Choose: Frozen fruit bar / trace  Avoid: Whipped cream (1 tbs) / 3.5 g  Choose: Nondairy whipped topping (1 tbs) / 1 g Condiments / Saturated Fat (g)  Avoid: Mayonnaise (1 tbs) / 2 g  Choose: Low-fat mayonnaise (1 tbs) / 1 g  Avoid: Butter (1 tbs) / 7 g  Choose: Extra light margarine (1  tbs) / 1 g  Avoid: Coconut oil (1 tbs) / 11.8 g  Choose: Olive oil (1 tbs) / 1.8 g  Choose: Corn oil (1 tbs) / 1.7 g  Choose: Safflower oil (1 tbs) / 1.2 g  Choose: Sunflower oil (1 tbs) / 1.4 g  Choose: Soybean oil (1 tbs) / 2.4 g  Choose: Canola oil (1 tbs) / 1 g Document Released: 11/26/2005 Document Revised: 03/23/2013 Document Reviewed: 02/24/2014 ExitCare Patient Information 2015 [place], [place]. This information is not intended to replace advice given to you by your health care provider. Make sure you discuss any questions you have with your health care provider.

## 2015-08-28 ENCOUNTER — Other Ambulatory Visit: Payer: Self-pay | Admitting: Family

## 2015-08-28 ENCOUNTER — Encounter: Payer: Self-pay | Admitting: Family

## 2015-09-01 ENCOUNTER — Other Ambulatory Visit: Payer: Self-pay | Admitting: Family

## 2015-09-12 ENCOUNTER — Telehealth: Payer: Self-pay | Admitting: Family

## 2015-09-12 NOTE — Telephone Encounter (Signed)
Your lab results are back and show that your A1c has increased slightly to 7.3 from 6.8. Therefore I would continue with the current medications until we find out about the [place]. Also, your triglycerides are high as we anticipated. I am going to recommend as we discussed the cholesterol medication to reduce your risk for coronary artery disease and also to reduce your triglycerides. Keep in mind this is not permanent, but a means to work on reducing your risk. Please let me know if you are open to it and I will send in the medications

## 2015-09-14 NOTE — Telephone Encounter (Signed)
LVM for pt to call back.

## 2015-09-20 ENCOUNTER — Encounter: Payer: Self-pay | Admitting: Family

## 2015-09-20 NOTE — Telephone Encounter (Signed)
LVM for pt to call back. Sending results in the mail. 

## 2015-09-22 MED ORDER — LINAGLIPTIN 5 MG PO TABS: 5.0000 mg | ORAL_TABLET | Freq: Every day | ORAL | Status: DC

## 2015-09-22 MED ORDER — PRAVASTATIN SODIUM 20 MG PO TABS: 20.0000 mg | ORAL_TABLET | Freq: Every day | ORAL | Status: DC

## 2015-10-17 ENCOUNTER — Other Ambulatory Visit: Payer: Self-pay | Admitting: Family

## 2015-11-25 ENCOUNTER — Ambulatory Visit: Payer: 59 | Admitting: Family

## 2015-11-28 ENCOUNTER — Other Ambulatory Visit: Payer: Self-pay | Admitting: Family

## 2015-11-29 ENCOUNTER — Other Ambulatory Visit: Payer: Self-pay | Admitting: Family

## 2015-12-23 ENCOUNTER — Encounter: Payer: Self-pay | Admitting: Family

## 2015-12-23 ENCOUNTER — Other Ambulatory Visit: Payer: Self-pay | Admitting: Family

## 2015-12-23 DIAGNOSIS — E1165 Type 2 diabetes mellitus with hyperglycemia: Principal | ICD-10-CM

## 2015-12-23 DIAGNOSIS — IMO0001 Reserved for inherently not codable concepts without codable children: Secondary | ICD-10-CM

## 2015-12-26 MED ORDER — GLIPIZIDE 5 MG PO TABS: 5.0000 mg | ORAL_TABLET | Freq: Every day | ORAL | Status: DC

## 2016-01-03 ENCOUNTER — Other Ambulatory Visit: Payer: Self-pay | Admitting: Family

## 2016-01-04 ENCOUNTER — Other Ambulatory Visit: Payer: Self-pay | Admitting: Family

## 2016-01-31 ENCOUNTER — Other Ambulatory Visit: Payer: Self-pay | Admitting: Family

## 2016-02-01 NOTE — Telephone Encounter (Signed)
i'm not showing a recent office visit addressing depression---please advise, thanks

## 2016-02-10 ENCOUNTER — Encounter: Payer: Self-pay | Admitting: Family

## 2016-02-10 ENCOUNTER — Other Ambulatory Visit (INDEPENDENT_AMBULATORY_CARE_PROVIDER_SITE_OTHER): Payer: 59

## 2016-02-10 ENCOUNTER — Ambulatory Visit (INDEPENDENT_AMBULATORY_CARE_PROVIDER_SITE_OTHER): Payer: 59 | Admitting: Family

## 2016-02-10 VITALS — BP 122/88 | HR 79 | Temp 98.3°F | Resp 16 | Ht 73.0 in | Wt 280.0 lb

## 2016-02-10 DIAGNOSIS — E785 Hyperlipidemia, unspecified: Secondary | ICD-10-CM

## 2016-02-10 DIAGNOSIS — I10 Essential (primary) hypertension: Secondary | ICD-10-CM | POA: Diagnosis not present

## 2016-02-10 DIAGNOSIS — Z1211 Encounter for screening for malignant neoplasm of colon: Secondary | ICD-10-CM

## 2016-02-10 DIAGNOSIS — E1165 Type 2 diabetes mellitus with hyperglycemia: Secondary | ICD-10-CM | POA: Diagnosis not present

## 2016-02-10 DIAGNOSIS — IMO0001 Reserved for inherently not codable concepts without codable children: Secondary | ICD-10-CM

## 2016-02-10 DIAGNOSIS — F341 Dysthymic disorder: Secondary | ICD-10-CM | POA: Diagnosis not present

## 2016-02-10 DIAGNOSIS — Z23 Encounter for immunization: Secondary | ICD-10-CM

## 2016-02-10 LAB — BASIC METABOLIC PANEL
BUN: 15 mg/dL (ref 6–23)
CHLORIDE: 101 meq/L (ref 96–112)
CO2: 27 mEq/L (ref 19–32)
Calcium: 9.1 mg/dL (ref 8.4–10.5)
Creatinine, Ser: 0.96 mg/dL (ref 0.40–1.50)
GFR: 86.96 mL/min (ref >60.00)
GLUCOSE: 154 mg/dL — AB (ref 70–99)
POTASSIUM: 4.5 meq/L (ref 3.5–5.1)
SODIUM: 137 meq/L (ref 135–145)

## 2016-02-10 LAB — LDL CHOLESTEROL, DIRECT: LDL DIRECT: 47 mg/dL

## 2016-02-10 LAB — LIPID PANEL
CHOL/HDL RATIO: 7
CHOLESTEROL: 187 mg/dL (ref 0–200)
HDL: 28 mg/dL — AB (ref >39.00)
Triglycerides: 871 mg/dL — ABNORMAL HIGH (ref 0.0–149.0)

## 2016-02-10 LAB — HEMOGLOBIN A1C: Hgb A1c MFr Bld: 8.3 % — ABNORMAL HIGH (ref 4.6–6.5)

## 2016-02-10 MED ORDER — GLIPIZIDE 5 MG PO TABS: 5.0000 mg | ORAL_TABLET | Freq: Every day | ORAL | Status: DC

## 2016-02-10 MED ORDER — METOPROLOL TARTRATE 50 MG PO TABS: ORAL_TABLET | ORAL | Status: DC

## 2016-02-10 MED ORDER — AMLODIPINE BESYLATE 5 MG PO TABS: ORAL_TABLET | ORAL | Status: DC

## 2016-02-10 MED ORDER — METFORMIN HCL 500 MG PO TABS: ORAL_TABLET | ORAL | Status: DC

## 2016-02-10 MED ORDER — NAPROXEN 500 MG PO TABS: 500.0000 mg | ORAL_TABLET | Freq: Two times a day (BID) | ORAL | Status: DC | PRN

## 2016-02-10 MED ORDER — LISINOPRIL 20 MG PO TABS: ORAL_TABLET | ORAL | Status: DC

## 2016-02-10 MED ORDER — BUPROPION HCL ER (SR) 150 MG PO TB12: ORAL_TABLET | ORAL | Status: DC

## 2016-02-10 NOTE — Progress Notes (Signed)
Pre visit review using our clinic review tool, if applicable. No additional management support is needed unless otherwise documented below in the visit note. 

## 2016-02-10 NOTE — Patient Instructions (Signed)
Thank you for choosing [facility].  Summary/Instructions:  Continue to take your medication as prescribed.   Your prescription(s) have been submitted to your pharmacy or been printed and provided for you. Please take as directed and contact our office if you believe you are having problem(s) with the medication(s) or have any questions.  Please stop by the lab on the basement level of the building for your blood work. Your results will be released to [place] (or called to you) after review, usually within 72 hours after test completion. If any changes need to be made, you will be notified at that same time.  If your symptoms worsen or fail to improve, please contact our office for further instruction, or in case of emergency go directly to the emergency room at the closest medical facility.

## 2016-02-10 NOTE — Assessment & Plan Note (Signed)
Type 2 diabetes currently maintained on glipizide and metformin with most recent A1c of 7.3. Obtain A1c and basic metabolic profile. Continue current dosage of metformin and glipizide pending A1c results. Pneumovax updated today. Diabetic foot exam completed today. Encouraged to complete diabetic eye exam at his convenience. Maintained on pravastatin and lisinopril for CAD risk reduction. Follow-up pending A1c results.

## 2016-02-10 NOTE — Progress Notes (Signed)
Subjective:    Patient ID: Kevin Miller, male    DOB: 1961-01-24, 55 y.o.   MRN: [id number]  Chief Complaint  Patient presents with  . Follow-up    HPI:  Kevin Miller is a 55 y.o. male who  has a past medical history of Diabetes mellitus; Hypertension; Hyperlipidemia; Pancreatitis (2010); Pancreatic pseudocyst; Clostridium difficile infection (2010); Vitamin B 12 deficiency; Elevated LFTs (2011); Fatty liver; Osteopenia (2011); Neck pain; and Hypogonadism male (2012). and presents today for an office follow up.  1.) Diabetes - Currently maintained on glipizide and metformin. Course taking the medication as prescribed and denies adverse side effects or hypoglycemic events. Blood sugars at home are improved but ranging around 160-200. Denies any new symptoms of end organ damage.   Lab Results  Component Value Date   HGBA1C 7.3* 08/26/2015    2.) Blood pressure - Currently prescribed amlodipine and metoprolol. Reports taking the medication as prescribed and denies adverse side effects or hypotensive readings. Denies symptoms of end organ damage. Does not currently take his blood pressure at home.  BP Readings from Last 3 Encounters:  02/10/16 122/88  08/26/15 130/86  02/25/15 140/90    Allergies  Allergen Reactions  . Gemfibrozil     REACTION: abn LFTs     Current Outpatient Prescriptions on File Prior to Visit  Medication Sig Dispense Refill  . acetaminophen (TYLENOL) 500 MG tablet Take 1,000 mg by mouth every 6 (six) hours as needed.      . Cholecalciferol 1000 UNITS tablet Take 1,000 Units by mouth daily.      . cyanocobalamin 500 MCG tablet Take 500 mcg by mouth daily.      Marland Kitchen glucose blood (ONE TOUCH ULTRA TEST) test strip USE ONCE DAILY AS NEEDED 50 each 3  . pravastatin (PRAVACHOL) 20 MG tablet TAKE 1 TABLET DAILY 90 tablet 0  . VITAMIN K PO Take by mouth.     No current facility-administered medications on file prior to visit.    Review of Systems    Constitutional: Negative for appetite change.  Eyes:       Negative for changes in vision.  Respiratory: Negative for chest tightness and shortness of breath.   Cardiovascular: Negative for chest pain, palpitations and leg swelling.  Endocrine: Negative for polydipsia, polyphagia and polyuria.  Neurological: Negative for headaches.      Objective:    BP 122/88 mmHg  Pulse 79  Temp(Src) 98.3 F (36.8 C) (Oral)  Resp 16  Ht 6\' 1"  (1.854 m)  Wt 280 lb (127.007 kg)  BMI 36.95 kg/m2  SpO2 97% Nursing note and vital signs reviewed.  Physical Exam  Constitutional: Kevin Miller is oriented to person, place, and time. Kevin Miller appears well-developed and well-nourished. No distress.  Cardiovascular: Normal rate, regular rhythm, normal heart sounds and intact distal pulses.   Pulmonary/Chest: Effort normal and breath sounds normal.  Musculoskeletal:  Diabetic Foot Exam - Simple   Simple Foot Form  Diabetic Foot exam was performed with the following findings:  Yes  02/10/2016  8:33 AM  Visual Inspection  No deformities, no ulcerations, no other skin breakdown bilaterally:  Yes  Sensation Testing  Intact to touch and monofilament testing bilaterally:  Yes  Pulse Check  Posterior Tibialis and Dorsalis pulse intact bilaterally:  Yes  Neurological: Kevin Miller is alert and oriented to person, place, and time.  Skin: Skin is warm and dry.  Psychiatric: Kevin Miller has a normal mood and affect. His behavior is normal. Judgment  and thought content normal.       Assessment & Plan:   Problem List Items Addressed This Visit      Cardiovascular and Mediastinum   Essential hypertension    Hypertension well controlled with current regimen and below goal 140/90. No adverse side effects. Continue current dosage of amlodipine, metoprolol, and lisinopril. Continue to monitor blood pressure at home as able. Follow-up in 6 months or sooner if needed.      Relevant Medications   amLODipine (NORVASC) 5 MG tablet   metoprolol  (LOPRESSOR) 50 MG tablet   lisinopril (PRINIVIL,ZESTRIL) 20 MG tablet   Other Relevant Orders   Basic Metabolic Panel (BMET)     Endocrine   Type II diabetes mellitus, uncontrolled ([place]) - Primary    Type 2 diabetes currently maintained on glipizide and metformin with most recent A1c of 7.3. Obtain A1c and basic metabolic profile. Continue current dosage of metformin and glipizide pending A1c results. Pneumovax updated today. Diabetic foot exam completed today. Encouraged to complete diabetic eye exam at his convenience. Maintained on pravastatin and lisinopril for CAD risk reduction. Follow-up pending A1c results.      Relevant Medications   glipiZIDE (GLUCOTROL) 5 MG tablet   lisinopril (PRINIVIL,ZESTRIL) 20 MG tablet   metFORMIN (GLUCOPHAGE) 500 MG tablet   Other Relevant Orders   Lipid panel   Hemoglobin 123456   Basic Metabolic Panel (BMET)     Other   DEPRESSION/ANXIETY   Relevant Medications   buPROPion (WELLBUTRIN SR) 150 MG 12 hr tablet    Other Visit Diagnoses    Screening for colon cancer        Relevant Orders    Ambulatory referral to Gastroenterology

## 2016-02-10 NOTE — Assessment & Plan Note (Signed)
Hypertension well controlled with current regimen and below goal 140/90. No adverse side effects. Continue current dosage of amlodipine, metoprolol, and lisinopril. Continue to monitor blood pressure at home as able. Follow-up in 6 months or sooner if needed.

## 2016-02-12 ENCOUNTER — Encounter: Payer: Self-pay | Admitting: Family

## 2016-02-12 ENCOUNTER — Other Ambulatory Visit: Payer: Self-pay | Admitting: Family

## 2016-02-12 DIAGNOSIS — E785 Hyperlipidemia, unspecified: Secondary | ICD-10-CM

## 2016-02-12 DIAGNOSIS — E781 Pure hyperglyceridemia: Secondary | ICD-10-CM

## 2016-02-24 ENCOUNTER — Encounter: Payer: Self-pay | Admitting: Family

## 2016-02-29 ENCOUNTER — Other Ambulatory Visit: Payer: Self-pay | Admitting: Family

## 2016-03-02 ENCOUNTER — Encounter: Payer: Self-pay | Admitting: Family

## 2016-03-05 ENCOUNTER — Other Ambulatory Visit: Payer: Self-pay | Admitting: Family

## 2016-03-05 MED ORDER — EMPAGLIFLOZIN 10 MG PO TABS: 10.0000 mg | ORAL_TABLET | Freq: Every day | ORAL | Status: DC

## 2016-03-07 ENCOUNTER — Other Ambulatory Visit: Payer: Self-pay | Admitting: Family

## 2016-03-07 ENCOUNTER — Telehealth: Payer: Self-pay

## 2016-03-07 NOTE — Telephone Encounter (Signed)
Wellbutrin has been refilled twice this month.

## 2016-03-07 NOTE — Telephone Encounter (Signed)
Patient is requesting refill on welbutrin, is this ok to do?

## 2016-03-09 ENCOUNTER — Ambulatory Visit (INDEPENDENT_AMBULATORY_CARE_PROVIDER_SITE_OTHER): Payer: 59 | Admitting: Internal Medicine

## 2016-03-09 ENCOUNTER — Encounter: Payer: Self-pay | Admitting: Internal Medicine

## 2016-03-09 ENCOUNTER — Encounter: Payer: Self-pay | Admitting: Gastroenterology

## 2016-03-09 VITALS — BP 144/96 | HR 66 | Ht 73.0 in | Wt 271.4 lb

## 2016-03-09 DIAGNOSIS — E785 Hyperlipidemia, unspecified: Secondary | ICD-10-CM | POA: Diagnosis not present

## 2016-03-09 DIAGNOSIS — E782 Mixed hyperlipidemia: Secondary | ICD-10-CM | POA: Diagnosis not present

## 2016-03-09 MED ORDER — FENOFIBRATE 145 MG PO TABS: 145.0000 mg | ORAL_TABLET | Freq: Every day | ORAL | Status: DC

## 2016-03-09 MED ORDER — FISH OIL 1000 MG PO CAPS: 4000.0000 mg | ORAL_CAPSULE | Freq: Every day | ORAL | Status: DC

## 2016-03-09 NOTE — Progress Notes (Signed)
Cardiology Office Note   Date:  03/09/2016   ID:  Kevin Miller, DOB May 23, 1961, MRN [id number]  PCP:  Kevin Po, FNP  Cardiologist:   Dorris Carnes, MD   No chief complaint on file.  Pt referred by Dr Kevin Miller for hypertryglceridemia   History of Present Illness: Kevin Miller is a 55 y.o. male with a history of DM, HTN, HL, pancreatitis, Fatty liver, Referred by Dr Kevin Miller for liipids    Triglverides won last check were 871 1 month ago    Breathing is good  Active with work  Works for [employer] \\No  CP       Outpatient Prescriptions Prior to Visit  Medication Sig Dispense Refill  . acetaminophen (TYLENOL) 500 MG tablet Take 1,000 mg by mouth every 6 (six) hours as needed for mild pain.     Marland Kitchen amLODipine (NORVASC) 5 MG tablet TAKE 1 TABLET (5 MG TOTAL) BY MOUTH DAILY. 90 tablet 3  . buPROPion (WELLBUTRIN SR) 150 MG 12 hr tablet TAKE 1 TABLET (150 MG TOTAL) BY MOUTH EVERY MORNING. 90 tablet 1  . Cholecalciferol 1000 UNITS tablet Take 1,000 Units by mouth daily.      . cyanocobalamin 500 MCG tablet Take 500 mcg by mouth daily.      . empagliflozin (JARDIANCE) 10 MG TABS tablet Take 10 mg by mouth daily. 30 tablet 0  . glipiZIDE (GLUCOTROL) 5 MG tablet Take 1 tablet (5 mg total) by mouth daily before breakfast. 30 tablet 2  . glucose blood (ONE TOUCH ULTRA TEST) test strip USE ONCE DAILY AS NEEDED 50 each 3  . lisinopril (PRINIVIL,ZESTRIL) 20 MG tablet TAKE 1 TABLET (20 MG TOTAL) BY MOUTH DAILY. 90 tablet 1  . metFORMIN (GLUCOPHAGE) 500 MG tablet TAKE 1 TABLET (500 MG TOTAL) BY MOUTH 3 (THREE) TIMES DAILY. 270 tablet 1  . metoprolol (LOPRESSOR) 50 MG tablet TAKE 1 TABLET (50 MG TOTAL) BY MOUTH 2 (TWO) TIMES DAILY. 180 tablet 1  . pravastatin (PRAVACHOL) 20 MG tablet TAKE 1 TABLET DAILY 90 tablet 0  . VITAMIN K Miller Take 1 tablet by mouth daily.     Marland Kitchen buPROPion (WELLBUTRIN SR) 150 MG 12 hr tablet TAKE 1 TABLET (150 MG TOTAL) BY MOUTH EVERY MORNING. (Patient not taking:  Reported on 03/09/2016) 30 tablet 0  . naproxen (NAPROSYN) 500 MG tablet Take 1 tablet (500 mg total) by mouth 2 (two) times daily as needed. (Patient not taking: Reported on 03/09/2016) 60 tablet 1   No facility-administered medications prior to visit.     Allergies:   Gemfibrozil   Past Medical History  Diagnosis Date  . Diabetes mellitus   . Hypertension   . Hyperlipidemia   . Pancreatitis 2010    ? etiol  . Pancreatic pseudocyst     Resolved  . Clostridium difficile infection 2010  . Vitamin B 12 deficiency   . Elevated LFTs 2011  . Fatty liver   . Osteopenia 2011  . Neck pain     Left  . Hypogonadism male 2012    Past Surgical History  Procedure Laterality Date  . Leg surgery      Left fracture  . Cholecystectomy    . Hernia repair       Social History:  The patient  reports that he has never smoked. He has never used smokeless tobacco. He reports that he does not drink alcohol.   Family History:  The patient's family history includes Alcohol abuse in his  father; COPD in his mother; Peripheral vascular disease in his mother.    ROS:  Please see the history of present illness. All other systems are reviewed and  Negative to the above problem except as noted.    PHYSICAL EXAM: VS:  BP 144/96 mmHg  Pulse 66  Ht 6\' 1"  (1.854 m)  Wt 271 lb 6.4 oz (123.106 kg)  BMI 35.81 kg/m2  GEN: Well nourished, well developed, in no acute distress HEENT: normal Neck: no JVD, carotid bruits, or masses Cardiac: RRR; no murmurs, rubs, or gallops,no edema  Respiratory:  clear to auscultation bilaterally, normal work of breathing GI: soft, nontender, nondistended, + BS  No hepatomegaly  MS: no deformity Moving all extremities   Skin: warm and dry, no rash Neuro:  Strength and sensation are intact Psych: euthymic mood, full affect   EKG:  EKG is ordered today.  SR 66 bpm     Lipid Panel    Component Value Date/Time   CHOL 187 02/10/2016 0849   TRIG * 02/10/2016 0849     871.0 Triglyceride is over 400; calculations on Lipids are invalid.   HDL 28.00* 02/10/2016 0849   CHOLHDL 7 02/10/2016 0849   VLDL 42.4* 02/25/2015 0841   LDLCALC 112* 12/29/2009 0915   LDLDIRECT 47.0 02/10/2016 0849      Wt Readings from Last 3 Encounters:  03/09/16 271 lb 6.4 oz (123.106 kg)  02/10/16 280 lb (127.007 kg)  08/26/15 276 lb (125.193 kg)      ASSESSMENT AND PLAN:  Dyslipidemia  I have reviewed with pharmacy  Would add tricor 145 and 4 g fish oil to regimen ([employer] brand)  FlU lipids in 10 wks  Will set f/u in lipid clinic after  2 DM  Instructed pt that he should be on glipizide and metormin and start Jardiance  Will check HgB A1C in 10 wks as well  3  HTN  BP is up today  He admits to being anxious  Will need to be followed   Signed, Dorris Carnes, MD  03/09/2016 3:42 PM    [address], [place], [place]  [address] Phone: [phone]; Fax: [phone]

## 2016-03-09 NOTE — Patient Instructions (Addendum)
Medication Instructions:  Start taking Tricor 145 mg daily and Fish oil 4000 mg daily-All other medications remain the same  Labwork: On 05/25/16-Lipids and HGB A1c. Please do not eat or drink after midnight the night before labs are draw.  Testing/Procedures: None  Follow-Up: With Lipid Clinic on a day after labs are done.     If you need a refill on your cardiac medications before your next appointment, please call your pharmacy.

## 2016-03-12 ENCOUNTER — Encounter: Payer: Self-pay | Admitting: Family

## 2016-03-12 ENCOUNTER — Other Ambulatory Visit: Payer: Self-pay

## 2016-03-12 DIAGNOSIS — F341 Dysthymic disorder: Secondary | ICD-10-CM

## 2016-03-12 MED ORDER — BUPROPION HCL ER (SR) 150 MG PO TB12: ORAL_TABLET | ORAL | Status: DC

## 2016-03-19 MED ORDER — BUPROPION HCL ER (SR) 150 MG PO TB12: ORAL_TABLET | ORAL | Status: DC

## 2016-03-19 NOTE — Telephone Encounter (Signed)
Resent wellbrutrin to [facility].../lmb

## 2016-03-29 ENCOUNTER — Other Ambulatory Visit: Payer: Self-pay | Admitting: Family

## 2016-04-04 ENCOUNTER — Other Ambulatory Visit: Payer: Self-pay | Admitting: Family

## 2016-04-16 ENCOUNTER — Encounter: Payer: Self-pay | Admitting: Gastroenterology

## 2016-04-16 ENCOUNTER — Ambulatory Visit (AMBULATORY_SURGERY_CENTER): Payer: Self-pay

## 2016-04-16 VITALS — Ht 73.0 in | Wt 267.0 lb

## 2016-04-16 DIAGNOSIS — Z8371 Family history of colonic polyps: Secondary | ICD-10-CM

## 2016-04-16 DIAGNOSIS — Z83719 Family history of colon polyps, unspecified: Secondary | ICD-10-CM

## 2016-04-16 MED ORDER — SUPREP BOWEL PREP KIT 17.5-3.13-1.6 GM/177ML PO SOLN: 1.0000 | Freq: Once | ORAL | Status: DC

## 2016-04-16 NOTE — Progress Notes (Signed)
No allergies to eggs or soy No past problems with anesthesia No home oxygen No diet meds  Has email and internet [email or web address]; registered for emmi

## 2016-04-19 ENCOUNTER — Other Ambulatory Visit: Payer: Self-pay | Admitting: Family

## 2016-04-30 ENCOUNTER — Other Ambulatory Visit: Payer: Self-pay | Admitting: Family

## 2016-04-30 ENCOUNTER — Ambulatory Visit (AMBULATORY_SURGERY_CENTER): Payer: 59 | Admitting: Gastroenterology

## 2016-04-30 ENCOUNTER — Encounter: Payer: Self-pay | Admitting: Gastroenterology

## 2016-04-30 VITALS — BP 123/66 | HR 70 | Temp 98.9°F | Resp 14 | Ht 73.0 in | Wt 267.0 lb

## 2016-04-30 DIAGNOSIS — Z1212 Encounter for screening for malignant neoplasm of rectum: Secondary | ICD-10-CM

## 2016-04-30 DIAGNOSIS — Z1211 Encounter for screening for malignant neoplasm of colon: Secondary | ICD-10-CM | POA: Diagnosis not present

## 2016-04-30 MED ORDER — SODIUM CHLORIDE 0.9 % IV SOLN: 500.0000 mL | INTRAVENOUS | Status: DC

## 2016-04-30 NOTE — Progress Notes (Signed)
Discharge instructions by Riki Sheer LPN

## 2016-04-30 NOTE — Patient Instructions (Signed)
YOU HAD AN ENDOSCOPIC PROCEDURE TODAY AT THE [facility]:   Refer to the procedure report that was given to you for any specific questions about what was found during the examination.  If the procedure report does not answer your questions, please call your gastroenterologist to clarify.  If you requested that your care partner not be given the details of your procedure findings, then the procedure report has been included in a sealed envelope for you to review at your convenience later.  YOU SHOULD EXPECT: Some feelings of bloating in the abdomen. Passage of more gas than usual.  Walking can help get rid of the air that was put into your GI tract during the procedure and reduce the bloating. If you had a lower endoscopy (such as a colonoscopy or flexible sigmoidoscopy) you may notice spotting of blood in your stool or on the toilet paper. If you underwent a bowel prep for your procedure, you may not have a normal bowel movement for a few days.  Please Note:  You might notice some irritation and congestion in your nose or some drainage.  This is from the oxygen used during your procedure.  There is no need for concern and it should clear up in a day or so.  SYMPTOMS TO REPORT IMMEDIATELY:   Following lower endoscopy (colonoscopy or flexible sigmoidoscopy):  Excessive amounts of blood in the stool  Significant tenderness or worsening of abdominal pains  Swelling of the abdomen that is new, acute  Fever of 100F or higher   For urgent or emergent issues, a gastroenterologist can be reached at any hour by calling [phone].   DIET: Your first meal following the procedure should be a small meal and then it is ok to progress to your normal diet. Heavy or fried foods are harder to digest and may make you feel nauseous or bloated.  Likewise, meals heavy in dairy and vegetables can increase bloating.  Drink plenty of fluids but you should avoid alcoholic beverages for 24  hours.  ACTIVITY:  You should plan to take it easy for the rest of today and you should NOT DRIVE or use heavy machinery until tomorrow (because of the sedation medicines used during the test).    FOLLOW UP: Our staff will call the number listed on your records the next business day following your procedure to check on you and address any questions or concerns that you may have regarding the information given to you following your procedure. If we do not reach you, we will leave a message.  However, if you are feeling well and you are not experiencing any problems, there is no need to return our call.  We will assume that you have returned to your regular daily activities without incident.  If any biopsies were taken you will be contacted by phone or by letter within the next 1-3 weeks.  Please call us at [phone] if you have not heard about the biopsies in 3 weeks.    SIGNATURES/CONFIDENTIALITY: You and/or your care partner have signed paperwork which will be entered into your electronic medical record.  These signatures attest to the fact that that the information above on your After Visit Summary has been reviewed and is understood.  Full responsibility of the confidentiality of this discharge information lies with you and/or your care-partner. 

## 2016-04-30 NOTE — Progress Notes (Signed)
Report to PACU, RN, vss, BBS= Clear.  

## 2016-04-30 NOTE — Op Note (Signed)
[facility] Patient Name: Kevin Miller Procedure Date: 04/30/2016 8:06 AM MRN: [id number] Endoscopist: Ladene Artist , MD Age: 55 Referring MD:  Date of Birth: Feb 27, 1961 Gender: Male Procedure:                Colonoscopy Indications:              Screening for colorectal malignant neoplasm Medicines:                Monitored Anesthesia Care Procedure:                Pre-Anesthesia Assessment:                           - Prior to the procedure, a History and Physical                            was performed, and patient medications and                            allergies were reviewed. The patient's tolerance of                            previous anesthesia was also reviewed. The risks                            and benefits of the procedure and the sedation                            options and risks were discussed with the patient.                            All questions were answered, and informed consent                            was obtained. Prior Anticoagulants: The patient has                            taken no previous anticoagulant or antiplatelet                            agents. ASA Grade Assessment: II - A patient with                            mild systemic disease. After reviewing the risks                            and benefits, the patient was deemed in                            satisfactory condition to undergo the procedure.                           After obtaining informed consent, the colonoscope  was passed under direct vision. Throughout the                            procedure, the patient's blood pressure, pulse, and                            oxygen saturations were monitored continuously. The                            Model PCF-[id number]) scope was introduced                            through the anus and advanced to the the cecum,                            identified by appendiceal orifice and  ileocecal                            valve. The ileocecal valve, appendiceal orifice,                            and rectum were photographed. The quality of the                            bowel preparation was excellent. The colonoscopy                            was performed without difficulty. The patient                            tolerated the procedure well. Scope In: 8:14:30 AM Scope Out: 8:27:36 AM Scope Withdrawal Time: 0 hours 11 minutes 28 seconds  Total Procedure Duration: 0 hours 13 minutes 6 seconds  Findings:                 The entire examined colon appeared normal on direct                            and retroflexion views. Complications:            No immediate complications. Estimated blood loss:                            None. Estimated Blood Loss:     Estimated blood loss: none. Impression:               - The entire examined colon is normal on direct and                            retroflexion views. Recommendation:           - Repeat colonoscopy in 10 years for screening                            purposes.                           -  Patient has a contact number available for                            emergencies. The signs and symptoms of potential                            delayed complications were discussed with the                            patient. Return to normal activities tomorrow.                            Written discharge instructions were provided to the                            patient.                           - Resume previous diet.                           - Continue present medications. Ladene Artist, MD 04/30/2016 8:31:12 AM This report has been signed electronically.

## 2016-05-01 ENCOUNTER — Telehealth: Payer: Self-pay

## 2016-05-01 NOTE — Telephone Encounter (Signed)
  Follow up Call-  Call back number 04/30/2016  Post procedure Call Back phone  # [phone]  Permission to leave phone message Yes     Patient questions:  Do you have a fever, pain , or abdominal swelling? No. Pain Score  0 *  Have you tolerated food without any problems? Yes.    Have you been able to return to your normal activities? Yes.    Do you have any questions about your discharge instructions: Diet   No. Medications  No. Follow up visit  No.  Do you have questions or concerns about your Care? No.  Actions: * If pain score is 4 or above: No action needed, pain <4.

## 2016-05-01 NOTE — Telephone Encounter (Signed)
  Follow up Call-  Call back number 04/30/2016  Post procedure Call Back phone  # [phone]  Permission to leave phone message Yes     Patient questions:  Do you have a fever, pain , or abdominal swelling? No. Pain Score  0 *  Have you tolerated food without any problems? Yes.    Have you been able to return to your normal activities? Yes.    Do you have any questions about your discharge instructions: Diet   No. Medications  No. Follow up visit  No.  Do you have questions or concerns about your Care? No.  Actions: * If pain score is 4 or above: No action needed, pain <4.

## 2016-05-18 ENCOUNTER — Ambulatory Visit: Payer: 59 | Admitting: Family

## 2016-05-18 ENCOUNTER — Other Ambulatory Visit: Payer: 59

## 2016-05-23 ENCOUNTER — Other Ambulatory Visit: Payer: Self-pay | Admitting: Internal Medicine

## 2016-05-23 ENCOUNTER — Other Ambulatory Visit: Payer: Self-pay | Admitting: *Deleted

## 2016-05-23 MED ORDER — FENOFIBRATE 145 MG PO TABS: 145.0000 mg | ORAL_TABLET | Freq: Every day | ORAL | Status: DC

## 2016-05-23 MED ORDER — EMPAGLIFLOZIN 10 MG PO TABS: 10.0000 mg | ORAL_TABLET | Freq: Every day | ORAL | Status: DC

## 2016-05-25 ENCOUNTER — Other Ambulatory Visit (INDEPENDENT_AMBULATORY_CARE_PROVIDER_SITE_OTHER): Payer: 59 | Admitting: *Deleted

## 2016-05-25 DIAGNOSIS — E785 Hyperlipidemia, unspecified: Secondary | ICD-10-CM | POA: Diagnosis not present

## 2016-05-25 DIAGNOSIS — E782 Mixed hyperlipidemia: Secondary | ICD-10-CM

## 2016-05-25 LAB — LIPID PANEL
CHOL/HDL RATIO: 5.9 ratio — AB (ref <=5.0)
Cholesterol: 160 mg/dL (ref 125–200)
HDL: 27 mg/dL — AB (ref >=40)
LDL CALC: 84 mg/dL (ref <130)
TRIGLYCERIDES: 245 mg/dL — AB (ref <150)
VLDL: 49 mg/dL — ABNORMAL HIGH (ref <30)

## 2016-05-25 LAB — HEMOGLOBIN A1C
HEMOGLOBIN A1C: 7.1 % — AB (ref <5.7)
Mean Plasma Glucose: 157 mg/dL

## 2016-05-30 ENCOUNTER — Other Ambulatory Visit: Payer: Self-pay | Admitting: Family

## 2016-06-01 ENCOUNTER — Ambulatory Visit (INDEPENDENT_AMBULATORY_CARE_PROVIDER_SITE_OTHER): Payer: 59 | Admitting: Pharmacist

## 2016-06-01 DIAGNOSIS — E785 Hyperlipidemia, unspecified: Secondary | ICD-10-CM

## 2016-06-01 MED ORDER — PRAVASTATIN SODIUM 40 MG PO TABS: 40.0000 mg | ORAL_TABLET | Freq: Every evening | ORAL | Status: DC

## 2016-06-01 NOTE — Patient Instructions (Signed)
Increase pravastatin 40mg  at night (take 2 of the 20mg  tablets until you run out).   Continue all of your other medications.   Recheck your labs in 3 months.  We will call you with the results.

## 2016-06-01 NOTE — Progress Notes (Signed)
Patient ID: Kevin Miller                 DOB: [birth year]                    MRN: [id number]     HPI: Kevin Miller is a 55 y.o. male patient referred to lipid clinic by Dr. Harrington Challenger due to hypertriglyceridemia.  He was first seen by Dr. Harrington Challenger in March.  His TG at that visit were 871 mg/dL.  He has a history of pancreatitis in 2010.  His PMH is significant for DM, HTN, and HLD.  No CAD.  At that visit, he mentioned there was some confusion with his diabetic medications so he had stopped taking his glipizide.   He restarted this as well as Jardiance in March.  A1c improved >1 pt in 3 months with addition of these medications.  He was also placed on Tricor 145mg  and fish oil 4 gm in addition to his pravastatin 20mg .    Current Medications: pravastatin 20mg , Tricor 145mg , fish oil 4gm  Intolerances: gemfibrozil (increased LFTs)  Risk Factors: DM, HTN LDL goal: <70 mg/dL, non-HDL < 100mg /dL  Labs: 05/25/16- TC 160, TG 245, HDL 27, LDL 84, A1c 7.1 (pravastatin 20, Tricor 145mg , fish oil 4 gm) 02/10/16- TC 187, TG 871, HDL 28, LDL no calculated (pravastatin 20mg )  Past Medical History  Diagnosis Date  . Diabetes mellitus   . Hypertension   . Hyperlipidemia   . Pancreatitis 2010    ? etiol  . Pancreatic pseudocyst     Resolved  . Clostridium difficile infection 2010  . Vitamin B 12 deficiency   . Elevated LFTs 2011  . Fatty liver   . Osteopenia 2011    back pain  . Neck pain     Left  . Hypogonadism male 2012  . Leg fracture, left     metal rod    Current Outpatient Prescriptions on File Prior to Visit  Medication Sig Dispense Refill  . acetaminophen (TYLENOL) 500 MG tablet Take 1,000 mg by mouth every 6 (six) hours as needed for mild pain.     Marland Kitchen amLODipine (NORVASC) 5 MG tablet TAKE 1 TABLET (5 MG TOTAL) BY MOUTH DAILY. 90 tablet 3  . b complex vitamins tablet Take 1 tablet by mouth daily.    Marland Kitchen buPROPion (WELLBUTRIN SR) 150 MG 12 hr tablet TAKE 1 TABLET (150 MG TOTAL) BY MOUTH  EVERY MORNING. 90 tablet 1  . Cholecalciferol 1000 UNITS tablet Take 1,000 Units by mouth daily.      . empagliflozin (JARDIANCE) 10 MG TABS tablet Take 10 mg by mouth daily. 90 tablet 0  . fenofibrate (TRICOR) 145 MG tablet Take 1 tablet (145 mg total) by mouth daily. 90 tablet 2  . glipiZIDE (GLUCOTROL) 5 MG tablet TAKE 1 TABLET (5 MG TOTAL) BY MOUTH DAILY BEFORE BREAKFAST. 30 tablet 0  . glucose blood (ONE TOUCH ULTRA TEST) test strip USE ONCE DAILY AS NEEDED 50 each 3  . lisinopril (PRINIVIL,ZESTRIL) 20 MG tablet TAKE 1 TABLET (20 MG TOTAL) BY MOUTH DAILY. 90 tablet 1  . metFORMIN (GLUCOPHAGE) 500 MG tablet TAKE 1 TABLET (500 MG TOTAL) BY MOUTH 3 (THREE) TIMES DAILY. 270 tablet 1  . metoprolol (LOPRESSOR) 50 MG tablet TAKE 1 TABLET (50 MG TOTAL) BY MOUTH 2 (TWO) TIMES DAILY. 180 tablet 1  . naproxen (NAPROSYN) 500 MG tablet Take 1 tablet by mouth 2 (two) times daily as needed. (pain)    .  Omega-3 Fatty Acids (FISH OIL) 1000 MG CAPS Take 4 capsules (4,000 mg total) by mouth daily.  0  . VITAMIN K PO Take 1 tablet by mouth daily.      No current facility-administered medications on file prior to visit.    Allergies  Allergen Reactions  . Gemfibrozil     REACTION: abn LFTs    Assessment/Plan:  1.  Hyperlipidemia- Pt's TG are much improved with addition of fenofibrate.  Pt tolerating therapy with no side effects.  LDL still above goal.  Will increase pravastatin to 40mg  daily and continue other meds.  If LDL still above goal in 3 months, would switch to higher potency statin. Pt agreeable to plan.

## 2016-06-08 ENCOUNTER — Ambulatory Visit (INDEPENDENT_AMBULATORY_CARE_PROVIDER_SITE_OTHER): Payer: 59 | Admitting: Family

## 2016-06-08 ENCOUNTER — Encounter: Payer: Self-pay | Admitting: Family

## 2016-06-08 VITALS — BP 126/84 | HR 69 | Temp 97.9°F | Resp 16 | Ht 73.0 in | Wt 269.0 lb

## 2016-06-08 DIAGNOSIS — E1165 Type 2 diabetes mellitus with hyperglycemia: Secondary | ICD-10-CM | POA: Diagnosis not present

## 2016-06-08 DIAGNOSIS — R2242 Localized swelling, mass and lump, left lower limb: Secondary | ICD-10-CM

## 2016-06-08 DIAGNOSIS — I10 Essential (primary) hypertension: Secondary | ICD-10-CM

## 2016-06-08 DIAGNOSIS — IMO0001 Reserved for inherently not codable concepts without codable children: Secondary | ICD-10-CM

## 2016-06-08 DIAGNOSIS — R2240 Localized swelling, mass and lump, unspecified lower limb: Secondary | ICD-10-CM | POA: Insufficient documentation

## 2016-06-08 MED ORDER — GLIPIZIDE 5 MG PO TABS
ORAL_TABLET | ORAL | Status: DC
Start: 2016-06-08 — End: 2016-11-29

## 2016-06-08 NOTE — Patient Instructions (Addendum)
Thank you for choosing [facility].  Summary/Instructions:  Continue to take your medications as prescribed.  They will call to schedule your podiatry appointment.   Your prescription(s) have been submitted to your pharmacy or been printed and provided for you. Please take as directed and contact our office if you believe you are having problem(s) with the medication(s) or have any questions.  If your symptoms worsen or fail to improve, please contact our office for further instruction, or in case of emergency go directly to the emergency room at the closest medical facility.

## 2016-06-08 NOTE — Progress Notes (Signed)
Subjective:    Patient ID: Kevin Miller, male    DOB: July 15, 1961, 55 y.o.   MRN: [id number]  Chief Complaint  Patient presents with  . Follow-up    diabetes and blood pressure    HPI:  Kevin Miller is a 55 y.o. male who  has a past medical history of Diabetes mellitus; Hypertension; Hyperlipidemia; Pancreatitis (2010); Pancreatic pseudocyst; Clostridium difficile infection (2010); Vitamin B 12 deficiency; Elevated LFTs (2011); Fatty liver; Osteopenia (2011); Neck pain; Hypogonadism male (2012); and Leg fracture, left. and presents today For a follow-up office visit  1.) Type 2 diabetes - Currently maintained on Jardiace, metformin and glipizide. Reports taking the medication as prescribed and denies adverse side effects. Home blood sugars have been improved. Denies symptoms of end organ damage. Currently monitors blood sugar at home with no log for review at present.   Lab Results  Component Value Date   HGBA1C 7.1* 05/25/2016    2.) Hypertension - currently maintained on amlodipine, lisinopril and metoprolol. Reports taking medication as prescribed and denies adverse side effects or hypotensive readings. Denies symptoms of end organ damage. Denies worse headache of life. Following a low-sodium diet.  BP Readings from Last 3 Encounters:  06/08/16 126/84  04/30/16 123/66  03/09/16 144/96    3.) Mass of foot - associated symptom of a mass located in the center of his left foot has been going on for approximately 2 years and has been worsening progressively over the last several months some discomfort noted. Does have significant history of injury to the same foot with tendons being repaired.   Allergies  Allergen Reactions  . Gemfibrozil     REACTION: abn LFTs     Current Outpatient Prescriptions on File Prior to Visit  Medication Sig Dispense Refill  . acetaminophen (TYLENOL) 500 MG tablet Take 1,000 mg by mouth every 6 (six) hours as needed for mild pain.     Marland Kitchen  amLODipine (NORVASC) 5 MG tablet TAKE 1 TABLET (5 MG TOTAL) BY MOUTH DAILY. 90 tablet 3  . b complex vitamins tablet Take 1 tablet by mouth daily.    Marland Kitchen buPROPion (WELLBUTRIN SR) 150 MG 12 hr tablet TAKE 1 TABLET (150 MG TOTAL) BY MOUTH EVERY MORNING. 90 tablet 1  . Cholecalciferol 1000 UNITS tablet Take 1,000 Units by mouth daily.      . empagliflozin (JARDIANCE) 10 MG TABS tablet Take 10 mg by mouth daily. 90 tablet 0  . fenofibrate (TRICOR) 145 MG tablet Take 1 tablet (145 mg total) by mouth daily. 90 tablet 2  . glucose blood (ONE TOUCH ULTRA TEST) test strip USE ONCE DAILY AS NEEDED 50 each 3  . lisinopril (PRINIVIL,ZESTRIL) 20 MG tablet TAKE 1 TABLET (20 MG TOTAL) BY MOUTH DAILY. 90 tablet 1  . metFORMIN (GLUCOPHAGE) 500 MG tablet TAKE 1 TABLET (500 MG TOTAL) BY MOUTH 3 (THREE) TIMES DAILY. 270 tablet 1  . metoprolol (LOPRESSOR) 50 MG tablet TAKE 1 TABLET (50 MG TOTAL) BY MOUTH 2 (TWO) TIMES DAILY. 180 tablet 1  . naproxen (NAPROSYN) 500 MG tablet Take 1 tablet by mouth 2 (two) times daily as needed. (pain)    . Omega-3 Fatty Acids (FISH OIL) 1000 MG CAPS Take 4 capsules (4,000 mg total) by mouth daily.  0  . pravastatin (PRAVACHOL) 40 MG tablet Take 1 tablet (40 mg total) by mouth every evening. 90 tablet 3  . VITAMIN K PO Take 1 tablet by mouth daily.  No current facility-administered medications on file prior to visit.     Past Surgical History  Procedure Laterality Date  . Leg surgery      Left fracture  . Cholecystectomy    . Hernia repair    . Elbow surgery      right  . Tibia fracture surgery      left    Past Medical History  Diagnosis Date  . Diabetes mellitus   . Hypertension   . Hyperlipidemia   . Pancreatitis 2010    ? etiol  . Pancreatic pseudocyst     Resolved  . Clostridium difficile infection 2010  . Vitamin B 12 deficiency   . Elevated LFTs 2011  . Fatty liver   . Osteopenia 2011    back pain  . Neck pain     Left  . Hypogonadism male 2012  .  Leg fracture, left     metal rod     Review of Systems  Constitutional: Negative for fever and chills.  Eyes:       Negative for changes in vision  Respiratory: Negative for cough, chest tightness and wheezing.   Cardiovascular: Negative for chest pain, palpitations and leg swelling.  Neurological: Negative for dizziness, weakness and light-headedness.      Objective:    BP 126/84 mmHg  Pulse 69  Temp(Src) 97.9 F (36.6 C) (Oral)  Resp 16  Ht 6\' 1"  (1.854 m)  Wt 269 lb (122.018 kg)  BMI 35.50 kg/m2  SpO2 97% Nursing note and vital signs reviewed.  Physical Exam  Constitutional: He is oriented to person, place, and time. He appears well-developed and well-nourished. No distress.  Cardiovascular: Normal rate, regular rhythm, normal heart sounds and intact distal pulses.   Pulmonary/Chest: Effort normal and breath sounds normal.  Musculoskeletal:  Left foot - small mass easily observed and palpated which is firm to the touch. There is mild tenderness. Range of motion of the toes is intact and appropriate. Pulses and sensation are intact and appropriate.  Neurological: He is alert and oriented to person, place, and time.  Skin: Skin is warm and dry.  Psychiatric: He has a normal mood and affect. His behavior is normal. Judgment and thought content normal.       Assessment & Plan:   Problem List Items Addressed This Visit      Cardiovascular and Mediastinum   Essential hypertension    Hypertension with adequate control and below goal 140/90. No adverse side effects of medication noted. Denies worse headache of life or symptoms of end organ damage. Continue current dosage of amlodipine, metoprolol, and lisinopril. Continue monitor blood pressure at home. Follow-up in 6 months or sooner if needed.        Endocrine   Type II diabetes mellitus, uncontrolled ([place])    Type 2 diabetes with improved control with most recent A1c of 7.1. No adverse side effects at this time or  hypoglycemic readings. Has restarted taking glipizide without difficulties. Will need to be monitored secondary to previous history of pancreatitis. Continue current dosage of Jardiance and metformin. Due for a diabetic eye exam encouraged to be completed independently. All other diabetic prevention is up-to-date. Maintained on lisinopril and pravastatin for CAD risk reduction. Follow-up in 6 months or sooner if needed.      Relevant Medications   glipiZIDE (GLUCOTROL) 5 MG tablet     Other   Mass of foot - Primary    Mass of left foot with concern for  cyst versus tendon. There is mild tenderness. Referral to podiatry placed secondary to diabetes.       Relevant Orders   Ambulatory referral to Podiatry       I am having Mr. Sherburne maintain his acetaminophen, Cholecalciferol, glucose blood, VITAMIN K PO, amLODipine, metoprolol, lisinopril, metFORMIN, naproxen, Fish Oil, buPROPion, b complex vitamins, fenofibrate, empagliflozin, pravastatin, and glipiZIDE.   Meds ordered this encounter  Medications  . glipiZIDE (GLUCOTROL) 5 MG tablet    Sig: TAKE 1 TABLET (5 MG TOTAL) BY MOUTH DAILY BEFORE BREAKFAST.    Dispense:  90 tablet    Refill:  1    Order Specific Question:  Supervising Provider    Answer:  Pricilla Holm A [id number]     Follow-up: Return in about 4 months (around 10/08/2016).  Mauricio Po, FNP

## 2016-06-08 NOTE — Assessment & Plan Note (Signed)
Hypertension with adequate control and below goal 140/90. No adverse side effects of medication noted. Denies worse headache of life or symptoms of end organ damage. Continue current dosage of amlodipine, metoprolol, and lisinopril. Continue monitor blood pressure at home. Follow-up in 6 months or sooner if needed.

## 2016-06-08 NOTE — Assessment & Plan Note (Signed)
Type 2 diabetes with improved control with most recent A1c of 7.1. No adverse side effects at this time or hypoglycemic readings. Has restarted taking glipizide without difficulties. Will need to be monitored secondary to previous history of pancreatitis. Continue current dosage of Jardiance and metformin. Due for a diabetic eye exam encouraged to be completed independently. All other diabetic prevention is up-to-date. Maintained on lisinopril and pravastatin for CAD risk reduction. Follow-up in 6 months or sooner if needed.

## 2016-06-08 NOTE — Progress Notes (Signed)
Pre visit review using our clinic review tool, if applicable. No additional management support is needed unless otherwise documented below in the visit note. 

## 2016-06-08 NOTE — Assessment & Plan Note (Signed)
Mass of left foot with concern for cyst versus tendon. There is mild tenderness. Referral to podiatry placed secondary to diabetes.

## 2016-06-25 ENCOUNTER — Other Ambulatory Visit: Payer: Self-pay | Admitting: Family

## 2016-07-27 ENCOUNTER — Ambulatory Visit (INDEPENDENT_AMBULATORY_CARE_PROVIDER_SITE_OTHER): Payer: 59

## 2016-07-27 ENCOUNTER — Telehealth: Payer: Self-pay | Admitting: *Deleted

## 2016-07-27 ENCOUNTER — Encounter: Payer: Self-pay | Admitting: Sports Medicine

## 2016-07-27 ENCOUNTER — Ambulatory Visit (INDEPENDENT_AMBULATORY_CARE_PROVIDER_SITE_OTHER): Payer: 59 | Admitting: Sports Medicine

## 2016-07-27 DIAGNOSIS — M799 Soft tissue disorder, unspecified: Secondary | ICD-10-CM

## 2016-07-27 DIAGNOSIS — M2012 Hallux valgus (acquired), left foot: Secondary | ICD-10-CM

## 2016-07-27 DIAGNOSIS — M79672 Pain in left foot: Secondary | ICD-10-CM

## 2016-07-27 DIAGNOSIS — M7989 Other specified soft tissue disorders: Secondary | ICD-10-CM

## 2016-07-27 DIAGNOSIS — M2040 Other hammer toe(s) (acquired), unspecified foot: Secondary | ICD-10-CM | POA: Diagnosis not present

## 2016-07-27 DIAGNOSIS — Z01812 Encounter for preprocedural laboratory examination: Secondary | ICD-10-CM

## 2016-07-27 DIAGNOSIS — M2011 Hallux valgus (acquired), right foot: Secondary | ICD-10-CM

## 2016-07-27 NOTE — Telephone Encounter (Addendum)
-----   Message from Landis Martins, [place] sent at 07/27/2016 10:23 AM EDT ----- Regarding: MRI patient Please schedule MRI for patient. Patient lives in [place].   MRI left foot.  Dx : soft tissue mass left plantar foot. Orders to [facility] for pre-cert.mri. [id number] MRI [id number] IS MEDICALLY NECESSARY, SERVICE REF #:  [id number], VALID 08/08/2016 -09/22/2016. FAXED TO [facility].03/29/2017-Received refill request for mobic. Dr. Trinidad Curet once, needs an appt.

## 2016-07-27 NOTE — Progress Notes (Signed)
Subjective: Kevin Miller is a 55 y.o. Diabetic male patient who presents to office for evaluation of Left foot pain. Patient states that he is concerned about mass at the bottom of his left foot, present over 1 year and has increased in size, has tried cushions and inserts and desires to discuss treatment options. Patient denies any other pedal complaints. Denies injury/trip/fall/sprain/any causative factors.   Admits to past hx of left ankle fracture, repaired years ago.   Patient Active Problem List   Diagnosis Date Noted  . Mass of foot 06/08/2016  . Actinic keratoses 11/17/2012  . Elbow stiffness 11/17/2012  . Abnormal EKG 09/18/2011  . HYPOGONADISM 02/16/2011  . SINUSITIS, ACUTE 02/16/2011  . SUNBURN 02/16/2011  . SNORING 02/16/2011  . OSTEOPENIA 12/15/2010  . NECK PAIN 11/15/2010  . LOW BACK PAIN, ACUTE 11/15/2010  . ARM PAIN 11/15/2010  . PARESTHESIA 11/15/2010  . FATTY LIVER DISEASE 10/27/2010  . LIVER FUNCTION TESTS, ABNORMAL, HX OF 10/27/2010  . DEPRESSION/ANXIETY 08/25/2010  . VITAMIN B12 DEFICIENCY 04/28/2010  . CHOLELITHIASIS 01/13/2010  . PANCREATITIS, ACUTE 12/29/2009  . PSEUDOCYST, PANCREAS 12/29/2009  . Type II diabetes mellitus, uncontrolled ([place]) 12/08/2009  . Hyperlipidemia 12/08/2009  . Essential hypertension 12/08/2009  . PANCREATITIS, HX OF 12/08/2009    Current Outpatient Prescriptions on File Prior to Visit  Medication Sig Dispense Refill  . acetaminophen (TYLENOL) 500 MG tablet Take 1,000 mg by mouth every 6 (six) hours as needed for mild pain.     Marland Kitchen amLODipine (NORVASC) 5 MG tablet TAKE 1 TABLET (5 MG TOTAL) BY MOUTH DAILY. 90 tablet 3  . b complex vitamins tablet Take 1 tablet by mouth daily.    Marland Kitchen buPROPion (WELLBUTRIN SR) 150 MG 12 hr tablet TAKE 1 TABLET (150 MG TOTAL) BY MOUTH EVERY MORNING. 90 tablet 1  . Cholecalciferol 1000 UNITS tablet Take 1,000 Units by mouth daily.      . empagliflozin (JARDIANCE) 10 MG TABS tablet Take 10 mg by mouth  daily. 90 tablet 0  . fenofibrate (TRICOR) 145 MG tablet Take 1 tablet (145 mg total) by mouth daily. 90 tablet 2  . glipiZIDE (GLUCOTROL) 5 MG tablet TAKE 1 TABLET (5 MG TOTAL) BY MOUTH DAILY BEFORE BREAKFAST. 90 tablet 1  . glucose blood (ONE TOUCH ULTRA TEST) test strip USE ONCE DAILY AS NEEDED 50 each 3  . lisinopril (PRINIVIL,ZESTRIL) 20 MG tablet TAKE 1 TABLET (20 MG TOTAL) BY MOUTH DAILY. 90 tablet 1  . metFORMIN (GLUCOPHAGE) 500 MG tablet TAKE 1 TABLET (500 MG TOTAL) BY MOUTH 3 (THREE) TIMES DAILY. 270 tablet 1  . metoprolol (LOPRESSOR) 50 MG tablet TAKE 1 TABLET (50 MG TOTAL) BY MOUTH 2 (TWO) TIMES DAILY. 180 tablet 1  . naproxen (NAPROSYN) 500 MG tablet TAKE 1 TABLET (500 MG TOTAL) BY MOUTH 2 (TWO) TIMES DAILY AS NEEDED. 60 tablet 1  . Omega-3 Fatty Acids (FISH OIL) 1000 MG CAPS Take 4 capsules (4,000 mg total) by mouth daily.  0  . pravastatin (PRAVACHOL) 40 MG tablet Take 1 tablet (40 mg total) by mouth every evening. 90 tablet 3  . VITAMIN K PO Take 1 tablet by mouth daily.      No current facility-administered medications on file prior to visit.     Allergies  Allergen Reactions  . Gemfibrozil     REACTION: abn LFTs    Objective:  General: Alert and oriented x3 in no acute distress  Dermatology: Focal raised 2cm soft tissue mass, well circumscribed, solid,  freely movable, non illuminating or pulsatile at plantar proximal 1st MTPJ on left, Small <0.5 cm small flat blanchable maccule at right medial arch non-concerning in nature as compared to left foot mass,  No open lesions bilateral lower extremities, no webspace macerations, no ecchymosis bilateral, all nails x 10 are well manicured.  Vascular: Dorsalis Pedis and Posterior Tibial pedal pulses palpable, Capillary Fill Time 3 seconds,(+) pedal hair growth bilateral, no edema bilateral lower extremities, Temperature gradient within normal limits.  Neurology: Johney Maine sensation intact via light touch bilateral, Protective  sensation intact with Thornell Mule Monofilament to all pedal sites, vibratory intact bilateral, No babinski sign present bilateral. (- )Tinels sign bilateral.   Musculoskeletal: Minimal tenderness with palpation at mass, left foot, No pain to right foot ,No pain with calf compression bilateral. Asymptomatic bunion and hammertoe deformity bilateral, Strength within normal limits in all groups bilateral.   Xrays  Left Foot   Impression: Normal osseous mineralization, bunion and hammertoe deformity, joint space narrowing and generalized changes suggestive of osteoarthritis, Hardware at ankle intact, no other acute findings.   Assessment and Plan: Problem List Items Addressed This Visit      Other   ARM PAIN   Relevant Orders   DG Foot 2 Views Left    Other Visit Diagnoses    Soft tissue mass    -  Primary   possible fibroma vs ganglion   Hallux valgus, acquired, bilateral       Hammertoe, unspecified laterality         -Complete examination performed -Xrays reviewed -Discussed treatement options for soft tissue mass left foot -Rx MRI for further evaluation. If his ankle hardware is not MRI compatible then would recommend [facility] ultrasound for further evaluation of mass -Recommend good supportive shoes and offloading inserts meanwhile -Patient to return to office after MRI to discuss results (excision vs local aspiration with steroid injection based on nature of mass) or sooner if condition worsens.  Landis Martins, DPM

## 2016-08-04 ENCOUNTER — Other Ambulatory Visit: Payer: Self-pay | Admitting: Family

## 2016-08-04 DIAGNOSIS — IMO0001 Reserved for inherently not codable concepts without codable children: Secondary | ICD-10-CM

## 2016-08-04 DIAGNOSIS — E1165 Type 2 diabetes mellitus with hyperglycemia: Principal | ICD-10-CM

## 2016-08-04 DIAGNOSIS — I10 Essential (primary) hypertension: Secondary | ICD-10-CM

## 2016-08-17 ENCOUNTER — Ambulatory Visit
Admission: RE | Admit: 2016-08-17 | Discharge: 2016-08-17 | Disposition: A | Discharge status: Home / Self Care | Payer: 59 | Source: Ambulatory Visit | Attending: Sports Medicine | Admitting: Sports Medicine

## 2016-08-17 DIAGNOSIS — M19072 Primary osteoarthritis, left ankle and foot: Secondary | ICD-10-CM | POA: Diagnosis not present

## 2016-08-17 DIAGNOSIS — M799 Soft tissue disorder, unspecified: Secondary | ICD-10-CM | POA: Insufficient documentation

## 2016-08-17 MED ORDER — GADOBENATE DIMEGLUMINE 529 MG/ML IV SOLN
20.0000 mL | Freq: Once | INTRAVENOUS | Status: AC | PRN
  Administered 2016-08-17: 20 mL via INTRAVENOUS

## 2016-08-31 ENCOUNTER — Other Ambulatory Visit: Payer: 59 | Admitting: *Deleted

## 2016-08-31 ENCOUNTER — Other Ambulatory Visit: Payer: Self-pay | Admitting: Family

## 2016-08-31 DIAGNOSIS — E785 Hyperlipidemia, unspecified: Secondary | ICD-10-CM

## 2016-08-31 LAB — HEPATIC FUNCTION PANEL
ALBUMIN: 4.1 g/dL (ref 3.6–5.1)
ALK PHOS: 93 U/L (ref 40–115)
ALT: 28 U/L (ref 9–46)
AST: 28 U/L (ref 10–35)
Bilirubin, Direct: 0.1 mg/dL (ref <=0.2)
Indirect Bilirubin: 0.3 mg/dL (ref 0.2–1.2)
TOTAL PROTEIN: 6.8 g/dL (ref 6.1–8.1)
Total Bilirubin: 0.4 mg/dL (ref 0.2–1.2)

## 2016-08-31 LAB — LIPID PANEL
CHOLESTEROL: 179 mg/dL (ref 125–200)
HDL: 21 mg/dL — AB (ref >=40)
TRIGLYCERIDES: 576 mg/dL — AB (ref <150)
Total CHOL/HDL Ratio: 8.5 Ratio — ABNORMAL HIGH (ref <=5.0)

## 2016-09-07 ENCOUNTER — Ambulatory Visit (INDEPENDENT_AMBULATORY_CARE_PROVIDER_SITE_OTHER): Payer: 59 | Admitting: Podiatry

## 2016-09-07 DIAGNOSIS — M799 Soft tissue disorder, unspecified: Secondary | ICD-10-CM | POA: Diagnosis not present

## 2016-09-07 DIAGNOSIS — M79672 Pain in left foot: Secondary | ICD-10-CM

## 2016-09-07 DIAGNOSIS — M7989 Other specified soft tissue disorders: Secondary | ICD-10-CM

## 2016-09-07 DIAGNOSIS — M722 Plantar fascial fibromatosis: Secondary | ICD-10-CM

## 2016-09-07 NOTE — Progress Notes (Signed)
Subjective: Patient presents today for follow-up evaluation of left foot pain secondary to a lump on the bottom of the left foot. Patient has had an MRI since the last visit. Patient presents today to review the MRI and discuss treatment options.   Objective: Physical Exam General: The patient is alert and oriented x3 in no acute distress.  Dermatology: Skin is warm, dry and supple bilateral lower extremities. Negative for open lesions or macerations.  Vascular: Palpable pedal pulses bilaterally. No edema or erythema noted. Capillary refill within normal limits.  Neurological: Epicritic and protective threshold grossly intact bilaterally.   Musculoskeletal Exam: Range of motion within normal limits to all pedal and ankle joints bilateral. Muscle strength 5/5 in all groups bilateral.   Assessment: #1 plantar fibroma left foot #2 pain in left foot Problem List Items Addressed This Visit    None    Visit Diagnoses   None.       Plan of Care:  #1 Patient was evaluated. #2 Discussed with patient the conservative versus surgical management of the plantar fibroma. Patient opted for surgical management. The patient is going to schedule surgery with the surgery coordinator. #3 return in 1 week postop     Dr. Edrick Kins, [place]

## 2016-09-07 NOTE — Patient Instructions (Signed)

## 2016-09-12 ENCOUNTER — Other Ambulatory Visit: Payer: Self-pay | Admitting: Family

## 2016-09-12 DIAGNOSIS — F341 Dysthymic disorder: Secondary | ICD-10-CM

## 2016-09-17 ENCOUNTER — Ambulatory Visit (INDEPENDENT_AMBULATORY_CARE_PROVIDER_SITE_OTHER): Payer: 59 | Admitting: Pharmacist

## 2016-09-17 ENCOUNTER — Encounter: Payer: Self-pay | Admitting: Pharmacist

## 2016-09-17 VITALS — Wt 274.8 lb

## 2016-09-17 DIAGNOSIS — E785 Hyperlipidemia, unspecified: Secondary | ICD-10-CM

## 2016-09-17 NOTE — Patient Instructions (Addendum)
Move pravastatin to evening due to highest production of cholesterol at night  Restart Fish Oil 4gm (2gm twice daily)  Cholesterol Cholesterol is a white, waxy, fat-like substance needed by your body in small amounts. The liver makes all the cholesterol you need. Cholesterol is carried from the liver by the blood through the blood vessels. Deposits of cholesterol (plaque) may build up on blood vessel walls. These make the arteries narrower and stiffer. Cholesterol plaques increase the risk for heart attack and stroke.  You cannot feel your cholesterol level even if it is very high. The only way to know it is high is with a blood test. Once you know your cholesterol levels, you should keep a record of the test results. Work with your health care provider to keep your levels in the desired range.  WHAT DO THE RESULTS MEAN?  Total cholesterol is a rough measure of all the cholesterol in your blood.   LDL is the so-called bad cholesterol. This is the type that deposits cholesterol in the walls of the arteries. You want this level to be low.   HDL is the good cholesterol because it cleans the arteries and carries the LDL away. You want this level to be high.  Triglycerides are fat that the body can either burn for energy or store. High levels are closely linked to heart disease.  WHAT ARE THE DESIRED LEVELS OF CHOLESTEROL?  Total cholesterol below 200.   LDL below 100 for people at risk, below 70 for those at very high risk.   HDL above 50 is good, above 60 is best.   Triglycerides below 150.  HOW CAN I LOWER MY CHOLESTEROL?  Diet. Follow your diet programs as directed by your health care provider.   Choose fish or white meat chicken and [place], roasted or baked. Limit fatty cuts of red meat, fried foods, and processed meats, such as sausage and lunch meats.   Eat lots of fresh fruits and vegetables.  Choose whole grains, beans, pasta, potatoes, and cereals.   Use only small  amounts of olive, corn, or canola oils.   Avoid butter, mayonnaise, shortening, or palm kernel oils.  Avoid foods with trans fats.   Drink skim or nonfat milk and eat low-fat or nonfat yogurt and cheeses. Avoid whole milk, cream, ice cream, egg yolks, and full-fat cheeses.   Healthy desserts include angel food cake, ginger snaps, animal crackers, hard candy, popsicles, and low-fat or nonfat frozen yogurt. Avoid pastries, cakes, pies, and cookies.   Exercise. Follow your exercise programs as directed by your health care provider.   A regular program helps decrease LDL and raise HDL.   A regular program helps with weight control.   Do things that increase your activity level like gardening, walking, or taking the stairs. Ask your health care provider about how you can be more active in your daily life.   Medicine. Take medicine only as directed by your health care provider.   Medicine may be prescribed by your health care provider to help lower cholesterol and decrease the risk for heart disease.   If you have several risk factors, you may need medicine even if your levels are normal.   This information is not intended to replace advice given to you by your health care provider. Make sure you discuss any questions you have with your health care provider.   Document Released: 08/21/2001 Document Revised: 12/17/2014 Document Reviewed: 09/09/2013 Elsevier Interactive Patient Education [employer].

## 2016-09-17 NOTE — Progress Notes (Signed)
Patient ID: Kevin Miller                 DOB: 1961-04-06                    MRN: [id number]     HPI: Kevin Miller is a 55 y.o. male patient of Dr. Harrington Challenger with [facility] that presents today for hypertriglyceridemia. He was first seen by Dr. Harrington Challenger in March.  His TG at that visit were 871 mg/dL.  He has a history of pancreatitis in 2010.  His PMH is significant for DM, HTN, and HLD.  No CAD.  At that visit, he mentioned there was some confusion with his diabetic medications so he had stopped taking his glipizide.   He restarted this as well as Jardiance in March 2017.  A1c improved >1 pt in 3 months with addition of these medications.  He was also placed on Tricor 145mg  and fish oil 4 gm in addition to his pravastatin.  Since this time his pravastatin was increased to 40mg  daily.  Today, He reports that his most recent labs were after a 2 [place] stay at the beach. He endorses extreme dietary indiscretions and that his sugars ran high the few times that he checked them. He states compliance with pravastatin and Tricor, but he has not been taking fish oil for about 2 months. He states "it just seemed like a lot of capsules."   Current Medications: pravastatin 40mg , Tricor 145mg , fish oil 4gm  Intolerances: gemfibrozil (increased LFTs)   Risk Factors: DM, HTN LDL goal: <70 mg/dL, non-HDL < 100mg /dL  Labs: 08/31/16- TC 179, TG 576, HDL 21, LDL not calculated (pravastatin 40mg , Tricor 145mg ) 05/25/16- TC 160, TG 245, HDL 27, LDL 84, A1c 7.1 (pravastatin 20, Tricor 145mg , fish oil 4 gm) 02/10/16- TC 187, TG 871, HDL 28, LDL no calculated (pravastatin 20mg )   Past Medical History:  Diagnosis Date  . Clostridium difficile infection 2010  . Diabetes mellitus   . Elevated LFTs 2011  . Fatty liver   . Hyperlipidemia   . Hypertension   . Hypogonadism male 2012  . Leg fracture, left    metal rod  . Neck pain    Left  . Osteopenia 2011   back pain  . Pancreatic pseudocyst    Resolved  .  Pancreatitis 2010   ? etiol  . Vitamin B 12 deficiency     Current Outpatient Prescriptions on File Prior to Visit  Medication Sig Dispense Refill  . acetaminophen (TYLENOL) 500 MG tablet Take 1,000 mg by mouth every 6 (six) hours as needed for mild pain.     Marland Kitchen amLODipine (NORVASC) 5 MG tablet TAKE 1 TABLET (5 MG TOTAL) BY MOUTH DAILY. 90 tablet 3  . b complex vitamins tablet Take 1 tablet by mouth daily.    Marland Kitchen buPROPion (WELLBUTRIN SR) 150 MG 12 hr tablet TAKE 1 TABLET (150 MG TOTAL) BY MOUTH EVERY MORNING. 90 tablet 1  . Cholecalciferol 1000 UNITS tablet Take 1,000 Units by mouth daily.      . empagliflozin (JARDIANCE) 10 MG TABS tablet Take 10 mg by mouth daily. 90 tablet 0  . fenofibrate (TRICOR) 145 MG tablet Take 1 tablet (145 mg total) by mouth daily. 90 tablet 2  . glipiZIDE (GLUCOTROL) 5 MG tablet TAKE 1 TABLET (5 MG TOTAL) BY MOUTH DAILY BEFORE BREAKFAST. 90 tablet 1  . glucose blood (ONE TOUCH ULTRA TEST) test strip USE ONCE DAILY AS  NEEDED 50 each 3  . lisinopril (PRINIVIL,ZESTRIL) 20 MG tablet TAKE 1 TABLET (20 MG TOTAL) BY MOUTH DAILY. 90 tablet 1  . metFORMIN (GLUCOPHAGE) 500 MG tablet TAKE 1 TABLET (500 MG TOTAL) BY MOUTH 3 (THREE) TIMES DAILY. 270 tablet 1  . metoprolol (LOPRESSOR) 50 MG tablet TAKE 1 TABLET (50 MG TOTAL) BY MOUTH 2 (TWO) TIMES DAILY. 180 tablet 1  . naproxen (NAPROSYN) 500 MG tablet TAKE 1 TABLET (500 MG TOTAL) BY MOUTH 2 (TWO) TIMES DAILY AS NEEDED. 60 tablet 1  . Omega-3 Fatty Acids (FISH OIL) 1000 MG CAPS Take 4 capsules (4,000 mg total) by mouth daily.  0  . pravastatin (PRAVACHOL) 40 MG tablet Take 1 tablet (40 mg total) by mouth every evening. 90 tablet 3  . VITAMIN K PO Take 1 tablet by mouth daily.      No current facility-administered medications on file prior to visit.     Allergies  Allergen Reactions  . Gemfibrozil     REACTION: abn LFTs    Assessment/Plan: Hyperlipidemia: TG are above 500. LDL is not calculated due to elevated TG.  Extensive discussion about diet and importance of moderation and being mindful of choices even while on vacation. Will restart fish oil 2gm BID. Will also have him move his pravastatin to evening since cholesterol production is highest in the evening. He may benefit from higher potency statin in the future, but will defer this change at this time. He will get repeat lipid panel in 3 months. Follow up in lipid clinic after panel.   Thank you,  Lelan Pons. Patterson Hammersmith, [facility]  09/17/2016 8:05 AM

## 2016-09-18 ENCOUNTER — Other Ambulatory Visit: Payer: 59

## 2016-09-21 ENCOUNTER — Telehealth: Payer: Self-pay | Admitting: *Deleted

## 2016-09-21 NOTE — Telephone Encounter (Signed)
"  I go to the [facility] office.  I need to set up my surgery."

## 2016-09-24 NOTE — Telephone Encounter (Signed)
"  I need to schedule surgery with Dr. Amalia Hailey on November 16."  He will not be able to schedule for that day.  He only has dates available the second and fourth Thursday of each month.  I received a call from the surgical center stating this today.  They are supposed to speak to Dr. Amalia Hailey regarding this today.  He can do it on November 30 or December 14 or 28.  "Okay, I will call you back tomorrow to see what the plan is.  I will check my schedule to see if I can do any of the other dates.  I wanted that time because I would have 7 consecutive days off leading into Thanksgiving."  The end.  Note complete

## 2016-10-02 ENCOUNTER — Other Ambulatory Visit: Payer: Self-pay

## 2016-10-02 MED ORDER — NAPROXEN 500 MG PO TABS: ORAL_TABLET | ORAL | 1 refills | Status: DC

## 2016-10-08 NOTE — Telephone Encounter (Signed)
I am calling to let you know that Dr. Amalia Hailey will be able to do your surgery on November 16 after all if you would still like that date.  "Let me check with my job on tomorrow and give you a call back."

## 2016-10-15 NOTE — Telephone Encounter (Signed)
"  I am calling you back.  I know it took a while to get back to you.  I am not going to be able to do the 16th.  Can we schedule it for December 28?"  That date is available I will get it scheduled.  "Is there anything else I need to do?"  You need to register with the surgical center, instructions are in the brochure that was given to you in the blue bag.

## 2016-11-29 ENCOUNTER — Other Ambulatory Visit: Payer: Self-pay | Admitting: Family

## 2016-12-01 ENCOUNTER — Other Ambulatory Visit: Payer: Self-pay | Admitting: Family

## 2016-12-06 ENCOUNTER — Encounter: Payer: Self-pay | Admitting: Podiatry

## 2016-12-06 DIAGNOSIS — D492 Neoplasm of unspecified behavior of bone, soft tissue, and skin: Secondary | ICD-10-CM | POA: Diagnosis not present

## 2016-12-07 ENCOUNTER — Ambulatory Visit (INDEPENDENT_AMBULATORY_CARE_PROVIDER_SITE_OTHER): Payer: 59 | Admitting: Family

## 2016-12-07 ENCOUNTER — Encounter: Payer: Self-pay | Admitting: Family

## 2016-12-07 VITALS — BP 120/82 | HR 75 | Temp 97.5°F | Resp 16 | Ht 73.0 in | Wt 242.0 lb

## 2016-12-07 DIAGNOSIS — I10 Essential (primary) hypertension: Secondary | ICD-10-CM

## 2016-12-07 DIAGNOSIS — E1165 Type 2 diabetes mellitus with hyperglycemia: Secondary | ICD-10-CM | POA: Diagnosis not present

## 2016-12-07 DIAGNOSIS — IMO0001 Reserved for inherently not codable concepts without codable children: Secondary | ICD-10-CM

## 2016-12-07 LAB — POCT GLYCOSYLATED HEMOGLOBIN (HGB A1C): Hemoglobin A1C: 6.9

## 2016-12-07 NOTE — Assessment & Plan Note (Signed)
Diabetes with continued improving control with new A1c of 6.9 in office today. No adverse side effects of current medications. Diabetic eye exam encouraged to be completed independently. All other diabetic screenings are up-to-date per recommendations. Continue current dosage of glipizide, Jardiance, and metformin. Will require a change from Kevin Miller secondary to insurance coverage. Patient will contact insurance company for formulary coverage with appropriate changes as needed. Continue to monitor.

## 2016-12-07 NOTE — Progress Notes (Signed)
Subjective:    Patient ID: Kevin Miller, male    DOB: 16-Jul-1961, 55 y.o.   MRN: [id number]  Chief Complaint  Patient presents with  . Follow-up    needs to replace jardiance insurance will not cover next year, thinks he is having a reaction to medication, having nausea and sweating    HPI:  Kevin Miller is a 55 y.o. male who  has a past medical history of Clostridium difficile infection (2010); Diabetes mellitus; Elevated LFTs (2011); Fatty liver; Hyperlipidemia; Hypertension; Hypogonadism male (2012); Leg fracture, left; Neck pain; Osteopenia (2011); Pancreatic pseudocyst; Pancreatitis (2010); and Vitamin B 12 deficiency. and presents today for a follow up office visit.  1.) Type 2 diabetes - Currently maintained on metformin, glipizide and Jardiance. Reports taking the medication as prescribed and denies adverse side effects or hypoglycemic readings. Denies any changes in vision or new symptoms of end organ damage. Indicates that Vania Rea will no longer be covered in January.   Lab Results  Component Value Date   HGBA1C 6.9 12/07/2016   2.) Hypertension - currently maintained on lisinopril, amlodipine, and metoprolol. Reports taken medication as prescribed. Has the mild side effect of a cough on occasion which she indicates is tolerable. Denies hypotensive readings. Denies worse headache of life with no new symptoms of end organ damage noted.  BP Readings from Last 3 Encounters:  12/07/16 120/82  06/08/16 126/84  04/30/16 123/66     Allergies  Allergen Reactions  . Gemfibrozil     REACTION: abn LFTs      Outpatient Medications Prior to Visit  Medication Sig Dispense Refill  . acetaminophen (TYLENOL) 500 MG tablet Take 1,000 mg by mouth every 6 (six) hours as needed for mild pain.     Marland Kitchen amLODipine (NORVASC) 5 MG tablet TAKE 1 TABLET (5 MG TOTAL) BY MOUTH DAILY. 90 tablet 3  . b complex vitamins tablet Take 1 tablet by mouth daily.    Marland Kitchen buPROPion (WELLBUTRIN SR)  150 MG 12 hr tablet TAKE 1 TABLET (150 MG TOTAL) BY MOUTH EVERY MORNING. 90 tablet 1  . Cholecalciferol 1000 UNITS tablet Take 1,000 Units by mouth daily.      . fenofibrate (TRICOR) 145 MG tablet Take 1 tablet (145 mg total) by mouth daily. 90 tablet 2  . glipiZIDE (GLUCOTROL) 5 MG tablet TAKE 1 TABLET (5 MG TOTAL) BY MOUTH DAILY BEFORE BREAKFAST. 90 tablet 1  . glucose blood (ONE TOUCH ULTRA TEST) test strip USE ONCE DAILY AS NEEDED 50 each 3  . JARDIANCE 10 MG TABS tablet TAKE 1 TABLET BY MOUTH DAILY 90 tablet 0  . lisinopril (PRINIVIL,ZESTRIL) 20 MG tablet TAKE 1 TABLET (20 MG TOTAL) BY MOUTH DAILY. 90 tablet 1  . metFORMIN (GLUCOPHAGE) 500 MG tablet TAKE 1 TABLET (500 MG TOTAL) BY MOUTH 3 (THREE) TIMES DAILY. 270 tablet 1  . metoprolol (LOPRESSOR) 50 MG tablet TAKE 1 TABLET (50 MG TOTAL) BY MOUTH 2 (TWO) TIMES DAILY. 180 tablet 1  . naproxen (NAPROSYN) 500 MG tablet TAKE 1 TABLET (500 MG TOTAL) BY MOUTH 2 (TWO) TIMES DAILY AS NEEDED. 180 tablet 1  . Omega-3 Fatty Acids (FISH OIL) 1000 MG CAPS Take 4 capsules (4,000 mg total) by mouth daily.  0  . pravastatin (PRAVACHOL) 40 MG tablet Take 1 tablet (40 mg total) by mouth every evening. 90 tablet 3  . VITAMIN K PO Take 1 tablet by mouth daily.      No facility-administered medications prior to visit.  Review of Systems  Eyes:       Negative for changes in vision.  Respiratory: Negative for chest tightness and shortness of breath.   Cardiovascular: Negative for chest pain, palpitations and leg swelling.  Endocrine: Negative for polydipsia, polyphagia and polyuria.  Neurological: Negative for dizziness, weakness, light-headedness and headaches.      Objective:    BP 120/82 (BP Location: Left Arm, Patient Position: Sitting, Cuff Size: Large)   Pulse 75   Temp 97.5 F (36.4 C) (Oral)   Resp 16   Ht 6\' 1"  (1.854 m)   Wt 242 lb (109.8 kg)   SpO2 96%   BMI 31.93 kg/m  Nursing note and vital signs reviewed.  Physical Exam    Constitutional: He is oriented to person, place, and time. He appears well-developed and well-nourished. No distress.  Cardiovascular: Normal rate, regular rhythm, normal heart sounds and intact distal pulses.   Pulmonary/Chest: Effort normal and breath sounds normal.  Neurological: He is alert and oriented to person, place, and time.  Skin: Skin is warm and dry.  Psychiatric: He has a normal mood and affect. His behavior is normal. Judgment and thought content normal.       Assessment & Plan:   Problem List Items Addressed This Visit      Cardiovascular and Mediastinum   Essential hypertension    Hypertension appears adequately controlled current regimen with mild cough as a side effect and no hypotensive readings. Denies worse headache of life with no new symptoms of end organ damage noted on physical exam. Continue to monitor blood pressure at home and follow sodium diet. Continue current dosage of amlodipine and lisinopril.        Endocrine   Type II diabetes mellitus, uncontrolled ([place]) - Primary    Diabetes with continued improving control with new A1c of 6.9 in office today. No adverse side effects of current medications. Diabetic eye exam encouraged to be completed independently. All other diabetic screenings are up-to-date per recommendations. Continue current dosage of glipizide, Jardiance, and metformin. Will require a change from [place] secondary to insurance coverage. Patient will contact insurance company for formulary coverage with appropriate changes as needed. Continue to monitor.      Relevant Orders   POCT HgB A1C (Completed)       I am having Kevin Miller maintain his acetaminophen, Cholecalciferol, glucose blood, VITAMIN K PO, amLODipine, metoprolol, Fish Oil, b complex vitamins, fenofibrate, pravastatin, metFORMIN, lisinopril, buPROPion, naproxen, glipiZIDE, and JARDIANCE.    Follow-up: Return in about 4 months (around 04/07/2017), or if symptoms worsen or  fail to improve.  Mauricio Po, FNP

## 2016-12-07 NOTE — Patient Instructions (Addendum)
Thank you for choosing [facility].  SUMMARY AND INSTRUCTIONS:  Medication:  Please continue to take your medication as prescribed.   Check on your formulary to see what diabetic medications are covered.  Follow up:  If your symptoms worsen or fail to improve, please contact our office for further instruction, or in case of emergency go directly to the emergency room at the closest medical facility.    DIABETES CARE INSTRUCTIONS:  Current A1c:  Lab Results  Component Value Date   HGBA1C 6.9 12/07/2016    A1C Goal: <7.0%  Fasting Blood Sugar Goal: 80-130 Blood sugar check that you take after fasting for at least 8 hours which is usually in the morning before eating.  Post-prandial Blood Sugar Goal: <180 Blood sugar check approximately 1-2 hours after the start of a meal.   Dosing of Long Acting Insulin:  1. Check and record your blood sugar each morning before eating.   2. Increase your current dosage by 2 units every 3 days until your fasting blood sugar is between 80-130. Alternatively you may increase 1 unit daily until your fasting blood sugar is between 80-130.  NOTE: If your blood sugars are less than 80, then decrease your dosage 2 units daily until your blood sugars are back in range.   Diabetes Prevention Screens:  1.) Ensure you have an annual eye exam by an ophthalmologist or optometrist.   2,) Ensure you have an annual foot exam or when any changes are noted including new onset numbness/tingling or wounds. Check your feet daily!  3.) The American Diabetes Association recommends the Pneumovax vaccination against pneumonia every 5 years.  4.) We will check your kidney function with a urine test on an annual basis.

## 2016-12-07 NOTE — Assessment & Plan Note (Signed)
Hypertension appears adequately controlled current regimen with mild cough as a side effect and no hypotensive readings. Denies worse headache of life with no new symptoms of end organ damage noted on physical exam. Continue to monitor blood pressure at home and follow sodium diet. Continue current dosage of amlodipine and lisinopril.

## 2016-12-13 ENCOUNTER — Encounter: Payer: Self-pay | Admitting: Podiatry

## 2016-12-14 ENCOUNTER — Ambulatory Visit (INDEPENDENT_AMBULATORY_CARE_PROVIDER_SITE_OTHER): Payer: 59 | Admitting: Podiatry

## 2016-12-14 DIAGNOSIS — Z9889 Other specified postprocedural states: Secondary | ICD-10-CM

## 2016-12-14 NOTE — Progress Notes (Signed)
Subjective: Patient presents today status post fibroma excision left foot. Date of surgery December 2018 and 17. Patient states he is doing very good. Next  Objective: Skin incisions are well coapted with sutures intact. No sign of infectious process.  Assessment: Status post excision of plantar fibroma left foot. Date of surgery 12/06/2016. Next  Plan of care: Today patient was evaluated. Dressings were changed. Return to clinic in 2 weeks for suture removal. Postoperative shoe dispensed today. Patient states that the Darco wedge shoe causes knee and hip pain.

## 2016-12-18 ENCOUNTER — Other Ambulatory Visit: Payer: 59 | Admitting: *Deleted

## 2016-12-18 DIAGNOSIS — E785 Hyperlipidemia, unspecified: Secondary | ICD-10-CM | POA: Diagnosis not present

## 2016-12-18 NOTE — Progress Notes (Signed)
D

## 2016-12-18 NOTE — Addendum Note (Signed)
Addended by: Eulis Foster on: 12/18/2016 10:16 AM   Modules accepted: Orders

## 2016-12-19 LAB — HEPATIC FUNCTION PANEL
ALBUMIN: 4.4 g/dL (ref 3.5–5.5)
ALK PHOS: 94 IU/L (ref 39–117)
ALT: 29 IU/L (ref 0–44)
AST: 26 IU/L (ref 0–40)
BILIRUBIN, DIRECT: 0.1 mg/dL (ref 0.00–0.40)
Bilirubin Total: 0.2 mg/dL (ref 0.0–1.2)
TOTAL PROTEIN: 7.2 g/dL (ref 6.0–8.5)

## 2016-12-19 LAB — LIPID PANEL
Chol/HDL Ratio: 5.9 ratio units — ABNORMAL HIGH (ref 0.0–5.0)
Cholesterol, Total: 165 mg/dL (ref 100–199)
HDL: 28 mg/dL — ABNORMAL LOW (ref >39)
Triglycerides: 403 mg/dL — ABNORMAL HIGH (ref 0–149)

## 2016-12-21 ENCOUNTER — Other Ambulatory Visit: Payer: 59

## 2016-12-28 ENCOUNTER — Ambulatory Visit (INDEPENDENT_AMBULATORY_CARE_PROVIDER_SITE_OTHER): Payer: Self-pay | Admitting: Podiatry

## 2016-12-28 DIAGNOSIS — Z9889 Other specified postprocedural states: Secondary | ICD-10-CM

## 2016-12-28 DIAGNOSIS — M7989 Other specified soft tissue disorders: Secondary | ICD-10-CM

## 2016-12-28 DIAGNOSIS — M799 Soft tissue disorder, unspecified: Secondary | ICD-10-CM

## 2017-01-01 ENCOUNTER — Other Ambulatory Visit: Payer: Self-pay | Admitting: *Deleted

## 2017-01-01 DIAGNOSIS — E785 Hyperlipidemia, unspecified: Secondary | ICD-10-CM

## 2017-01-01 MED ORDER — ROSUVASTATIN CALCIUM 5 MG PO TABS: 5.0000 mg | ORAL_TABLET | Freq: Every day | ORAL | 3 refills | Status: DC

## 2017-01-04 ENCOUNTER — Ambulatory Visit (INDEPENDENT_AMBULATORY_CARE_PROVIDER_SITE_OTHER): Payer: Self-pay | Admitting: Podiatry

## 2017-01-04 ENCOUNTER — Encounter: Payer: Self-pay | Admitting: Podiatry

## 2017-01-04 DIAGNOSIS — Z9889 Other specified postprocedural states: Secondary | ICD-10-CM

## 2017-01-04 MED ORDER — TRAMADOL HCL 50 MG PO TABS: 50.0000 mg | ORAL_TABLET | Freq: Three times a day (TID) | ORAL | 0 refills | Status: DC | PRN

## 2017-01-06 NOTE — Progress Notes (Signed)
Subjective: Patient presents today status post fibroma excision left foot. Date of surgery 12/06/2016. Patient states he is doing very good.   Objective: Skin incisions are well coapted with sutures intact. No sign of infectious process. There is palpable scar tissue noted underlying the incision site  Assessment: Status post excision of plantar fibroma left foot. Date of surgery 12/06/2016.  Plan of care: Today patient was evaluated. Sutures were removed. Return to work on 01/06/2017. Return to clinic in 10 days

## 2017-01-07 NOTE — Progress Notes (Signed)
DOS 12.28.2017 Excision of plantar fibroma left foot.

## 2017-01-13 NOTE — Progress Notes (Signed)
SUbjective: Patient presents today status post fibroma excision left foot. Date of surgery 12/06/2016. Patient states she's doing a little bit better. He only notices moderate pain.  Objective: Skin incisions well coapted sutures intact. There is a significant amount of scar tissue adhesion noted underlying the incision site.  Assessment: Status post excision of plantar fibroma left foot. Date of surgery 12/06/2016  Plan of care: Today the patient was evaluated. Injection of 1 mL Celestone Soluspan injected intralesionally to break up scar tissue adhesions Prescription for tramadol Return to clinic in 2 months

## 2017-02-01 ENCOUNTER — Other Ambulatory Visit: Payer: Self-pay | Admitting: Family

## 2017-02-01 DIAGNOSIS — IMO0001 Reserved for inherently not codable concepts without codable children: Secondary | ICD-10-CM

## 2017-02-01 DIAGNOSIS — I10 Essential (primary) hypertension: Secondary | ICD-10-CM

## 2017-02-01 DIAGNOSIS — E1165 Type 2 diabetes mellitus with hyperglycemia: Secondary | ICD-10-CM

## 2017-02-16 ENCOUNTER — Other Ambulatory Visit: Payer: Self-pay | Admitting: Internal Medicine

## 2017-03-02 ENCOUNTER — Other Ambulatory Visit: Payer: Self-pay | Admitting: Family

## 2017-03-07 ENCOUNTER — Encounter: Payer: Self-pay | Admitting: Family

## 2017-03-08 ENCOUNTER — Ambulatory Visit: Payer: 59 | Admitting: Podiatry

## 2017-03-08 MED ORDER — DAPAGLIFLOZIN PROPANEDIOL 5 MG PO TABS: 5.0000 mg | ORAL_TABLET | Freq: Every day | ORAL | 2 refills | Status: DC

## 2017-03-10 ENCOUNTER — Other Ambulatory Visit: Payer: Self-pay | Admitting: Family

## 2017-03-10 DIAGNOSIS — F341 Dysthymic disorder: Secondary | ICD-10-CM

## 2017-03-22 ENCOUNTER — Other Ambulatory Visit: Payer: Self-pay | Admitting: *Deleted

## 2017-03-22 ENCOUNTER — Other Ambulatory Visit: Payer: 59

## 2017-03-22 MED ORDER — ROSUVASTATIN CALCIUM 5 MG PO TABS: 5.0000 mg | ORAL_TABLET | Freq: Every day | ORAL | 0 refills | Status: DC

## 2017-03-29 MED ORDER — MELOXICAM 15 MG PO TABS: 15.0000 mg | ORAL_TABLET | Freq: Every day | ORAL | 0 refills | Status: DC

## 2017-04-03 ENCOUNTER — Other Ambulatory Visit: Payer: Self-pay

## 2017-04-03 MED ORDER — DAPAGLIFLOZIN PROPANEDIOL 5 MG PO TABS: 5.0000 mg | ORAL_TABLET | Freq: Every day | ORAL | 0 refills | Status: DC

## 2017-04-05 ENCOUNTER — Other Ambulatory Visit: Payer: 59 | Admitting: *Deleted

## 2017-04-05 DIAGNOSIS — E785 Hyperlipidemia, unspecified: Secondary | ICD-10-CM

## 2017-04-05 LAB — LIPID PANEL
CHOLESTEROL TOTAL: 133 mg/dL (ref 100–199)
Chol/HDL Ratio: 6 ratio — ABNORMAL HIGH (ref 0.0–5.0)
HDL: 22 mg/dL — AB (ref >39)
LDL CALC: 36 mg/dL (ref 0–99)
Triglycerides: 376 mg/dL — ABNORMAL HIGH (ref 0–149)
VLDL CHOLESTEROL CAL: 75 mg/dL — AB (ref 5–40)

## 2017-04-05 LAB — HEPATIC FUNCTION PANEL
ALBUMIN: 4.1 g/dL (ref 3.5–5.5)
ALT: 28 IU/L (ref 0–44)
AST: 23 IU/L (ref 0–40)
Alkaline Phosphatase: 104 IU/L (ref 39–117)
Bilirubin Total: <0.2 mg/dL (ref 0.0–1.2)
Bilirubin, Direct: 0.09 mg/dL (ref 0.00–0.40)
TOTAL PROTEIN: 6.4 g/dL (ref 6.0–8.5)

## 2017-04-07 ENCOUNTER — Other Ambulatory Visit: Payer: Self-pay | Admitting: Internal Medicine

## 2017-04-08 ENCOUNTER — Telehealth: Payer: Self-pay

## 2017-04-08 MED ORDER — ROSUVASTATIN CALCIUM 5 MG PO TABS: 5.0000 mg | ORAL_TABLET | Freq: Every day | ORAL | 0 refills | Status: DC

## 2017-04-08 NOTE — Telephone Encounter (Signed)
Patient refill sent to pharmacy  °

## 2017-04-18 ENCOUNTER — Encounter: Payer: Self-pay | Admitting: Physician Assistant

## 2017-04-18 NOTE — Progress Notes (Signed)
Cardiology Office Note    Date:  04/22/2017   ID:  Kevin Miller, DOB 1961-06-18, MRN [id number]  PCP:  Golden Circle, FNP  Cardiologist:  Dr. Harrington Challenger  Chief Complaint: yearly follow up for hypertryglceridemia  History of Present Illness:   Kevin Miller is a 56 y.o. male with hx HTN, HLD, DM, pancreatitis, fatty liver here today for follow up.  Seen by Dr. Harrington Challenger 03/09/16 for initial consultation for hypertriglyceridemia. Added Tricor and fish oil with improved result. Followed up in Lipid clinic 05/2016.   Here today for follow up. He run out of medications 2 weeks ago. He is occasionally eats food high in cholesterol and salt. He eats low sodium diet. States Crestor causes constipation. The patient denies nausea, vomiting, fever, chest pain, palpitations, shortness of breath, orthopnea, PND, dizziness, syncope, cough, congestion, abdominal pain, hematochezia, melena, lower extremity edema. No regular exercise.  08/31/2016: VLDL NOT CALC 04/05/2017: Cholesterol, Total 133; HDL 22; LDL Calculated 36; Triglycerides 376      Past Medical History:  Diagnosis Date  . Clostridium difficile infection 2010  . Diabetes mellitus   . Elevated LFTs 2011  . Fatty liver   . Hyperlipidemia   . Hypertension   . Hypogonadism male 2012  . Leg fracture, left    metal rod  . Neck pain    Left  . Osteopenia 2011   back pain  . Pancreatic pseudocyst    Resolved  . Pancreatitis 2010   ? etiol  . Vitamin B 12 deficiency     Past Surgical History:  Procedure Laterality Date  . CHOLECYSTECTOMY    . ELBOW SURGERY     right  . HERNIA REPAIR    . LEG SURGERY     Left fracture  . TIBIA FRACTURE SURGERY     left    Current Medications:  Prior to Admission medications   Medication Sig Start Date End Date Taking? Authorizing Provider  acetaminophen (TYLENOL) 500 MG tablet Take 1,000 mg by mouth every 6 (six) hours as needed for mild pain.    Yes [provider]    amLODipine (NORVASC) 5 MG tablet TAKE 1 TABLET (5 MG TOTAL) BY MOUTH DAILY. 02/01/17  Yes Golden Circle, FNP  b complex vitamins tablet Take 1 tablet by mouth daily.   Yes [provider]  buPROPion (WELLBUTRIN SR) 150 MG 12 hr tablet TAKE 1 TABLET (150 MG TOTAL) BY MOUTH EVERY MORNING. 03/11/17  Yes Golden Circle, FNP  Cholecalciferol 1000 UNITS tablet Take 1,000 Units by mouth daily.     Yes [provider]  dapagliflozin propanediol (FARXIGA) 5 MG TABS tablet Take 5 mg by mouth daily. 04/03/17  Yes Golden Circle, FNP  fenofibrate (TRICOR) 145 MG tablet Take 1 tablet (145 mg total) by mouth daily. *Please call and schedule a one year follow up appointment* 02/18/17  Yes Fay Records, MD  glipiZIDE (GLUCOTROL) 5 MG tablet TAKE 1 TABLET (5 MG TOTAL) BY MOUTH DAILY BEFORE BREAKFAST. 11/29/16  Yes Golden Circle, FNP  glucose blood (ONE TOUCH ULTRA TEST) test strip USE ONCE DAILY AS NEEDED 09/16/14  Yes Golden Circle, FNP  lisinopril (PRINIVIL,ZESTRIL) 20 MG tablet TAKE 1 TABLET (20 MG TOTAL) BY MOUTH DAILY. 02/01/17  Yes Golden Circle, FNP  metFORMIN (GLUCOPHAGE) 500 MG tablet TAKE 1 TABLET (500 MG TOTAL) BY MOUTH 3 (THREE) TIMES DAILY. 02/01/17  Yes Golden Circle, FNP  metoprolol (LOPRESSOR) 50 MG  tablet TAKE 1 TABLET (50 MG TOTAL) BY MOUTH 2 (TWO) TIMES DAILY. 02/10/16  Yes Golden Circle, FNP  naproxen (NAPROSYN) 500 MG tablet TAKE 1 TABLET (500 MG TOTAL) BY MOUTH 2 (TWO) TIMES DAILY AS NEEDED. 10/02/16  Yes Golden Circle, FNP  Omega-3 Fatty Acids (FISH OIL) 1000 MG CAPS Take 4 capsules (4,000 mg total) by mouth daily. 03/09/16  Yes Fay Records, MD  rosuvastatin (CRESTOR) 5 MG tablet Take 1 tablet (5 mg total) by mouth daily. APPT NEEDED FOR FURTHER REFILLS, CALL [phone] TO SCHEDULE, 1ST ATTEMPT 04/08/17 07/07/17 Yes Fay Records, MD  traMADol (ULTRAM) 50 MG tablet Take 1 tablet (50 mg total) by mouth every 8 (eight) hours as needed. 01/04/17  Yes  Edrick Kins, DPM  VITAMIN K PO Take 1 tablet by mouth daily.    Yes [provider]    Allergies:   Gemfibrozil   Social History   Social History  . Marital status: Single    Spouse name: N/A  . Number of children: 2  . Years of education: N/A   Occupational History  .  [employer]   Social History Main Topics  . Smoking status: Never Smoker  . Smokeless tobacco: Never Used  . Alcohol use 0.0 oz/week     Comment: extremely rare; less than 3 drinks annually  . Drug use: No  . Sexual activity: No   Other Topics Concern  . None   Social History Narrative   Regular Exercise- No   Daily caffeine use-3     Family History:  The patient's family history includes Alcohol abuse in his father; COPD in his mother; Colon polyps in his father; Peripheral vascular disease in his mother.   ROS:   Please see the history of present illness.    ROS All other systems reviewed and are negative.   PHYSICAL EXAM:   VS:  BP 126/78   Pulse 91   Ht 6\' 1"  (1.854 m)   Wt 270 lb (122.5 kg)   SpO2 96%   BMI 35.62 kg/m    GEN: Well nourished, well developed, in no acute distress  HEENT: normal  Neck: no JVD, carotid bruits, or masses Cardiac: RRR; no murmurs, rubs, or gallops,no edema  Respiratory:  clear to auscultation bilaterally, normal work of breathing GI: soft, nontender, nondistended, + BS MS: no deformity or atrophy  Skin: warm and dry, no rash Neuro:  Alert and Oriented x 3, Strength and sensation are intact Psych: euthymic mood, full affect  Wt Readings from Last 3 Encounters:  04/22/17 270 lb (122.5 kg)  12/07/16 242 lb (109.8 kg)  09/17/16 274 lb 12 oz (124.6 kg)      Studies/Labs Reviewed:   EKG:  EKG is not ordered today.    Recent Labs: 04/05/2017: ALT 28   Lipid Panel    Component Value Date/Time   CHOL 133 04/05/2017 0923   TRIG 376 (H) 04/05/2017 0923   HDL 22 (L) 04/05/2017 0923   CHOLHDL 6.0 (H) 04/05/2017 0923   CHOLHDL 8.5 (H)  08/31/2016 0739   VLDL NOT CALC 08/31/2016 0739   LDLCALC 36 04/05/2017 0923   LDLDIRECT 47.0 02/10/2016 0849    Additional studies/ records that were reviewed today include:   AS ABOVE   ASSESSMENT & PLAN:    1. HL - 08/31/2016: VLDL NOT CALC 04/05/2017: Cholesterol, Total 133; HDL 22; LDL Calculated 36; Triglycerides 376  - labs improved. Continue fish oil and Tricore.  Stop Crestor (due to constipation and cost). Will start Lipitor 40mg  qd.  - Discussed heart healthy diet and regular exercise in detail. Education given for more than 15 minutes.   2. HTN - Stable and well controlled on current regimen.   3. DM - Last A1c of 6.9 on 11/2016.     Medication Adjustments/Labs and Tests Ordered: Current medicines are reviewed at length with the patient today.  Concerns regarding medicines are outlined above.  Medication changes, Labs and Tests ordered today are listed in the Patient Instructions below. Patient Instructions  Medication Instructions:  STOP Rosuvastatin (Crestor)  START Atorvastatin (Lipitor) 40 mg once daily   Labwork: Your physician recommends that you return for lab work in: 3 months  You will need to FAST for this appointment - nothing to eat or drink after midnight the night before except water.   Testing/Procedures: None Ordered   Follow-Up: Your physician recommends that you schedule a follow-up appointment in: [facility] in 3 months a few days after your fasting lab work   Your physician wants you to follow-up in: 1 year with Dr. Harrington Challenger.  You will receive a reminder letter in the mail two months in advance. If you don't receive a letter, please call our office to schedule the follow-up appointment.   If you need a refill on your cardiac medications before your next appointment, please call your pharmacy.   Thank you for choosing [facility]! Christen Bame, RN [phone]       Signed, Leanor Kail, PA  04/22/2017 2:47 PM      [facility] [place], [place], [place]  [address] Phone: [phone]; Fax: [phone]

## 2017-04-22 ENCOUNTER — Encounter: Payer: Self-pay | Admitting: Physician Assistant

## 2017-04-22 ENCOUNTER — Ambulatory Visit (INDEPENDENT_AMBULATORY_CARE_PROVIDER_SITE_OTHER): Payer: 59 | Admitting: Physician Assistant

## 2017-04-22 VITALS — BP 126/78 | HR 91 | Ht 73.0 in | Wt 270.0 lb

## 2017-04-22 DIAGNOSIS — I10 Essential (primary) hypertension: Secondary | ICD-10-CM | POA: Diagnosis not present

## 2017-04-22 DIAGNOSIS — E782 Mixed hyperlipidemia: Secondary | ICD-10-CM | POA: Diagnosis not present

## 2017-04-22 MED ORDER — FENOFIBRATE 145 MG PO TABS: 145.0000 mg | ORAL_TABLET | Freq: Every day | ORAL | 3 refills | Status: DC

## 2017-04-22 MED ORDER — ATORVASTATIN CALCIUM 40 MG PO TABS: 40.0000 mg | ORAL_TABLET | Freq: Every day | ORAL | 3 refills | Status: DC

## 2017-04-22 NOTE — Patient Instructions (Signed)
Medication Instructions:  STOP Rosuvastatin (Crestor)  START Atorvastatin (Lipitor) 40 mg once daily   Labwork: Your physician recommends that you return for lab work in: 3 months  You will need to FAST for this appointment - nothing to eat or drink after midnight the night before except water.   Testing/Procedures: None Ordered   Follow-Up: Your physician recommends that you schedule a follow-up appointment in: [facility] in 3 months a few days after your fasting lab work   Your physician wants you to follow-up in: 1 year with Dr. Harrington Challenger.  You will receive a reminder letter in the mail two months in advance. If you don't receive a letter, please call our office to schedule the follow-up appointment.   If you need a refill on your cardiac medications before your next appointment, please call your pharmacy.   Thank you for choosing [facility]! Christen Bame, RN [phone]

## 2017-04-30 ENCOUNTER — Other Ambulatory Visit: Payer: Self-pay | Admitting: Family

## 2017-04-30 DIAGNOSIS — I10 Essential (primary) hypertension: Secondary | ICD-10-CM

## 2017-05-31 ENCOUNTER — Other Ambulatory Visit: Payer: Self-pay | Admitting: Family

## 2017-07-02 ENCOUNTER — Other Ambulatory Visit: Payer: Self-pay | Admitting: Family

## 2017-07-07 ENCOUNTER — Encounter: Payer: Self-pay | Admitting: Family

## 2017-07-08 ENCOUNTER — Other Ambulatory Visit: Payer: Self-pay

## 2017-07-08 MED ORDER — DAPAGLIFLOZIN PROPANEDIOL 5 MG PO TABS: 5.0000 mg | ORAL_TABLET | Freq: Every day | ORAL | 0 refills | Status: DC

## 2017-07-26 ENCOUNTER — Other Ambulatory Visit: Payer: 59 | Admitting: *Deleted

## 2017-07-26 DIAGNOSIS — E782 Mixed hyperlipidemia: Secondary | ICD-10-CM

## 2017-07-26 LAB — COMPREHENSIVE METABOLIC PANEL
ALBUMIN: 4.3 g/dL (ref 3.5–5.5)
ALK PHOS: 96 IU/L (ref 39–117)
ALT: 30 IU/L (ref 0–44)
AST: 26 IU/L (ref 0–40)
Albumin/Globulin Ratio: 1.7 (ref 1.2–2.2)
BUN / CREAT RATIO: 16 (ref 9–20)
BUN: 18 mg/dL (ref 6–24)
Bilirubin Total: 0.4 mg/dL (ref 0.0–1.2)
CALCIUM: 9.2 mg/dL (ref 8.7–10.2)
CO2: 22 mmol/L (ref 20–29)
CREATININE: 1.11 mg/dL (ref 0.76–1.27)
Chloride: 100 mmol/L (ref 96–106)
GFR calc Af Amer: 87 mL/min/{1.73_m2} (ref >59)
GFR, EST NON AFRICAN AMERICAN: 75 mL/min/{1.73_m2} (ref >59)
GLUCOSE: 130 mg/dL — AB (ref 65–99)
Globulin, Total: 2.5 g/dL (ref 1.5–4.5)
Potassium: 4.3 mmol/L (ref 3.5–5.2)
Sodium: 138 mmol/L (ref 134–144)
Total Protein: 6.8 g/dL (ref 6.0–8.5)

## 2017-07-26 LAB — LIPID PANEL
Chol/HDL Ratio: 5.4 ratio — ABNORMAL HIGH (ref 0.0–5.0)
Cholesterol, Total: 124 mg/dL (ref 100–199)
HDL: 23 mg/dL — ABNORMAL LOW (ref >39)
LDL Calculated: 41 mg/dL (ref 0–99)
Triglycerides: 298 mg/dL — ABNORMAL HIGH (ref 0–149)
VLDL CHOLESTEROL CAL: 60 mg/dL — AB (ref 5–40)

## 2017-07-26 NOTE — Addendum Note (Signed)
Addended by: Eulis Foster on: 07/26/2017 07:57 AM   Modules accepted: Orders

## 2017-07-26 NOTE — Progress Notes (Signed)
b

## 2017-07-27 ENCOUNTER — Other Ambulatory Visit: Payer: Self-pay | Admitting: Family

## 2017-07-27 DIAGNOSIS — E1165 Type 2 diabetes mellitus with hyperglycemia: Secondary | ICD-10-CM

## 2017-07-27 DIAGNOSIS — IMO0001 Reserved for inherently not codable concepts without codable children: Secondary | ICD-10-CM

## 2017-07-27 DIAGNOSIS — I10 Essential (primary) hypertension: Secondary | ICD-10-CM

## 2017-07-27 LAB — HEMOGLOBIN A1C
Est. average glucose Bld gHb Est-mCnc: 163 mg/dL
Hgb A1c MFr Bld: 7.3 % — ABNORMAL HIGH (ref 4.8–5.6)

## 2017-08-01 ENCOUNTER — Ambulatory Visit (INDEPENDENT_AMBULATORY_CARE_PROVIDER_SITE_OTHER): Payer: 59 | Admitting: Pharmacist

## 2017-08-01 ENCOUNTER — Encounter: Payer: Self-pay | Admitting: Pharmacist

## 2017-08-01 DIAGNOSIS — E785 Hyperlipidemia, unspecified: Secondary | ICD-10-CM

## 2017-08-01 MED ORDER — OMEGA-3-ACID ETHYL ESTERS 1 G PO CAPS: 2.0000 g | ORAL_CAPSULE | Freq: Two times a day (BID) | ORAL | 3 refills | Status: DC

## 2017-08-01 NOTE — Progress Notes (Signed)
Patient ID: Kevin Miller                 DOB: [birth year]                    MRN: [id number]     HPI: Kevin Miller is a 56 y.o. male patient of Dr. Harrington Challenger with [facility] that presents today for hypertriglyceridemia. He was first seen by Dr. Harrington Challenger in March.  His TG at that visit were 871 mg/dL.  He has a history of pancreatitis in 2010.  His PMH is significant for DM, HTN, and HLD.  No CAD.  At that visit, he mentioned there was some confusion with his diabetic medications so he had stopped taking his glipizide.   He restarted this as well as Jardiance in March 2017.  A1c improved >1 pt in 3 months with addition of these medications.  He was also placed on Tricor 145mg  and fish oil 4 gm in addition to his pravastatin.  Since this time his pravastatin was changed to atorvastatin 40mg  daily.  Pt presents for f/u today in good spirits. Reports that he is doing fine on his current cholesterol medications. He is not totally sure if he is taking pravastatin or atorvastatin at home. States that he has been trying to make appropriate diet changes. He very rarely drinks alcohol. Eats out about 4x a week but tries to get salads with his meals instead of fries. Trying to eat more vegetables and fruits. Does not eat a lot of sweets. Drinks about 40 oz of diet soda daily.   Current Medications: atorvastatin 40mg , Tricor 145mg , fish oil 4gm  Intolerances: gemfibrozil (increased LFTs), Crestor (constipation and cost)  Risk Factors: DM, HTN LDL goal: <70 mg/dL, non-HDL < 100mg /dL, TG goal 150mg /dL  Diet: Drinks diet soda, 40 oz a day. Does not have sweets or candy usually. Does have fries. He eats out about 4 days a week. No regular alcohol consumption.   Exercise: Works at [employer], walks at work. Does house work.   Labs: 07/26/17 - TC 124, TG 298, HDL 23, LDL 41, A1c 7.3 - atorvastatin 40mg , fenofibrate 145mg , Fish oil 4g daily  08/31/16- TC 179, TG 576, HDL 21, LDL not calculated (pravastatin 40mg ,  Tricor 145mg ) 05/25/16- TC 160, TG 245, HDL 27, LDL 84, A1c 7.1 (pravastatin 20, Tricor 145mg , fish oil 4 gm) 02/10/16- TC 187, TG 871, HDL 28, LDL no calculated (pravastatin 20mg )   Past Medical History:  Diagnosis Date  . Clostridium difficile infection 2010  . Diabetes mellitus   . Elevated LFTs 2011  . Fatty liver   . Hyperlipidemia   . Hypertension   . Hypogonadism male 2012  . Leg fracture, left    metal rod  . Neck pain    Left  . Osteopenia 2011   back pain  . Pancreatic pseudocyst    Resolved  . Pancreatitis 2010   ? etiol  . Vitamin B 12 deficiency     Current Outpatient Prescriptions on File Prior to Visit  Medication Sig Dispense Refill  . acetaminophen (TYLENOL) 500 MG tablet Take 1,000 mg by mouth every 6 (six) hours as needed for mild pain.     Marland Kitchen amLODipine (NORVASC) 5 MG tablet TAKE 1 TABLET (5 MG TOTAL) BY MOUTH DAILY. 90 tablet 3  . atorvastatin (LIPITOR) 40 MG tablet Take 1 tablet (40 mg total) by mouth daily. 90 tablet 3  . b complex vitamins tablet Take  1 tablet by mouth daily.    Marland Kitchen buPROPion (WELLBUTRIN SR) 150 MG 12 hr tablet TAKE 1 TABLET (150 MG TOTAL) BY MOUTH EVERY MORNING. 90 tablet 1  . Cholecalciferol 1000 UNITS tablet Take 1,000 Units by mouth daily.      . dapagliflozin propanediol (FARXIGA) 5 MG TABS tablet Take 5 mg by mouth daily. Overdue for appt due must see provider for future refills 30 tablet 0  . fenofibrate (TRICOR) 145 MG tablet Take 1 tablet (145 mg total) by mouth daily. 90 tablet 3  . glipiZIDE (GLUCOTROL) 5 MG tablet Take 1 tablet (5 mg total) by mouth daily before breakfast. Must have office visit before refills given 90 tablet 0  . glucose blood (ONE TOUCH ULTRA TEST) test strip USE ONCE DAILY AS NEEDED 50 each 3  . lisinopril (PRINIVIL,ZESTRIL) 20 MG tablet TAKE 1 TABLET (20 MG TOTAL) BY MOUTH DAILY. 90 tablet 1  . metFORMIN (GLUCOPHAGE) 500 MG tablet TAKE 1 TABLET (500 MG TOTAL) BY MOUTH 3 (THREE) TIMES DAILY. 270 tablet 1  .  metoprolol tartrate (LOPRESSOR) 50 MG tablet TAKE 1 TABLET (50 MG TOTAL) BY MOUTH 2 (TWO) TIMES DAILY. 180 tablet 1  . naproxen (NAPROSYN) 500 MG tablet TAKE 1 TABLET (500 MG TOTAL) BY MOUTH 2 (TWO) TIMES DAILY AS NEEDED. 180 tablet 1  . Omega-3 Fatty Acids (FISH OIL) 1000 MG CAPS Take 4 capsules (4,000 mg total) by mouth daily.  0  . traMADol (ULTRAM) 50 MG tablet Take 1 tablet (50 mg total) by mouth every 8 (eight) hours as needed. 30 tablet 0  . VITAMIN K PO Take 1 tablet by mouth daily.      No current facility-administered medications on file prior to visit.     Allergies  Allergen Reactions  . Gemfibrozil     REACTION: abn LFTs    Assessment/Plan: Hyperlipidemia: Pt's LDL is at his goal of < 70 mg/dL. TG are elevated at 298. Reinforced the importance of maintaining a healthy diet in order to lower TG. Discussed continuing to limit carbohydrates, sugar, and alcohol consumption. Switch pt's fish oil to Lovaza 4 g daily. Continue other antihyperlipidemics as prescribed. Plan to obtain lipid panel on 10/15/17. F/u in clinic for lipid discussion on 10/25/17.   -Barkley Boards, PharmD Student   Thank you,  Lelan Pons. Patterson Hammersmith, [facility]  08/01/2017 8:43 AM

## 2017-08-01 NOTE — Patient Instructions (Addendum)
It was good seeing you today.  Your LDL cholesterol is at your goal of <70 mg/dL. However, your triglycerides are elevated at 298 mg/dL.  Start Lovaza 1g take 2 capsules by mouth twice daily. The prescription has been sent in.  If it is cost prohibitive, try to look for Nordic Naturals or Isaiah Blakes brand fish oil over the counter.   Continue the rest of your cholesterol medications as prescribed.  Try to limit the amount of carbohydrates and sugar in your diet and exercise when you can.  Complete lab work on 10/18/17. Follow up in clinic for evaluation on 10/25/17 at 9:30 AM.

## 2017-08-02 ENCOUNTER — Ambulatory Visit: Payer: 59

## 2017-08-07 ENCOUNTER — Encounter: Payer: Self-pay | Admitting: Family

## 2017-08-07 ENCOUNTER — Telehealth: Payer: Self-pay | Admitting: Family

## 2017-08-07 ENCOUNTER — Ambulatory Visit (INDEPENDENT_AMBULATORY_CARE_PROVIDER_SITE_OTHER): Payer: 59 | Admitting: Family

## 2017-08-07 VITALS — BP 126/80 | HR 72 | Temp 97.8°F | Ht 73.0 in | Wt 272.0 lb

## 2017-08-07 DIAGNOSIS — R5383 Other fatigue: Secondary | ICD-10-CM | POA: Insufficient documentation

## 2017-08-07 DIAGNOSIS — IMO0001 Reserved for inherently not codable concepts without codable children: Secondary | ICD-10-CM

## 2017-08-07 DIAGNOSIS — Z23 Encounter for immunization: Secondary | ICD-10-CM | POA: Diagnosis not present

## 2017-08-07 DIAGNOSIS — E1165 Type 2 diabetes mellitus with hyperglycemia: Secondary | ICD-10-CM | POA: Diagnosis not present

## 2017-08-07 MED ORDER — DAPAGLIFLOZIN PROPANEDIOL 10 MG PO TABS: 10.0000 mg | ORAL_TABLET | Freq: Every day | ORAL | 1 refills | Status: DC

## 2017-08-07 NOTE — Assessment & Plan Note (Signed)
Continues to experience fatigue with previously noted lower testosterone levels. Obtain testosterone, B12, and Lyme disease antibody. Follow-up and additional treatment pending blood work results. Cannot rule out underlying sleep apnea or cardiovascular disease.. Does not appear to be depression related.

## 2017-08-07 NOTE — Patient Instructions (Signed)
Thank you for choosing [facility].  SUMMARY AND INSTRUCTIONS:  Please increase the Farxiga to 10 mg daily.  Continue to monitor blood sugars at home.   We will recheck your A1c in 3 months.   Please stop by the lab between 7:30-10am when completing blood work.  We will check your testosterone, B12, and Lyme disease antibodies.    Medication:  Your prescription(s) have been submitted to your pharmacy or been printed and provided for you. Please take as directed and contact our office if you believe you are having problem(s) with the medication(s) or have any questions.  Labs:  Please stop by the lab on the lower level of the building for your blood work. Your results will be released to [place] (or called to you) after review, usually within 72 hours after test completion. If any changes need to be made, you will be notified at that same time.  1.) The lab is open from 7:30am to 5:30 pm Monday-Friday 2.) No appointment is necessary 3.) Fasting (if needed) is 6-8 hours after food and drink; black coffee and water are okay   Follow up:  If your symptoms worsen or fail to improve, please contact our office for further instruction, or in case of emergency go directly to the emergency room at the closest medical facility.

## 2017-08-07 NOTE — Telephone Encounter (Signed)
Pt would like to know if you would be willing to take him back on as a pt since greg is leaving?

## 2017-08-07 NOTE — Assessment & Plan Note (Signed)
Type 2 diabetes with slightly worsened blood sugar controls with A1c of 7.3. Continue current dosage of glipizide and metformin. Increase Farxiga. Encouraged to monitor blood sugars at home. Diabetic foot exam completed today. Encouraged complete diabetic eye exam independently. Maintained on lisinopril for CAD risk reduction. Continues to work with cardiology and lipid clinic to reduce cholesterol. Follow-up in 3 months or sooner if needed.

## 2017-08-07 NOTE — Progress Notes (Signed)
Subjective:    Patient ID: Kevin Miller, male    DOB: [birth year], 56 y.o.   MRN: [id number]  Chief Complaint  Patient presents with  . Follow-up    medication refills:farxiga consult/ had A1C 07/26/2017--flu shot?    HPI:  Kevin Miller is a 56 y.o. male who  has a past medical history of Clostridium difficile infection (2010); Diabetes mellitus; Elevated LFTs (2011); Fatty liver; Hyperlipidemia; Hypertension; Hypogonadism male (2012); Leg fracture, left; Neck pain; Osteopenia (2011); Pancreatic pseudocyst; Pancreatitis (2010); and Vitamin B 12 deficiency. and presents today for a follow up office visit.   Type 2 diabetes - Currently prescribed metformin and Farxiga. Reports taking the medication as prescribed and denies adverse side effects or hypoglycemic readings. Blood sugars at home range in the 120-160's . Denies new symptoms of end organ damage. No excessive hunger, thirst, or urination. Working on a low/carbohydrate modified oral intake. He does have some continued fatigue.   Lab Results  Component Value Date   HGBA1C 7.3 (H) 07/26/2017     Lab Results  Component Value Date   CREATININE 1.11 07/26/2017   BUN 18 07/26/2017   NA 138 07/26/2017   K 4.3 07/26/2017   CL 100 07/26/2017   CO2 22 07/26/2017       Allergies  Allergen Reactions  . Gemfibrozil     REACTION: abn LFTs      Outpatient Medications Prior to Visit  Medication Sig Dispense Refill  . acetaminophen (TYLENOL) 500 MG tablet Take 1,000 mg by mouth every 6 (six) hours as needed for mild pain.     Marland Kitchen amLODipine (NORVASC) 5 MG tablet TAKE 1 TABLET (5 MG TOTAL) BY MOUTH DAILY. 90 tablet 3  . b complex vitamins tablet Take 1 tablet by mouth daily.    . Cholecalciferol 1000 UNITS tablet Take 1,000 Units by mouth daily.      . fenofibrate (TRICOR) 145 MG tablet Take 1 tablet (145 mg total) by mouth daily. 90 tablet 3  . glipiZIDE (GLUCOTROL) 5 MG tablet Take 1 tablet (5 mg total) by mouth daily  before breakfast. Must have office visit before refills given 90 tablet 0  . glucose blood (ONE TOUCH ULTRA TEST) test strip USE ONCE DAILY AS NEEDED 50 each 3  . lisinopril (PRINIVIL,ZESTRIL) 20 MG tablet TAKE 1 TABLET (20 MG TOTAL) BY MOUTH DAILY. 90 tablet 1  . metFORMIN (GLUCOPHAGE) 500 MG tablet TAKE 1 TABLET (500 MG TOTAL) BY MOUTH 3 (THREE) TIMES DAILY. 270 tablet 1  . metoprolol tartrate (LOPRESSOR) 50 MG tablet TAKE 1 TABLET (50 MG TOTAL) BY MOUTH 2 (TWO) TIMES DAILY. 180 tablet 1  . naproxen (NAPROSYN) 500 MG tablet TAKE 1 TABLET (500 MG TOTAL) BY MOUTH 2 (TWO) TIMES DAILY AS NEEDED. 180 tablet 1  . omega-3 acid ethyl esters (LOVAZA) 1 g capsule Take 2 capsules (2 g total) by mouth 2 (two) times daily. 360 capsule 3  . traMADol (ULTRAM) 50 MG tablet Take 1 tablet (50 mg total) by mouth every 8 (eight) hours as needed. 30 tablet 0  . VITAMIN K PO Take 1 tablet by mouth daily.     . dapagliflozin propanediol (FARXIGA) 5 MG TABS tablet Take 5 mg by mouth daily. Overdue for appt due must see provider for future refills 30 tablet 0  . atorvastatin (LIPITOR) 40 MG tablet Take 1 tablet (40 mg total) by mouth daily. 90 tablet 3  . buPROPion (WELLBUTRIN SR) 150 MG 12 hr tablet  TAKE 1 TABLET (150 MG TOTAL) BY MOUTH EVERY MORNING. (Patient not taking: Reported on 08/07/2017) 90 tablet 1   No facility-administered medications prior to visit.      Past Medical History:  Diagnosis Date  . Clostridium difficile infection 2010  . Diabetes mellitus   . Elevated LFTs 2011  . Fatty liver   . Hyperlipidemia   . Hypertension   . Hypogonadism male 2012  . Leg fracture, left    metal rod  . Neck pain    Left  . Osteopenia 2011   back pain  . Pancreatic pseudocyst    Resolved  . Pancreatitis 2010   ? etiol  . Vitamin B 12 deficiency       Review of Systems  Eyes:       Negative for changes in vision.  Respiratory: Negative for chest tightness and shortness of breath.   Cardiovascular:  Negative for chest pain, palpitations and leg swelling.  Endocrine: Negative for polydipsia, polyphagia and polyuria.  Neurological: Negative for dizziness, weakness, light-headedness and headaches.      Objective:    BP 126/80   Pulse 72   Temp 97.8 F (36.6 C)   Ht 6\' 1"  (1.854 m)   Wt 272 lb (123.4 kg)   SpO2 98%   BMI 35.89 kg/m  Nursing note and vital signs reviewed.  Physical Exam  Constitutional: He is oriented to person, place, and time. He appears well-developed and well-nourished. No distress.  Cardiovascular: Normal rate, regular rhythm, normal heart sounds and intact distal pulses.   Pulmonary/Chest: Effort normal and breath sounds normal.  Neurological: He is alert and oriented to person, place, and time.  Diabetic Foot Exam - Simple   Simple Foot Form Diabetic Foot exam was performed with the following findings:  Yes  08/07/2017 12:40 PM  Visual Inspection No deformities, no ulcerations, no other skin breakdown bilaterally:  Yes Sensation Testing Intact to touch and monofilament testing bilaterally:  Yes Pulse Check Posterior Tibialis and Dorsalis pulse intact bilaterally:  Yes Comments    Skin: Skin is warm and dry.  Psychiatric: He has a normal mood and affect. His behavior is normal. Judgment and thought content normal.       Assessment & Plan:   Problem List Items Addressed This Visit      Endocrine   Type II diabetes mellitus, uncontrolled ([place]) - Primary    Type 2 diabetes with slightly worsened blood sugar controls with A1c of 7.3. Continue current dosage of glipizide and metformin. Increase Farxiga. Encouraged to monitor blood sugars at home. Diabetic foot exam completed today. Encouraged complete diabetic eye exam independently. Maintained on lisinopril for CAD risk reduction. Continues to work with cardiology and lipid clinic to reduce cholesterol. Follow-up in 3 months or sooner if needed.      Relevant Medications   dapagliflozin propanediol  (FARXIGA) 10 MG TABS tablet     Other   Fatigue    Continues to experience fatigue with previously noted lower testosterone levels. Obtain testosterone, B12, and Lyme disease antibody. Follow-up and additional treatment pending blood work results. Cannot rule out underlying sleep apnea or cardiovascular disease.. Does not appear to be depression related.      Relevant Orders   Testosterone   B. Burgdorfi Antibodies   B12    Other Visit Diagnoses    Need for influenza vaccination       Relevant Orders   Flu Vaccine QUAD 36+ mos IM (Completed)  I have discontinued Kevin Miller's dapagliflozin propanediol. I am also having him start on dapagliflozin propanediol. Additionally, I am having him maintain his acetaminophen, Cholecalciferol, glucose blood, VITAMIN K PO, b complex vitamins, naproxen, traMADol, amLODipine, buPROPion, atorvastatin, fenofibrate, metoprolol tartrate, glipiZIDE, lisinopril, metFORMIN, and omega-3 acid ethyl esters.   Meds ordered this encounter  Medications  . dapagliflozin propanediol (FARXIGA) 10 MG TABS tablet    Sig: Take 10 mg by mouth daily.    Dispense:  90 tablet    Refill:  1    Order Specific Question:   Supervising Provider    Answer:   Pricilla Holm A [[id number]]     Follow-up: Return in about 3 months (around 11/07/2017), or if symptoms worsen or fail to improve.  Mauricio Po, FNP

## 2017-08-26 ENCOUNTER — Other Ambulatory Visit: Payer: Self-pay | Admitting: Family

## 2017-09-04 ENCOUNTER — Encounter: Payer: Self-pay | Admitting: Family

## 2017-09-04 ENCOUNTER — Other Ambulatory Visit (INDEPENDENT_AMBULATORY_CARE_PROVIDER_SITE_OTHER): Payer: 59

## 2017-09-04 DIAGNOSIS — R5383 Other fatigue: Secondary | ICD-10-CM

## 2017-09-04 DIAGNOSIS — E785 Hyperlipidemia, unspecified: Secondary | ICD-10-CM | POA: Diagnosis not present

## 2017-09-04 LAB — LIPID PANEL
Cholesterol: 174 mg/dL (ref 0–200)
HDL: 20.1 mg/dL — ABNORMAL LOW
Total CHOL/HDL Ratio: 9
Triglycerides: 1424 mg/dL — ABNORMAL HIGH (ref 0.0–149.0)

## 2017-09-04 LAB — LDL CHOLESTEROL, DIRECT: Direct LDL: 48 mg/dL

## 2017-09-04 LAB — TESTOSTERONE: TESTOSTERONE: 127.51 ng/dL — AB (ref 300.00–890.00)

## 2017-09-04 LAB — VITAMIN B12: Vitamin B-12: 181 pg/mL — ABNORMAL LOW (ref 211–911)

## 2017-09-05 LAB — B. BURGDORFI ANTIBODIES

## 2017-10-11 ENCOUNTER — Ambulatory Visit (INDEPENDENT_AMBULATORY_CARE_PROVIDER_SITE_OTHER): Payer: 59 | Admitting: Internal Medicine

## 2017-10-11 ENCOUNTER — Other Ambulatory Visit (INDEPENDENT_AMBULATORY_CARE_PROVIDER_SITE_OTHER): Payer: 59

## 2017-10-11 ENCOUNTER — Encounter: Payer: Self-pay | Admitting: Internal Medicine

## 2017-10-11 VITALS — BP 136/88 | HR 89 | Temp 98.3°F | Ht 73.0 in | Wt 256.0 lb

## 2017-10-11 DIAGNOSIS — E11649 Type 2 diabetes mellitus with hypoglycemia without coma: Secondary | ICD-10-CM | POA: Diagnosis not present

## 2017-10-11 DIAGNOSIS — Z23 Encounter for immunization: Secondary | ICD-10-CM

## 2017-10-11 DIAGNOSIS — Z114 Encounter for screening for human immunodeficiency virus [HIV]: Secondary | ICD-10-CM | POA: Diagnosis not present

## 2017-10-11 DIAGNOSIS — Z0000 Encounter for general adult medical examination without abnormal findings: Secondary | ICD-10-CM

## 2017-10-11 DIAGNOSIS — M25511 Pain in right shoulder: Secondary | ICD-10-CM | POA: Diagnosis not present

## 2017-10-11 DIAGNOSIS — E785 Hyperlipidemia, unspecified: Secondary | ICD-10-CM | POA: Diagnosis not present

## 2017-10-11 DIAGNOSIS — I10 Essential (primary) hypertension: Secondary | ICD-10-CM | POA: Diagnosis not present

## 2017-10-11 DIAGNOSIS — Z0001 Encounter for general adult medical examination with abnormal findings: Secondary | ICD-10-CM | POA: Insufficient documentation

## 2017-10-11 LAB — CBC WITH DIFFERENTIAL/PLATELET
BASOS ABS: 0.1 10*3/uL (ref 0.0–0.1)
BASOS PCT: 1.3 % (ref 0.0–3.0)
EOS ABS: 0.3 10*3/uL (ref 0.0–0.7)
Eosinophils Relative: 3.9 % (ref 0.0–5.0)
HEMATOCRIT: 46.9 % (ref 39.0–52.0)
Hemoglobin: 15.8 g/dL (ref 13.0–17.0)
LYMPHS ABS: 1.8 10*3/uL (ref 0.7–4.0)
LYMPHS PCT: 26.3 % (ref 12.0–46.0)
MCHC: 33.6 g/dL (ref 30.0–36.0)
MCV: 85 fl (ref 78.0–100.0)
MONO ABS: 0.7 10*3/uL (ref 0.1–1.0)
Monocytes Relative: 9.8 % (ref 3.0–12.0)
NEUTROS ABS: 4 10*3/uL (ref 1.4–7.7)
NEUTROS PCT: 58.7 % (ref 43.0–77.0)
PLATELETS: 260 10*3/uL (ref 150.0–400.0)
RBC: 5.52 Mil/uL (ref 4.22–5.81)
RDW: 13.7 % (ref 11.5–15.5)
WBC: 6.7 10*3/uL (ref 4.0–10.5)

## 2017-10-11 LAB — BASIC METABOLIC PANEL
BUN: 18 mg/dL (ref 6–23)
CHLORIDE: 105 meq/L (ref 96–112)
CO2: 26 meq/L (ref 19–32)
Calcium: 9.4 mg/dL (ref 8.4–10.5)
Creatinine, Ser: 0.88 mg/dL (ref 0.40–1.50)
GFR: 95.55 mL/min (ref >60.00)
Glucose, Bld: 66 mg/dL — ABNORMAL LOW (ref 70–99)
POTASSIUM: 4.2 meq/L (ref 3.5–5.1)
SODIUM: 139 meq/L (ref 135–145)

## 2017-10-11 LAB — HEMOGLOBIN A1C: HEMOGLOBIN A1C: 6.4 % (ref 4.6–6.5)

## 2017-10-11 LAB — HEPATIC FUNCTION PANEL
ALK PHOS: 83 U/L (ref 39–117)
ALT: 26 U/L (ref 0–53)
AST: 21 U/L (ref 0–37)
Albumin: 4.5 g/dL (ref 3.5–5.2)
BILIRUBIN DIRECT: 0.1 mg/dL (ref 0.0–0.3)
BILIRUBIN TOTAL: 0.5 mg/dL (ref 0.2–1.2)
TOTAL PROTEIN: 7.2 g/dL (ref 6.0–8.3)

## 2017-10-11 LAB — URINALYSIS, ROUTINE W REFLEX MICROSCOPIC
Hgb urine dipstick: NEGATIVE
Leukocytes, UA: NEGATIVE
Nitrite: NEGATIVE
PH: 5.5 (ref 5.0–8.0)
RBC / HPF: NONE SEEN (ref 0–?)
SPECIFIC GRAVITY, URINE: 1.025 (ref 1.000–1.030)
TOTAL PROTEIN, URINE-UPE24: NEGATIVE
UROBILINOGEN UA: 0.2 (ref 0.0–1.0)
Urine Glucose: >=1000 — AB

## 2017-10-11 LAB — PSA: PSA: 2.57 ng/mL (ref 0.10–4.00)

## 2017-10-11 LAB — MICROALBUMIN / CREATININE URINE RATIO
Creatinine,U: 100.8 mg/dL
Microalb Creat Ratio: 0.7 mg/g (ref 0.0–30.0)

## 2017-10-11 LAB — TSH: TSH: 1 u[IU]/mL (ref 0.35–4.50)

## 2017-10-11 NOTE — Patient Instructions (Addendum)
Please remember to get your yearly Eye exam  You had the Prevnar 13 pneumonia shot today  Please continue all other medications as before, and refills have been done if requested.  Please have the pharmacy call with any other refills you may need.  Please continue your efforts at being more active, low cholesterol diet, and weight control.  You are otherwise up to date with prevention measures today.  Please keep your appointments with your specialists as you may have planned - the Lipid clinic  You will be contacted regarding the referral for: Sports medicine (though you can make an appt as you leave today)  Please go to the LAB in the Basement (turn left off the elevator) for the tests to be done today  You will be contacted by phone if any changes need to be made immediately.  Otherwise, you will receive a letter about your results with an explanation, but please check with MyChart first.  Please remember to sign up for MyChart if you have not done so, as this will be important to you in the future with finding out test results, communicating by private email, and scheduling acute appointments online when needed.  Please return in 6 months, or sooner if needed, with Lab testing done 3-5 days before

## 2017-10-11 NOTE — Progress Notes (Signed)
Subjective:    Patient ID: Kevin Miller, male    DOB: 1961/06/02, 56 y.o.   MRN: [id number]  HPI  Here for wellness and f/u;  Overall doing ok;  Pt denies Chest pain, worsening SOB, DOE, wheezing, orthopnea, PND, worsening LE edema, palpitations, dizziness or syncope.  Pt denies neurological change such as new headache, facial or extremity weakness.  Pt denies polydipsia, polyuria, or low sugar symptoms. Pt states overall good compliance with treatment and medications, good tolerability, and has been trying to follow appropriate diet.  Pt denies worsening depressive symptoms, suicidal ideation or panic. No fever, night sweats, wt loss, loss of appetite, or other constitutional symptoms.  Pt states good ability with ADL's, has low fall risk, home safety reviewed and adequate, no other significant changes in hearing or vision, and only occasionally active with exercise.  Was able to lose 14 lbs with lower carb diet. But is concerned now more about the cholesterol.  Does admit he has had more fat and protein in doing this.  Has hx of marked elev TG over 1400 after fatty meal last visit.   Wt Readings from Last 3 Encounters:  10/11/17 256 lb (116.1 kg)  08/07/17 272 lb (123.4 kg)  04/22/17 270 lb (122.5 kg)  Did have some mild plantar fasciitis re cently now resolved.  Takes the OTC B12 supplements daily.   Did have a fall up the steps goint fast and injuryed right shoulder 4 mo ago, just not resolved; pain to shoulder worse to try to raise ovehead and cannot really reach around to the right back pocket of the pain  Did also have low testosterone sept 2018 but he does not want specific tx due to fearful of prostate ca.  No other interval hx Past Medical History:  Diagnosis Date  . Clostridium difficile infection 2010  . Diabetes mellitus   . Elevated LFTs 2011  . Fatty liver   . Hyperlipidemia   . Hypertension   . Hypogonadism male 2012  . Leg fracture, left    metal rod  . Neck pain    Left  . Osteopenia 2011   back pain  . Pancreatic pseudocyst    Resolved  . Pancreatitis 2010   ? etiol  . Vitamin B 12 deficiency    Past Surgical History:  Procedure Laterality Date  . CHOLECYSTECTOMY    . ELBOW SURGERY     right  . HERNIA REPAIR    . LEG SURGERY     Left fracture  . TIBIA FRACTURE SURGERY     left    reports that  has never smoked. he has never used smokeless tobacco. He reports that he drinks alcohol. He reports that he does not use drugs. family history includes Alcohol abuse in his father; COPD in his mother; Colon polyps in his father; Peripheral vascular disease in his mother. Allergies  Allergen Reactions  . Gemfibrozil     REACTION: abn LFTs   Current Outpatient Medications on File Prior to Visit  Medication Sig Dispense Refill  . acetaminophen (TYLENOL) 500 MG tablet Take 1,000 mg by mouth every 6 (six) hours as needed for mild pain.     Marland Kitchen amLODipine (NORVASC) 5 MG tablet TAKE 1 TABLET (5 MG TOTAL) BY MOUTH DAILY. 90 tablet 3  . b complex vitamins tablet Take 1 tablet by mouth daily.    Marland Kitchen buPROPion (WELLBUTRIN SR) 150 MG 12 hr tablet TAKE 1 TABLET (150 MG TOTAL) BY MOUTH EVERY  MORNING. 90 tablet 1  . Cholecalciferol 1000 UNITS tablet Take 1,000 Units by mouth daily.      . dapagliflozin propanediol (FARXIGA) 10 MG TABS tablet Take 10 mg by mouth daily. 90 tablet 1  . fenofibrate (TRICOR) 145 MG tablet Take 1 tablet (145 mg total) by mouth daily. 90 tablet 3  . glipiZIDE (GLUCOTROL) 5 MG tablet TAKE 1 TABLET (5 MG TOTAL) BY MOUTH DAILY BEFORE BREAKFAST. MUST HAVE OFFICE VISIT FOR MORE REFILLS 90 tablet 0  . glucose blood (ONE TOUCH ULTRA TEST) test strip USE ONCE DAILY AS NEEDED 50 each 3  . lisinopril (PRINIVIL,ZESTRIL) 20 MG tablet TAKE 1 TABLET (20 MG TOTAL) BY MOUTH DAILY. 90 tablet 1  . metFORMIN (GLUCOPHAGE) 500 MG tablet TAKE 1 TABLET (500 MG TOTAL) BY MOUTH 3 (THREE) TIMES DAILY. 270 tablet 1  . metoprolol tartrate (LOPRESSOR) 50 MG tablet  TAKE 1 TABLET (50 MG TOTAL) BY MOUTH 2 (TWO) TIMES DAILY. 180 tablet 1  . naproxen (NAPROSYN) 500 MG tablet TAKE 1 TABLET (500 MG TOTAL) BY MOUTH 2 (TWO) TIMES DAILY AS NEEDED. 180 tablet 1  . omega-3 acid ethyl esters (LOVAZA) 1 g capsule Take 2 capsules (2 g total) by mouth 2 (two) times daily. 360 capsule 3  . traMADol (ULTRAM) 50 MG tablet Take 1 tablet (50 mg total) by mouth every 8 (eight) hours as needed. 30 tablet 0  . VITAMIN K PO Take 1 tablet by mouth daily.     Marland Kitchen atorvastatin (LIPITOR) 40 MG tablet Take 1 tablet (40 mg total) by mouth daily. 90 tablet 3   No current facility-administered medications on file prior to visit.    Review of Systems Constitutional: Negative for other unusual diaphoresis, sweats, appetite or weight changes HENT: Negative for other worsening hearing loss, ear pain, facial swelling, mouth sores or neck stiffness.   Eyes: Negative for other worsening pain, redness or other visual disturbance.  Respiratory: Negative for other stridor or swelling Cardiovascular: Negative for other palpitations or other chest pain  Gastrointestinal: Negative for worsening diarrhea or loose stools, blood in stool, distention or other pain Genitourinary: Negative for hematuria, flank pain or other change in urine volume.  Musculoskeletal: Negative for myalgias or other joint swelling.  Skin: Negative for other color change, or other wound or worsening drainage.  Neurological: Negative for other syncope or numbness. Hematological: Negative for other adenopathy or swelling Psychiatric/Behavioral: Negative for hallucinations, other worsening agitation, SI, self-injury, or new decreased concentration All other system neg per pt    Objective:   Physical Exam BP 136/88   Pulse 89   Temp 98.3 F (36.8 C) (Oral)   Ht 6\' 1"  (1.854 m)   Wt 256 lb (116.1 kg)   SpO2 99%   BMI 33.78 kg/m  VS noted,  Constitutional: Pt is oriented to person, place, and time. Appears well-developed  and well-nourished, in no significant distress and comfortable Head: Normocephalic and atraumatic  Eyes: Conjunctivae and EOM are normal. Pupils are equal, round, and reactive to light Right Ear: External ear normal without discharge Left Ear: External ear normal without discharge Nose: Nose without discharge or deformity Mouth/Throat: Oropharynx is without other ulcerations and moist  Neck: Normal range of motion. Neck supple. No JVD present. No tracheal deviation present or significant neck LA or mass Cardiovascular: Normal rate, regular rhythm, normal heart sounds and intact distal pulses.   Pulmonary/Chest: WOB normal and breath sounds without rales or wheezing  Abdominal: Soft. Bowel sounds are normal.  NT. No HSM  Musculoskeletal: Normal range of motion except reduced ROM left shoulder, NT,  Exhibits no edema Lymphadenopathy: Has no other cervical adenopathy.  Neurological: Pt is alert and oriented to person, place, and time. Pt has normal reflexes. No cranial nerve deficit. Motor grossly intact, Gait intact Skin: Skin is warm and dry. No rash noted or new ulcerations Psychiatric:  Has normal mood and affect. Behavior is normal without agitation No other exam findings  Lab Results  Component Value Date   WBC 6.2 11/10/2012   HGB 14.4 11/10/2012   HCT 42.9 11/10/2012   PLT 245.0 11/10/2012   GLUCOSE 130 (H) 07/26/2017   CHOL 174 09/04/2017   TRIG (H) 09/04/2017    1424.0 Triglyceride is over 400; calculations on Lipids are invalid.   HDL 20.10 (L) 09/04/2017   LDLDIRECT 48.0 09/04/2017   LDLCALC 41 07/26/2017   ALT 30 07/26/2017   AST 26 07/26/2017   NA 138 07/26/2017   K 4.3 07/26/2017   CL 100 07/26/2017   CREATININE 1.11 07/26/2017   BUN 18 07/26/2017   CO2 22 07/26/2017   TSH 1.17 11/10/2012   PSA 1.58 11/10/2012   HGBA1C 7.3 (H) 07/26/2017       Assessment & Plan:

## 2017-10-12 LAB — HIV ANTIBODY (ROUTINE TESTING W REFLEX): HIV 1&2 Ab, 4th Generation: NONREACTIVE

## 2017-10-13 ENCOUNTER — Encounter: Payer: Self-pay | Admitting: Internal Medicine

## 2017-10-13 NOTE — Assessment & Plan Note (Signed)

## 2017-10-13 NOTE — Assessment & Plan Note (Signed)
stable overall by history and exam, recent data reviewed with pt, and pt to continue medical treatment as before,  to f/u any worsening symptoms or concerns BP Readings from Last 3 Encounters:  10/11/17 136/88  08/07/17 126/80  04/22/17 126/78

## 2017-10-13 NOTE — Assessment & Plan Note (Signed)
stable overall by history and exam, recent data reviewed with pt, and pt to continue medical treatment as before,  to f/u any worsening symptoms or concerns Lab Results  Component Value Date   HGBA1C 6.4 10/11/2017

## 2017-10-13 NOTE — Assessment & Plan Note (Signed)
?   Rot cuff issue - for sport med referral

## 2017-10-13 NOTE — Assessment & Plan Note (Signed)
With marked elev TG and hx of pancreatitis, pt plans to f/u with lipid clinic

## 2017-10-23 ENCOUNTER — Other Ambulatory Visit: Payer: Self-pay

## 2017-10-23 ENCOUNTER — Ambulatory Visit (INDEPENDENT_AMBULATORY_CARE_PROVIDER_SITE_OTHER)
Admission: RE | Admit: 2017-10-23 | Discharge: 2017-10-23 | Disposition: A | Discharge status: Home / Self Care | Payer: 59 | Source: Ambulatory Visit | Attending: Family Medicine | Admitting: Family Medicine

## 2017-10-23 ENCOUNTER — Ambulatory Visit: Payer: 59 | Admitting: Family Medicine

## 2017-10-23 ENCOUNTER — Encounter: Payer: Self-pay | Admitting: Family Medicine

## 2017-10-23 ENCOUNTER — Ambulatory Visit: Payer: Self-pay

## 2017-10-23 VITALS — BP 130/84 | HR 88 | Ht 73.0 in | Wt 256.0 lb

## 2017-10-23 DIAGNOSIS — M25519 Pain in unspecified shoulder: Secondary | ICD-10-CM

## 2017-10-23 DIAGNOSIS — M7581 Other shoulder lesions, right shoulder: Secondary | ICD-10-CM

## 2017-10-23 DIAGNOSIS — M25511 Pain in right shoulder: Secondary | ICD-10-CM | POA: Diagnosis not present

## 2017-10-23 MED ORDER — DICLOFENAC SODIUM 2 % TD SOLN: 2.0000 g | Freq: Two times a day (BID) | TRANSDERMAL | 3 refills | Status: DC

## 2017-10-23 NOTE — Patient Instructions (Signed)
Good to see you.  Ice 20 minutes 2 times daily. Usually after activity and before bed. Exercises 3 times a week.  pennsaid pinkie amount topically 2 times daily as needed.  Keep hands within peripheral vision  Continue the vitamin D but stop the K2 after 4 weeks.  Injected the shoulder today as well  Xray downstairs as well  See me again in 4 weeks

## 2017-10-23 NOTE — Progress Notes (Signed)
[name] [place],  [address] Phone: [phone] Subjective:    I'm seeing this patient by the request  of:   Biagio Borg, MD Thank you.for the consult   CC: Right shoulder pain.  [id number]  Kevin Miller is a 56 y.o. male coming in with complaint of.  Right shoulder pain.  Patient states that there was a fall.  Catch himself.  Since then has had intermittent pain that is now constant pain.  Describes it as a dull, throbbing aching sensation.  Seems to stay fairly localized to the anterior and posterior aspect of the shoulder with no radiation down the arm.  Can wake him up at night.  Rates the severity of pain with certain movements is 9 out of 10.  Patient denies of any weakness.  Responds very moderately to over-the-counter medications.     Past Medical History:  Diagnosis Date  . Clostridium difficile infection 2010  . Diabetes mellitus   . Elevated LFTs 2011  . Fatty liver   . Hyperlipidemia   . Hypertension   . Hypogonadism male 2012  . Leg fracture, left    metal rod  . Neck pain    Left  . Osteopenia 2011   back pain  . Pancreatic pseudocyst    Resolved  . Pancreatitis 2010   ? etiol  . Vitamin B 12 deficiency    Past Surgical History:  Procedure Laterality Date  . CHOLECYSTECTOMY    . ELBOW SURGERY     right  . HERNIA REPAIR    . LEG SURGERY     Left fracture  . TIBIA FRACTURE SURGERY     left   Social History   Socioeconomic History  . Marital status: Single    Spouse name: Not on file  . Number of children: 2  . Years of education: Not on file  . Highest education level: Not on file  Social Needs  . Financial resource strain: Not on file  . Food insecurity - worry: Not on file  . Food insecurity - inability: Not on file  . Transportation needs - medical: Not on file  . Transportation needs - non-medical: Not on file  Occupational History    Employer: [employer]  Tobacco Use  .  Smoking status: Never Smoker  . Smokeless tobacco: Never Used  Substance and Sexual Activity  . Alcohol use: Yes    Alcohol/week: 0.0 oz    Comment: extremely rare; less than 3 drinks annually  . Drug use: No  . Sexual activity: No  Other Topics Concern  . Not on file  Social History Narrative   Regular Exercise- No   Daily caffeine use-3   Allergies  Allergen Reactions  . Gemfibrozil     REACTION: abn LFTs   Family History  Problem Relation Age of Onset  . COPD Mother   . Peripheral vascular disease Mother   . Alcohol abuse Father   . Colon polyps Father   . Colon cancer Neg Hx      Past medical history, social, surgical and family history all reviewed in electronic medical record.  No pertanent information unless stated regarding to the chief complaint.   Review of Systems:Review of systems updated and as accurate as of 10/23/17  No headache, visual changes, nausea, vomiting, diarrhea, constipation, dizziness, abdominal pain, skin rash, fevers, chills, night sweats, weight loss, swollen lymph nodes, body aches, joint swelling, muscle aches, chest pain,  shortness of breath, mood changes.   Objective  There were no vitals taken for this visit. Systems examined below as of 10/23/17   General: No apparent distress alert and oriented x3 mood and affect normal, dressed appropriately.  HEENT: Pupils equal, extraocular movements intact  Respiratory: Patient's speak in full sentences and does not appear short of breath  Cardiovascular: No lower extremity edema, non tender, no erythema  Skin: Warm dry intact with no signs of infection or rash on extremities or on axial skeleton.  Abdomen: Soft nontender  Neuro: Cranial nerves II through XII are intact, neurovascularly intact in all extremities with 2+ DTRs and 2+ pulses.  Lymph: No lymphadenopathy of posterior or anterior cervical chain or axillae bilaterally.  Gait normal with good balance and coordination.  MSK:  Non tender  with full range of motion and good stability and symmetric strength and tone of  elbows, wrist, hip, knee and ankles bilaterally.  Shoulder: Right Inspection reveals no abnormalities, atrophy or asymmetry. Palpation is normal with no tenderness over AC joint or bicipital groove. ROM is full in all planes passively. Rotator cuff strength normal throughout. signs of impingement with positive Neer and Hawkin's tests, but negative empty can sign. Speeds and Yergason's tests normal. No labral pathology noted with negative Obrien's, negative clunk and good stability. Normal scapular function observed. No painful arc and no drop arm sign. No apprehension sign  MSK US performed of: Right This study was ordered, performed, and interpreted by Charlann Boxer D.O.  Shoulder:   Supraspinatus:  Appears normal on long and transverse views does have some increasing hypoechoic changes, Bursal bulge seen with shoulder abduction on impingement view. Infraspinatus:  Appears normal on long and transverse views. Significant increase in Doppler flow Subscapularis:  Appears normal on long and transverse views. Positive bursa Teres Minor:  Appears normal on long and transverse views. AC joint: Moderate to severe arthritis Glenohumeral Joint:  Appears normal without effusion. Glenoid Labrum: Mild calcific changes of the posterior labrum Biceps Tendon:  Appears normal on long and transverse views, no fraying of tendon, tendon located in intertubercular groove, no subluxation with shoulder internal or external rotation.  Impression: Subacromial bursitis, rotator cuff tendinopathy with acromial clavicular arthritis  Procedure: Real-time Ultrasound Guided Injection of right glenohumeral joint Device: GE Logiq E  Ultrasound guided injection is preferred based studies that show increased duration, increased effect, greater accuracy, decreased procedural pain, increased response rate with ultrasound guided versus blind  injection.  Verbal informed consent obtained.  Time-out conducted.  Noted no overlying erythema, induration, or other signs of local infection.  Skin prepped in a sterile fashion.  Local anesthesia: Topical Ethyl chloride.  With sterile technique and under real time ultrasound guidance:  Joint visualized.  23g 1  inch needle inserted posterior approach. Pictures taken for needle placement. Patient did have injection of 2 cc of 1% lidocaine, 2 cc of 0.5% Marcaine, and 1.0 cc of Kenalog 40 mg/dL. Completed without difficulty  Pain immediately resolved suggesting accurate placement of the medication.  Advised to call if fevers/chills, erythema, induration, drainage, or persistent bleeding.  Images permanently stored and available for review in the ultrasound unit.  Impression: Technically successful ultrasound guided injection.  Procedure  73532; 15 additional minutes spent for Therapeutic exercises as stated in above notes.  This included exercises focusing on stretching, strengthening, with significant focus on eccentric aspects.   Long term goals include an improvement in range of motion, strength, endurance as well as avoiding reinjury. Patient's  frequency would include in 1-2 times a day, 3-5 times a week for a duration of 6-12 weeks. Shoulder Exercises that included:  Basic scapular stabilization to include adduction and depression of scapula Scaption, focusing on proper movement and good control Internal and External rotation utilizing a theraband, with elbow tucked at side entire time Rows with theraband   Proper technique shown and discussed handout in great detail with ATC.  All questions were discussed and answered.     Impression and Recommendations:     This case required medical decision making of moderate complexity.      Note: This dictation was prepared with Dragon dictation along with smaller phrase technology. Any transcriptional errors that result from this process  are unintentional.

## 2017-10-23 NOTE — Progress Notes (Signed)
[name] [place], [place] [address] Phone: [phone] Subjective:    I'm seeing this patient by the request  of:    CC:   [id number]  Kevin Miller is a 56 y.o. male coming in with complaint of right shoulder pain. Patient explains he fell going up the steps ([place]).   Onset- 4 months  Location- anterior and posterior Duration- Worse at night Character-"tooth ache" Aggravating factors- Sleeping, throwing a frisby (posterior pain)  Reliving factors- heating pad  Therapies tried-  Severity-     Past Medical History:  Diagnosis Date  . Clostridium difficile infection 2010  . Diabetes mellitus   . Elevated LFTs 2011  . Fatty liver   . Hyperlipidemia   . Hypertension   . Hypogonadism male 2012  . Leg fracture, left    metal rod  . Neck pain    Left  . Osteopenia 2011   back pain  . Pancreatic pseudocyst    Resolved  . Pancreatitis 2010   ? etiol  . Vitamin B 12 deficiency    Past Surgical History:  Procedure Laterality Date  . CHOLECYSTECTOMY    . ELBOW SURGERY     right  . HERNIA REPAIR    . LEG SURGERY     Left fracture  . TIBIA FRACTURE SURGERY     left   Social History   Socioeconomic History  . Marital status: Single    Spouse name: Not on file  . Number of children: 2  . Years of education: Not on file  . Highest education level: Not on file  Social Needs  . Financial resource strain: Not on file  . Food insecurity - worry: Not on file  . Food insecurity - inability: Not on file  . Transportation needs - medical: Not on file  . Transportation needs - non-medical: Not on file  Occupational History    Employer: [employer]  Tobacco Use  . Smoking status: Never Smoker  . Smokeless tobacco: Never Used  Substance and Sexual Activity  . Alcohol use: Yes    Alcohol/week: 0.0 oz    Comment: extremely rare; less than 3 drinks annually  . Drug use: No  . Sexual activity: No  Other Topics  Concern  . Not on file  Social History Narrative   Regular Exercise- No   Daily caffeine use-3   Allergies  Allergen Reactions  . Gemfibrozil     REACTION: abn LFTs   Family History  Problem Relation Age of Onset  . COPD Mother   . Peripheral vascular disease Mother   . Alcohol abuse Father   . Colon polyps Father   . Colon cancer Neg Hx      Past medical history, social, surgical and family history all reviewed in electronic medical record.  No pertanent information unless stated regarding to the chief complaint.   Review of Systems:Review of systems updated and as accurate as of 10/23/17  No headache, visual changes, nausea, vomiting, diarrhea, constipation, dizziness, abdominal pain, skin rash, fevers, chills, night sweats, weight loss, swollen lymph nodes, body aches, joint swelling, muscle aches, chest pain, shortness of breath, mood changes.   Objective  There were no vitals taken for this visit. Systems examined below as of 10/23/17   General: No apparent distress alert and oriented x3 mood and affect normal, dressed appropriately.  HEENT: Pupils equal, extraocular movements intact  Respiratory: Patient's speak in full sentences and does not  appear short of breath  Cardiovascular: No lower extremity edema, non tender, no erythema  Skin: Warm dry intact with no signs of infection or rash on extremities or on axial skeleton.  Abdomen: Soft nontender  Neuro: Cranial nerves II through XII are intact, neurovascularly intact in all extremities with 2+ DTRs and 2+ pulses.  Lymph: No lymphadenopathy of posterior or anterior cervical chain or axillae bilaterally.  Gait normal with good balance and coordination.  MSK:  Non tender with full range of motion and good stability and symmetric strength and tone of shoulders, elbows, wrist, hip, knee and ankles bilaterally.     Impression and Recommendations:     This case required medical decision making of moderate  complexity.      Note: This dictation was prepared with Dragon dictation along with smaller phrase technology. Any transcriptional errors that result from this process are unintentional.

## 2017-10-23 NOTE — Assessment & Plan Note (Signed)
Patient given injection today and tolerated the procedure well.  We discussed icing regimen.  Work with Product/process development scientist to learn home exercises in greater detail.  X-rays pending secondary to the amount of pain patient is having.  Differential also includes patient's acromioclavicular arthritis as well as potentially a deep labral tear.  We will monitor closely and patient will come back again in 4 weeks.

## 2017-10-24 ENCOUNTER — Other Ambulatory Visit: Payer: 59 | Admitting: *Deleted

## 2017-10-24 ENCOUNTER — Other Ambulatory Visit: Payer: Self-pay | Admitting: Pharmacist

## 2017-10-24 DIAGNOSIS — E782 Mixed hyperlipidemia: Secondary | ICD-10-CM | POA: Diagnosis not present

## 2017-10-24 LAB — LIPID PANEL
Chol/HDL Ratio: 4.6 ratio (ref 0.0–5.0)
Cholesterol, Total: 144 mg/dL (ref 100–199)
HDL: 31 mg/dL — ABNORMAL LOW (ref >39)
LDL Calculated: 76 mg/dL (ref 0–99)
Triglycerides: 183 mg/dL — ABNORMAL HIGH (ref 0–149)
VLDL Cholesterol Cal: 37 mg/dL (ref 5–40)

## 2017-10-24 NOTE — Progress Notes (Signed)
Patient ID: MARICO BUCKLE                 DOB: 09/18/1961                    MRN: [id number]     HPI: Kevin Miller is a 56 y.o. male patient of Dr. Harrington Challenger with [facility] that presents today for hypertriglyceridemia. He was first seen by Dr. Harrington Challenger in March. His TG at that visit were 871 mg/dL. He has a history of pancreatitis in 2010 (new onset DM at the same time likely exacerbated pancreatitis). He had his gallbladder removed as a result since this was thought to possibly be contributing to pancreatitis. His PMH is significant for DM, HTN, and HLD, no CAD. At that visit, he mentioned there was some confusion with his diabetic medications so he had stopped taking his glipizide.He restarted this as well as Jardiance in March 2017. A1c improved >1 pt in 3 months with addition of these medications. He was also placed on Tricor 145mg  and fish oil 4 gm in addition to his pravastatin. Since this time his pravastatin was changed to atorvastatin 40mg  daily. At most recent follow up in lipid clinic, fish oil was changed to Lovaza 4g daily and healthy lifestyle was encouraged. Pt presents today for follow up after lipids were checked yesterday.  Pt reports feeling well overall. He recently started on a keto diet 1 month ago and has completely cut out carbohydrates and sugars from his diet. His A1c has improved to 6.3 and his TG have improved from 298 to 183. He does not drink any alcohol. He has been working on portion control and primarily eats 2 meals per day. He snacks on nuts. He has been losing weight as well - home weight this AM was 247 lbs. He has lost 20 lbs in the past 6 months. He reports adherence to all lipid medications and denies adverse effects.  Current Medications: atorvastatin 40mg , Tricor 145mg , fish oil 4g daily Intolerances: gemfibrozil (increased LFTs), Crestor (constipation and cost)  Risk Factors: DM, HTN LDL goal: <100 mg/dL, non-HDL < 130mg /dL, TG goal 150mg /dL  Diet:  Drinks diet soda, 40 oz a day. Does not have sweets or candy usually. Does have fries. He eats out about 4 days a week. No regular alcohol consumption.   Exercise: Works at [employer], walks at work. Does house work.   Labs: 10/24/17: TC 144, TG 183, HDL 31, LDL 76 (atorvastatin 40mg  daily, Tricor 145mg  daily, Lovaza 4g daily) 07/26/17 - TC 124, TG 298, HDL 23, LDL 41, A1c 7.3 (atorvastatin 40mg , fenofibrate 145mg , Fish oil 4g daily  08/31/16- TC 179, TG 576, HDL 21, LDL not calculated (pravastatin 40mg , Tricor 145mg ) 05/25/16- TC 160, TG 245, HDL 27, LDL 84, A1c 7.1 (pravastatin 20, Tricor 145mg , fish oil 4 gm) 02/10/16- TC 187, TG 871, HDL 28, LDL no calculated (pravastatin 20mg )  Past Medical History:  Diagnosis Date  . Clostridium difficile infection 2010  . Diabetes mellitus   . Elevated LFTs 2011  . Fatty liver   . Hyperlipidemia   . Hypertension   . Hypogonadism male 2012  . Leg fracture, left    metal rod  . Neck pain    Left  . Osteopenia 2011   back pain  . Pancreatic pseudocyst    Resolved  . Pancreatitis 2010   ? etiol  . Vitamin B 12 deficiency     Current Outpatient Medications on File  Prior to Visit  Medication Sig Dispense Refill  . acetaminophen (TYLENOL) 500 MG tablet Take 1,000 mg by mouth every 6 (six) hours as needed for mild pain.     Marland Kitchen amLODipine (NORVASC) 5 MG tablet TAKE 1 TABLET (5 MG TOTAL) BY MOUTH DAILY. 90 tablet 3  . atorvastatin (LIPITOR) 40 MG tablet Take 1 tablet (40 mg total) by mouth daily. 90 tablet 3  . b complex vitamins tablet Take 1 tablet by mouth daily.    Marland Kitchen buPROPion (WELLBUTRIN SR) 150 MG 12 hr tablet TAKE 1 TABLET (150 MG TOTAL) BY MOUTH EVERY MORNING. 90 tablet 1  . Cholecalciferol 1000 UNITS tablet Take 1,000 Units by mouth daily.      . dapagliflozin propanediol (FARXIGA) 10 MG TABS tablet Take 10 mg by mouth daily. 90 tablet 1  . Diclofenac Sodium (PENNSAID) 2 % SOLN Place 2 g 2 (two) times daily onto the skin. 112 g 3  .  fenofibrate (TRICOR) 145 MG tablet Take 1 tablet (145 mg total) by mouth daily. 90 tablet 3  . glipiZIDE (GLUCOTROL) 5 MG tablet TAKE 1 TABLET (5 MG TOTAL) BY MOUTH DAILY BEFORE BREAKFAST. MUST HAVE OFFICE VISIT FOR MORE REFILLS 90 tablet 0  . glucose blood (ONE TOUCH ULTRA TEST) test strip USE ONCE DAILY AS NEEDED 50 each 3  . lisinopril (PRINIVIL,ZESTRIL) 20 MG tablet TAKE 1 TABLET (20 MG TOTAL) BY MOUTH DAILY. 90 tablet 1  . metFORMIN (GLUCOPHAGE) 500 MG tablet TAKE 1 TABLET (500 MG TOTAL) BY MOUTH 3 (THREE) TIMES DAILY. 270 tablet 1  . metoprolol tartrate (LOPRESSOR) 50 MG tablet TAKE 1 TABLET (50 MG TOTAL) BY MOUTH 2 (TWO) TIMES DAILY. 180 tablet 1  . naproxen (NAPROSYN) 500 MG tablet TAKE 1 TABLET (500 MG TOTAL) BY MOUTH 2 (TWO) TIMES DAILY AS NEEDED. 180 tablet 1  . omega-3 acid ethyl esters (LOVAZA) 1 g capsule Take 2 capsules (2 g total) by mouth 2 (two) times daily. 360 capsule 3  . traMADol (ULTRAM) 50 MG tablet Take 1 tablet (50 mg total) by mouth every 8 (eight) hours as needed. 30 tablet 0  . VITAMIN K PO Take 1 tablet by mouth daily.      No current facility-administered medications on file prior to visit.     Allergies  Allergen Reactions  . Gemfibrozil     REACTION: abn LFTs    Assessment/Plan:  1. Hyperlipidemia/hypertriglyceridemia - TG have improved and are very close to goal <150 and LDL is at goal < 100 for primary prevention with risk factors. Will continue atorvastatin 40mg  daily, fenofibrate 145mg  daily, and fish oil 4g daily. Pt's recent keto diet has helped to improve TG as patient has cut out all carbs and sugar from his diet. Pt encouraged to continue with weight loss (20 lbs in the past 6 month so far) and healthy eating. F/u as needed in lipid clinic.   Ferol Laiche E. Cassell Voorhies, PharmD, CPP, [place] [address] [place], [place] [address] Phone: [phone]; Fax: [phone] 10/25/2017 12:21 PM

## 2017-10-25 ENCOUNTER — Ambulatory Visit (INDEPENDENT_AMBULATORY_CARE_PROVIDER_SITE_OTHER): Payer: 59 | Admitting: Pharmacist

## 2017-10-25 ENCOUNTER — Encounter: Payer: Self-pay | Admitting: Family Medicine

## 2017-10-25 ENCOUNTER — Other Ambulatory Visit: Payer: Self-pay

## 2017-10-25 VITALS — Wt 252.0 lb

## 2017-10-25 DIAGNOSIS — E782 Mixed hyperlipidemia: Secondary | ICD-10-CM

## 2017-10-25 DIAGNOSIS — I10 Essential (primary) hypertension: Secondary | ICD-10-CM

## 2017-10-25 DIAGNOSIS — E781 Pure hyperglyceridemia: Secondary | ICD-10-CM | POA: Diagnosis not present

## 2017-10-25 MED ORDER — METOPROLOL TARTRATE 50 MG PO TABS: ORAL_TABLET | ORAL | 3 refills | Status: DC

## 2017-11-20 ENCOUNTER — Ambulatory Visit: Payer: 59 | Admitting: Family Medicine

## 2017-11-23 ENCOUNTER — Other Ambulatory Visit: Payer: Self-pay | Admitting: Family

## 2017-11-25 ENCOUNTER — Other Ambulatory Visit: Payer: Self-pay | Admitting: Internal Medicine

## 2017-12-11 NOTE — Progress Notes (Signed)
[name] [name], [name]: [phone] Subjective:      CC: Right shoulder pain follow-up  [id number]  Kevin Miller is a 57 y.o. male coming in with complaint of shoulder pain.  Patient was seen previously and given injection for shoulder bursitis on October 23, 2017.  In addition to this patient given home exercises and icing regimen.  Patient states 95% better.  Still has some difficulty with certain range of motion.     Past Medical History:  Diagnosis Date  . Clostridium difficile infection 2010  . Diabetes mellitus   . Elevated LFTs 2011  . Fatty liver   . Hyperlipidemia   . Hypertension   . Hypogonadism male 2012  . Leg fracture, left    metal rod  . Neck pain    Left  . Osteopenia 2011   back pain  . Pancreatic pseudocyst    Resolved  . Pancreatitis 2010   ? etiol  . Vitamin B 12 deficiency    Past Surgical History:  Procedure Laterality Date  . CHOLECYSTECTOMY    . ELBOW SURGERY     right  . HERNIA REPAIR    . LEG SURGERY     Left fracture  . TIBIA FRACTURE SURGERY     left   Social History   Socioeconomic History  . Marital status: Single    Spouse name: Not on file  . Number of children: 2  . Years of education: Not on file  . Highest education level: Not on file  Social Needs  . Financial resource strain: Not on file  . Food insecurity - worry: Not on file  . Food insecurity - inability: Not on file  . Transportation needs - medical: Not on file  . Transportation needs - non-medical: Not on file  Occupational History    Employer: [employer]  Tobacco Use  . Smoking status: Never Smoker  . Smokeless tobacco: Never Used  Substance and Sexual Activity  . Alcohol use: Yes    Alcohol/week: 0.0 oz    Comment: extremely rare; less than 3 drinks annually  . Drug use: No  . Sexual activity: No  Other Topics Concern  . Not on file  Social History Narrative   Regular  Exercise- No   Daily caffeine use-3   Allergies  Allergen Reactions  . Gemfibrozil     REACTION: abn LFTs   Family History  Problem Relation Age of Onset  . COPD Mother   . Peripheral vascular disease Mother   . Alcohol abuse Father   . Colon polyps Father   . Colon cancer Neg Hx      Past medical history, social, surgical and family history all reviewed in electronic medical record.  No pertanent information unless stated regarding to the chief complaint.   Review of Systems:Review of systems updated and as accurate as of 12/11/17  No headache, visual changes, nausea, vomiting, diarrhea, constipation, dizziness, abdominal pain, skin rash, fevers, chills, night sweats, weight loss, swollen lymph nodes, body aches, joint swelling, muscle aches, chest pain, shortness of breath, mood changes.   Objective  There were no vitals taken for this visit. Systems examined below as of 12/11/17   General: No apparent distress alert and oriented x3 mood and affect normal, dressed appropriately.  HEENT: Pupils equal, extraocular movements intact  Respiratory: Patient's speak in full sentences and does not appear short of breath  Cardiovascular: No lower extremity  edema, non tender, no erythema  Skin: Warm dry intact with no signs of infection or rash on extremities or on axial skeleton.  Abdomen: Soft nontender  Neuro: Cranial nerves II through XII are intact, neurovascularly intact in all extremities with 2+ DTRs and 2+ pulses.  Lymph: No lymphadenopathy of posterior or anterior cervical chain or axillae bilaterally.  Gait normal with good balance and coordination.  MSK:  Non tender with full range of motion and good stability and symmetric strength and tone of  elbows, wrist, hip, knee and ankles bilaterally.   Shoulder: Right Inspection reveals no abnormalities, atrophy or asymmetry. Palpation is normal with no tenderness over AC joint or bicipital groove. ROM is full in all  planes. Rotator cuff strength normal throughout. Mild impingement Speeds and Yergason's tests normal. No labral pathology noted with negative Obrien's, negative clunk and good stability.  Positive crossover Normal scapular function observed. No painful arc and no drop arm sign. No apprehension sign Contralateral shoulder unremarkable  Procedure: Real-time Ultrasound Guided Injection of right acromioclavicular joint Device: GE Logiq Q7 Ultrasound guided injection is preferred based studies that show increased duration, increased effect, greater accuracy, decreased procedural pain, increased response rate, and decreased cost with ultrasound guided versus blind injection.  Verbal informed consent obtained.  Time-out conducted.  Noted no overlying erythema, induration, or other signs of local infection.  Skin prepped in a sterile fashion.  Local anesthesia: Topical Ethyl chloride.  With sterile technique and under real time ultrasound guidance: With a 25-gauge half inch needle patient was injected with a total of 0.5 cc of 0.5% Marcaine and 0.5 cc of Kenalog 40 mg/mL Completed without difficulty  Pain immediately resolved suggesting accurate placement of the medication.  Advised to call if fevers/chills, erythema, induration, drainage, or persistent bleeding.  Images permanently stored and available for review in the ultrasound unit.  Impression: Technically successful ultrasound guided injection.      Impression and Recommendations:     This case required medical decision making of moderate complexity.      Note: This dictation was prepared with Dragon dictation along with smaller phrase technology. Any transcriptional errors that result from this process are unintentional.

## 2017-12-12 ENCOUNTER — Ambulatory Visit: Payer: Self-pay

## 2017-12-12 ENCOUNTER — Ambulatory Visit: Payer: 59 | Admitting: Family Medicine

## 2017-12-12 ENCOUNTER — Encounter: Payer: Self-pay | Admitting: Family Medicine

## 2017-12-12 VITALS — BP 130/80 | HR 85 | Ht 73.0 in | Wt 251.0 lb

## 2017-12-12 DIAGNOSIS — G8929 Other chronic pain: Secondary | ICD-10-CM

## 2017-12-12 DIAGNOSIS — M25511 Pain in right shoulder: Principal | ICD-10-CM

## 2017-12-12 DIAGNOSIS — M19011 Primary osteoarthritis, right shoulder: Secondary | ICD-10-CM

## 2017-12-12 DIAGNOSIS — M7581 Other shoulder lesions, right shoulder: Secondary | ICD-10-CM | POA: Diagnosis not present

## 2017-12-12 NOTE — Assessment & Plan Note (Signed)
Improved after the injection.  Encourage patient to continue to work on the home exercises, movement icing regimen, worse.  Found to have acromioclavicular arthritis and was given an injection today.  Hopefully I will will alleviate the rest of the pain.  Follow-up again in 4 weeks

## 2017-12-12 NOTE — Patient Instructions (Signed)
Good to see yo u We injected the right [facility] joint today and I hope it knocks out the rest  Start the exercises again on Monday and do them 2-3 times a week for another month  Ice 20 minutes 2 times daily. Usually after activity and before bed. See me again in 4 weeks if not perfect  Happy New Year!

## 2017-12-12 NOTE — Assessment & Plan Note (Signed)
Patient given injection and tolerated the procedure well.  We discussed icing regimen and home exercises, we discussed which activities to do which wants to avoid.  Increase activity as tolerated.  Follow-up again in 4-6 weeks

## 2018-01-10 ENCOUNTER — Ambulatory Visit: Payer: 59 | Admitting: Family Medicine

## 2018-01-26 ENCOUNTER — Other Ambulatory Visit: Payer: Self-pay | Admitting: Family

## 2018-01-26 DIAGNOSIS — E1165 Type 2 diabetes mellitus with hyperglycemia: Principal | ICD-10-CM

## 2018-01-26 DIAGNOSIS — IMO0001 Reserved for inherently not codable concepts without codable children: Secondary | ICD-10-CM

## 2018-01-26 DIAGNOSIS — I10 Essential (primary) hypertension: Secondary | ICD-10-CM

## 2018-02-10 ENCOUNTER — Other Ambulatory Visit: Payer: Self-pay | Admitting: Family

## 2018-02-10 DIAGNOSIS — IMO0001 Reserved for inherently not codable concepts without codable children: Secondary | ICD-10-CM

## 2018-02-10 DIAGNOSIS — E1165 Type 2 diabetes mellitus with hyperglycemia: Principal | ICD-10-CM

## 2018-02-12 ENCOUNTER — Other Ambulatory Visit: Payer: Self-pay | Admitting: Internal Medicine

## 2018-02-12 DIAGNOSIS — E1165 Type 2 diabetes mellitus with hyperglycemia: Principal | ICD-10-CM

## 2018-02-12 DIAGNOSIS — IMO0001 Reserved for inherently not codable concepts without codable children: Secondary | ICD-10-CM

## 2018-02-13 ENCOUNTER — Other Ambulatory Visit: Payer: Self-pay | Admitting: Family

## 2018-02-13 DIAGNOSIS — I10 Essential (primary) hypertension: Secondary | ICD-10-CM

## 2018-02-27 ENCOUNTER — Other Ambulatory Visit: Payer: Self-pay | Admitting: Internal Medicine

## 2018-02-27 DIAGNOSIS — I10 Essential (primary) hypertension: Secondary | ICD-10-CM

## 2018-03-30 ENCOUNTER — Other Ambulatory Visit: Payer: Self-pay | Admitting: Family

## 2018-03-30 DIAGNOSIS — I10 Essential (primary) hypertension: Secondary | ICD-10-CM

## 2018-03-31 ENCOUNTER — Other Ambulatory Visit: Payer: Self-pay | Admitting: Internal Medicine

## 2018-03-31 DIAGNOSIS — I10 Essential (primary) hypertension: Secondary | ICD-10-CM

## 2018-04-11 ENCOUNTER — Ambulatory Visit: Payer: 59 | Admitting: Internal Medicine

## 2018-04-18 ENCOUNTER — Other Ambulatory Visit: Payer: Self-pay | Admitting: Physician Assistant

## 2018-05-13 ENCOUNTER — Other Ambulatory Visit (INDEPENDENT_AMBULATORY_CARE_PROVIDER_SITE_OTHER): Payer: 59

## 2018-05-13 ENCOUNTER — Telehealth: Payer: Self-pay

## 2018-05-13 DIAGNOSIS — E11649 Type 2 diabetes mellitus with hypoglycemia without coma: Secondary | ICD-10-CM

## 2018-05-13 LAB — LIPID PANEL
CHOL/HDL RATIO: 6
Cholesterol: 145 mg/dL (ref 0–200)
HDL: 23.1 mg/dL — AB (ref >39.00)
Triglycerides: 447 mg/dL — ABNORMAL HIGH (ref 0.0–149.0)

## 2018-05-13 LAB — BASIC METABOLIC PANEL
BUN: 24 mg/dL — AB (ref 6–23)
CHLORIDE: 105 meq/L (ref 96–112)
CO2: 25 mEq/L (ref 19–32)
Calcium: 8.9 mg/dL (ref 8.4–10.5)
Creatinine, Ser: 1.1 mg/dL (ref 0.40–1.50)
GFR: 73.7 mL/min (ref >60.00)
GLUCOSE: 93 mg/dL (ref 70–99)
POTASSIUM: 4 meq/L (ref 3.5–5.1)
Sodium: 139 mEq/L (ref 135–145)

## 2018-05-13 LAB — LDL CHOLESTEROL, DIRECT: Direct LDL: 67 mg/dL

## 2018-05-13 LAB — HEPATIC FUNCTION PANEL
ALT: 21 U/L (ref 0–53)
AST: 18 U/L (ref 0–37)
Albumin: 4.2 g/dL (ref 3.5–5.2)
Alkaline Phosphatase: 88 U/L (ref 39–117)
BILIRUBIN DIRECT: 0.1 mg/dL (ref 0.0–0.3)
BILIRUBIN TOTAL: 0.3 mg/dL (ref 0.2–1.2)
TOTAL PROTEIN: 6.8 g/dL (ref 6.0–8.3)

## 2018-05-13 LAB — HEMOGLOBIN A1C: HEMOGLOBIN A1C: 5.9 % (ref 4.6–6.5)

## 2018-05-13 NOTE — Telephone Encounter (Signed)
Lab needed to be reentered that expired per Lab

## 2018-05-16 ENCOUNTER — Encounter: Payer: Self-pay | Admitting: Internal Medicine

## 2018-05-16 ENCOUNTER — Ambulatory Visit: Payer: 59 | Admitting: Internal Medicine

## 2018-05-16 VITALS — BP 120/80 | HR 80 | Ht 73.0 in | Wt 249.0 lb

## 2018-05-16 DIAGNOSIS — E11649 Type 2 diabetes mellitus with hypoglycemia without coma: Secondary | ICD-10-CM

## 2018-05-16 DIAGNOSIS — Z0000 Encounter for general adult medical examination without abnormal findings: Secondary | ICD-10-CM | POA: Diagnosis not present

## 2018-05-16 DIAGNOSIS — I10 Essential (primary) hypertension: Secondary | ICD-10-CM | POA: Diagnosis not present

## 2018-05-16 DIAGNOSIS — E782 Mixed hyperlipidemia: Secondary | ICD-10-CM | POA: Diagnosis not present

## 2018-05-16 MED ORDER — GLIPIZIDE ER 2.5 MG PO TB24: 2.5000 mg | ORAL_TABLET | Freq: Every day | ORAL | 3 refills | Status: DC

## 2018-05-16 NOTE — Progress Notes (Signed)
Subjective:    Patient ID: Kevin Miller, male    DOB: [birth year], 57 y.o.   MRN: [id number]  HPI  Here to f/u; overall doing ok,  Pt denies chest pain, increasing sob or doe, wheezing, orthopnea, PND, increased LE swelling, palpitations, dizziness or syncope.  Pt denies new neurological symptoms such as new headache, or facial or extremity weakness or numbness.  Pt denies polydipsia, polyuria, or low sugar episode.  Pt states overall good compliance with meds, mostly trying to follow appropriate diet, with wt overall stable,  but little exercise however.  Lost several lbs with better diet, keto like, admits to more fat in diet. Wt Readings from Last 3 Encounters:  05/16/18 249 lb (112.9 kg)  12/12/17 251 lb (113.9 kg)  10/25/17 252 lb (114.3 kg)  Right shoulder improved, but still some pain, holding off on MRi for now.    Plans to make eye doctor appt soon.  No other interval hx or new complaint Past Medical History:  Diagnosis Date  . Clostridium difficile infection 2010  . Diabetes mellitus   . Elevated LFTs 2011  . Fatty liver   . Hyperlipidemia   . Hypertension   . Hypogonadism male 2012  . Leg fracture, left    metal rod  . Neck pain    Left  . Osteopenia 2011   back pain  . Pancreatic pseudocyst    Resolved  . Pancreatitis 2010   ? etiol  . Vitamin B 12 deficiency    Past Surgical History:  Procedure Laterality Date  . CHOLECYSTECTOMY    . ELBOW SURGERY     right  . HERNIA REPAIR    . LEG SURGERY     Left fracture  . TIBIA FRACTURE SURGERY     left    reports that he has never smoked. He has never used smokeless tobacco. He reports that he drinks alcohol. He reports that he does not use drugs. family history includes Alcohol abuse in his father; COPD in his mother; Colon polyps in his father; Peripheral vascular disease in his mother. Allergies  Allergen Reactions  . Gemfibrozil     REACTION: abn LFTs   Current Outpatient Medications on File Prior to  Visit  Medication Sig Dispense Refill  . acetaminophen (TYLENOL) 500 MG tablet Take 1,000 mg by mouth every 6 (six) hours as needed for mild pain.     Marland Kitchen amLODipine (NORVASC) 5 MG tablet TAKE 1 TABLET (5 MG TOTAL) BY MOUTH DAILY. 90 tablet 2  . atorvastatin (LIPITOR) 40 MG tablet Take 1 tablet (40 mg total) by mouth daily. Please call and schedule an appointment for further refills 1st attempt 90 tablet 0  . b complex vitamins tablet Take 1 tablet by mouth daily.    . Cholecalciferol 1000 UNITS tablet Take 1,000 Units by mouth daily.      Marland Kitchen FARXIGA 10 MG TABS tablet TAKE 1 TABLET BY MOUTH EVERY DAY 90 tablet 2  . fenofibrate (TRICOR) 145 MG tablet Take 1 tablet (145 mg total) by mouth daily. Please call and schedule an appointment for further refills 1st attempt 90 tablet 0  . glucose blood (ONE TOUCH ULTRA TEST) test strip USE ONCE DAILY AS NEEDED 50 each 3  . lisinopril (PRINIVIL,ZESTRIL) 20 MG tablet TAKE 1 TABLET (20 MG TOTAL) BY MOUTH DAILY. 90 tablet 1  . metFORMIN (GLUCOPHAGE) 500 MG tablet TAKE 1 TABLET (500 MG TOTAL) BY MOUTH 3 (THREE) TIMES DAILY. 270 tablet 2  .  metoprolol tartrate (LOPRESSOR) 50 MG tablet TAKE 1 TABLET (50 MG TOTAL) BY MOUTH 2 (TWO) TIMES DAILY. 180 tablet 3  . naproxen (NAPROSYN) 500 MG tablet TAKE 1 TABLET (500 MG TOTAL) BY MOUTH 2 (TWO) TIMES DAILY AS NEEDED. 180 tablet 1  . omega-3 acid ethyl esters (LOVAZA) 1 g capsule Take 2 capsules (2 g total) by mouth 2 (two) times daily. 360 capsule 3  . VITAMIN K PO Take 1 tablet by mouth daily.     . Diclofenac Sodium (PENNSAID) 2 % SOLN Place 2 g 2 (two) times daily onto the skin. (Patient not taking: Reported on 05/16/2018) 112 g 3   No current facility-administered medications on file prior to visit.    Review of Systems  Constitutional: Negative for other unusual diaphoresis or sweats HENT: Negative for ear discharge or swelling Eyes: Negative for other worsening visual disturbances Respiratory: Negative for stridor  or other swelling  Gastrointestinal: Negative for worsening distension or other blood Genitourinary: Negative for retention or other urinary change Musculoskeletal: Negative for other MSK pain or swelling Skin: Negative for color change or other new lesions Neurological: Negative for worsening tremors and other numbness  Psychiatric/Behavioral: Negative for worsening agitation or other fatigue All other system neg per pt    Objective:   Physical Exam BP 120/80 (BP Location: Left Arm, Patient Position: Sitting, Cuff Size: Large)   Pulse 80   Ht 6\' 1"  (1.854 m)   Wt 249 lb (112.9 kg)   SpO2 96%   BMI 32.85 kg/m  VS noted,  Constitutional: Pt appears in NAD HENT: Head: NCAT.  Right Ear: External ear normal.  Left Ear: External ear normal.  Eyes: . Pupils are equal, round, and reactive to light. Conjunctivae and EOM are normal Nose: without d/c or deformity Neck: Neck supple. Gross normal ROM Cardiovascular: Normal rate and regular rhythm.   Pulmonary/Chest: Effort normal and breath sounds without rales or wheezing.  Abd:  Soft, NT, ND, + BS, no organomegaly Neurological: Pt is alert. At baseline orientation, motor grossly intact Skin: Skin is warm. No rashes, other new lesions, no LE edema Psychiatric: Pt behavior is normal without agitation  No other exam findings    Assessment & Plan:

## 2018-05-16 NOTE — Patient Instructions (Signed)
OK to decrease the glipizide to the ER 2.5 mg per day  Please continue all other medications as before, and refills have been done if requested.  Please have the pharmacy call with any other refills you may need.  Please continue your efforts at being more active, low cholesterol diet, and weight control.  Please keep your appointments with your specialists as you may have planned  Please return in 6 months, or sooner if needed, with Lab testing done 3-5 days before

## 2018-05-18 NOTE — Assessment & Plan Note (Signed)
stable overall by history and exam, recent data reviewed with pt, and pt to continue medical treatment as before,  to f/u any worsening symptoms or concerns Lab Results  Component Value Date   LDLCALC 76 10/24/2017

## 2018-05-18 NOTE — Assessment & Plan Note (Addendum)
Lab Results  Component Value Date   HGBA1C 5.9 05/13/2018  stable overall by history and exam, recent data reviewed with pt, and pt to continue medical treatment as before except to decrease the glipizide ER to 2.5 mg daily,  to f/u any worsening symptoms or concerns

## 2018-05-18 NOTE — Assessment & Plan Note (Signed)
stable overall by history and exam, recent data reviewed with pt, and pt to continue medical treatment as before,  to f/u any worsening symptoms or concerns BP Readings from Last 3 Encounters:  05/16/18 120/80  12/12/17 130/80  10/23/17 130/84

## 2018-05-24 ENCOUNTER — Other Ambulatory Visit: Payer: Self-pay | Admitting: Internal Medicine

## 2018-07-20 IMAGING — DX DG SHOULDER 2+V*R*
3 series · 3 of 3 positions shown · non-contrast
Comparison: None.

CLINICAL DATA: Right shoulder pain after fall 4 months ago.

EXAM:
RIGHT SHOULDER - 2+ VIEW

[grashey]
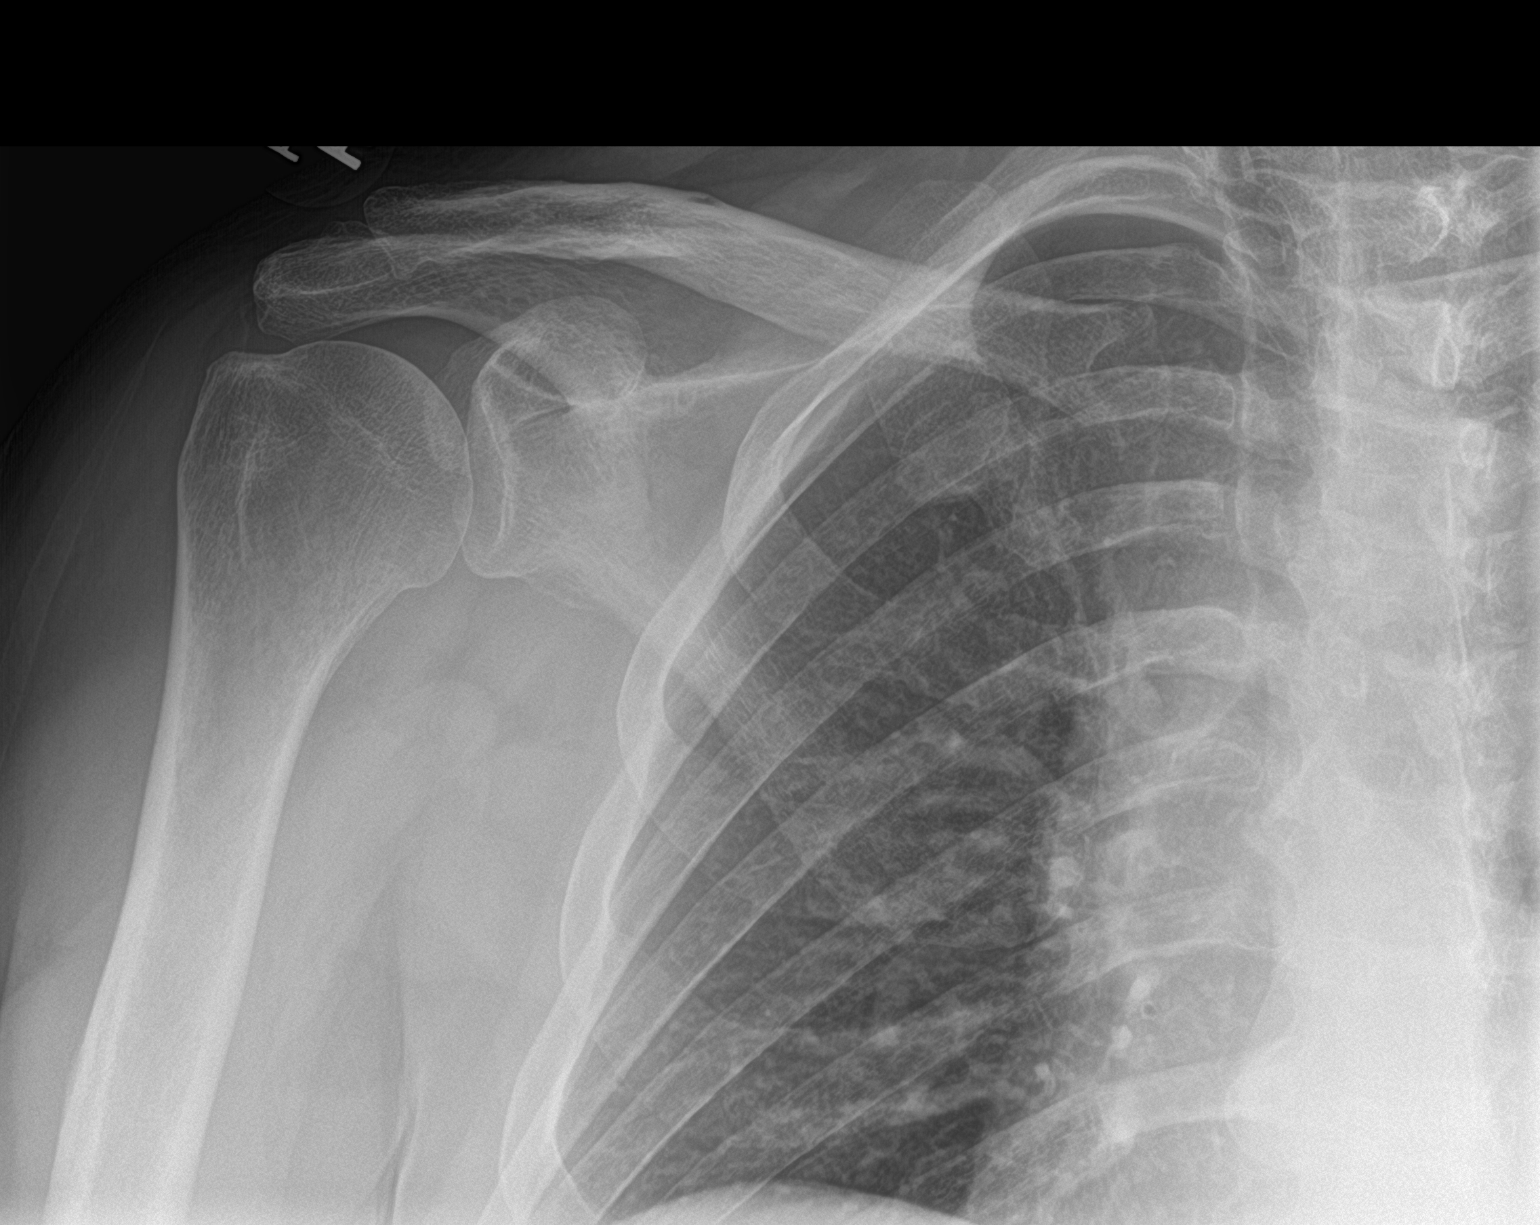

[y view]
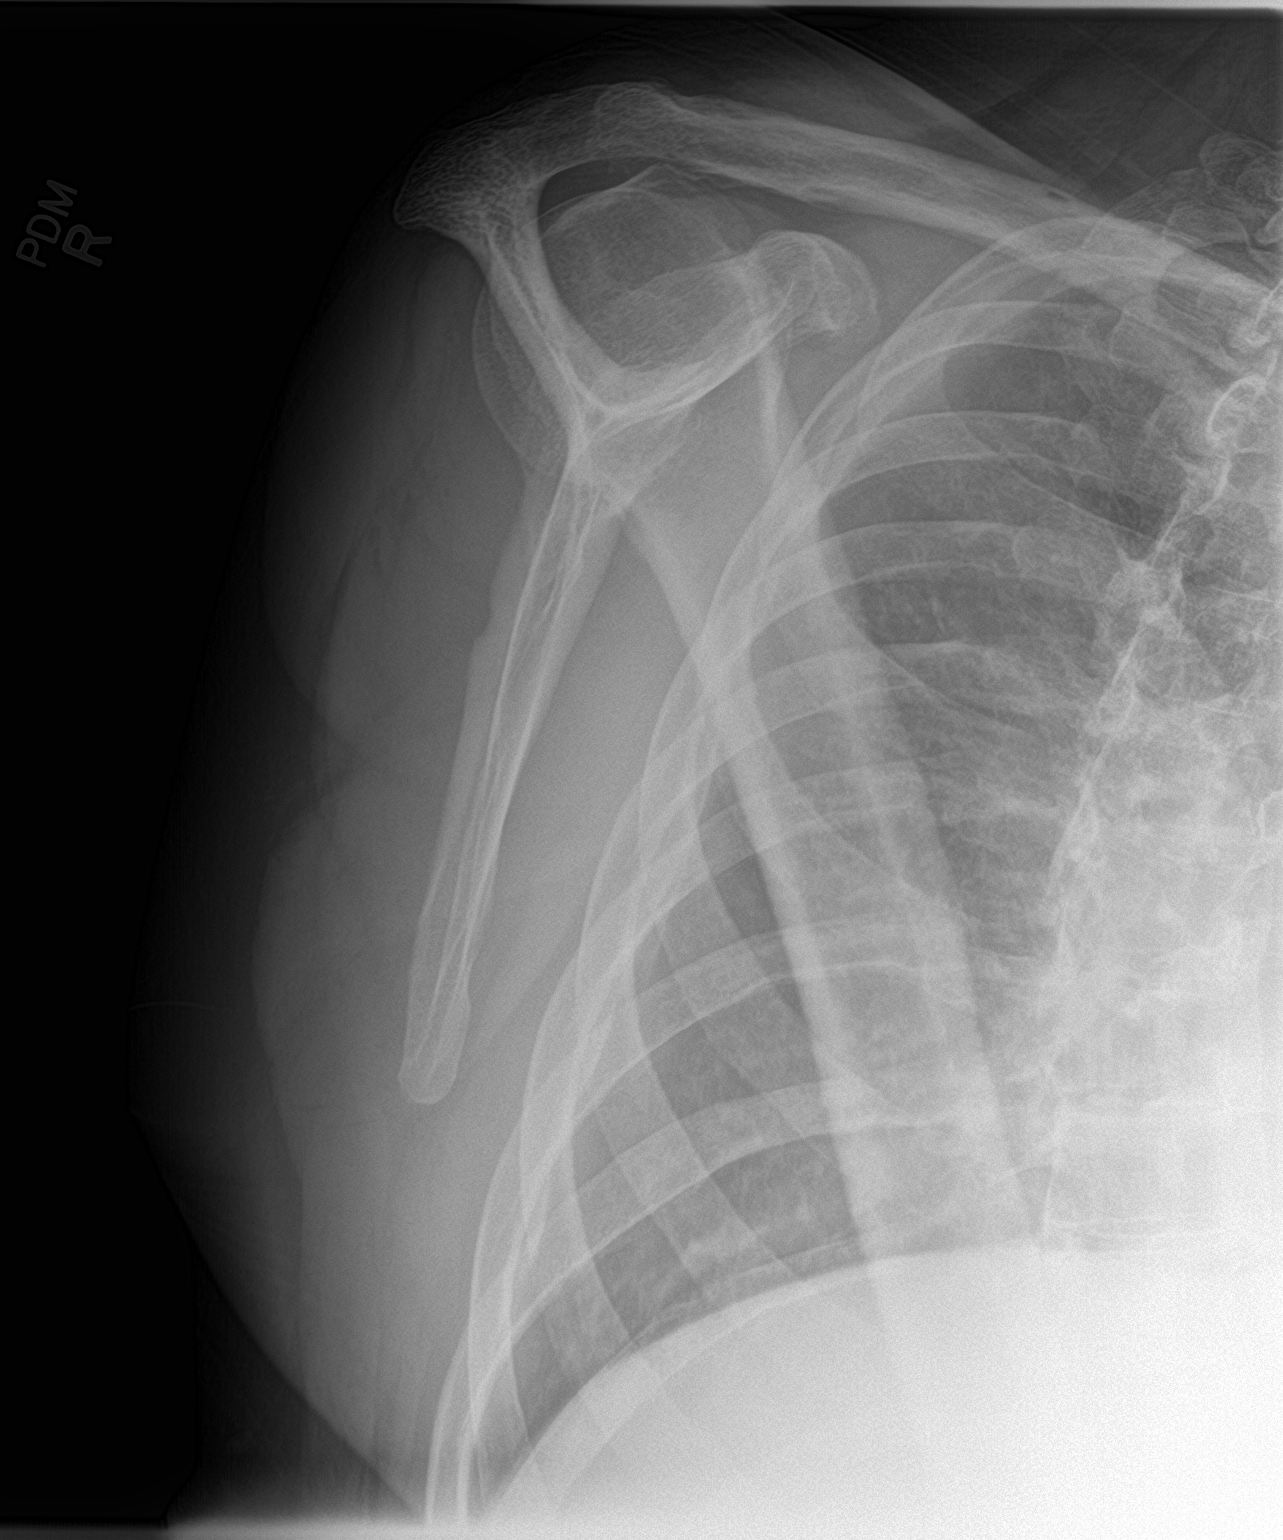

[shoulder axial]
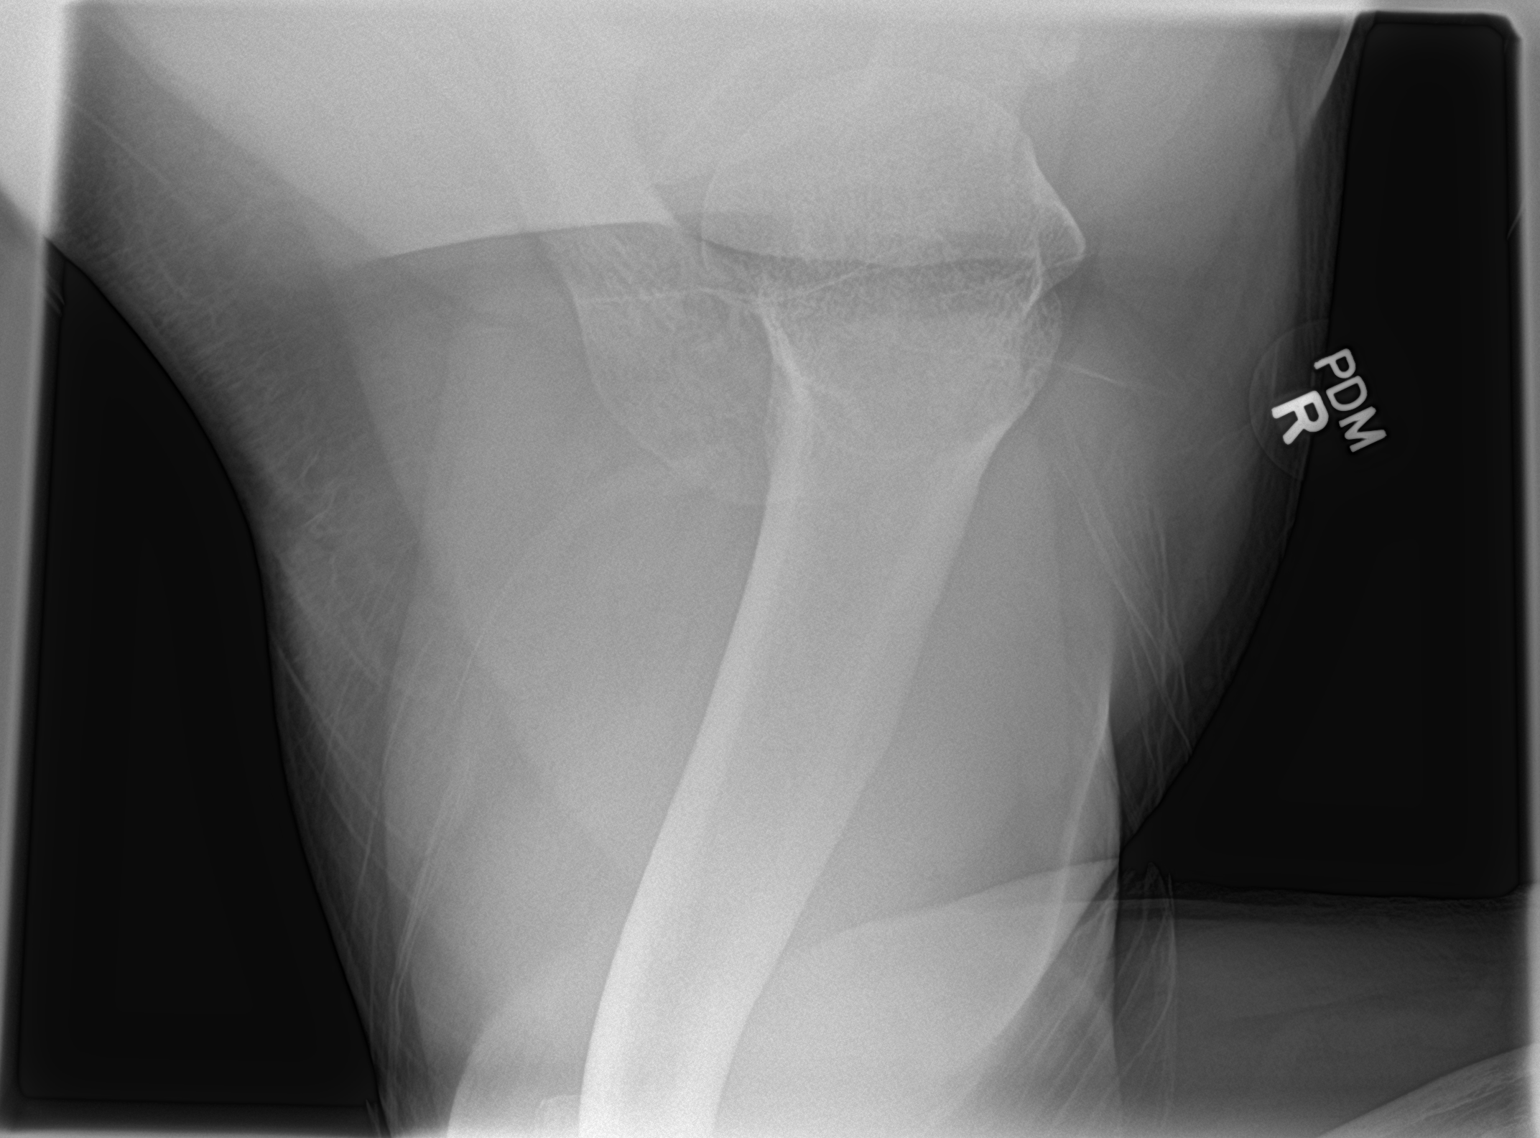

[3 of 3 positions shown; findings below may reference images not displayed]

FINDINGS: There is no evidence of fracture or dislocation. There is no
evidence of arthropathy or other focal bone abnormality. Soft
tissues are unremarkable.
IMPRESSION: Normal right shoulder.

## 2018-07-25 ENCOUNTER — Other Ambulatory Visit: Payer: Self-pay | Admitting: Physician Assistant

## 2018-08-06 ENCOUNTER — Other Ambulatory Visit: Payer: Self-pay | Admitting: Internal Medicine

## 2018-08-24 ENCOUNTER — Other Ambulatory Visit: Payer: Self-pay | Admitting: Physician Assistant

## 2018-08-28 ENCOUNTER — Other Ambulatory Visit: Payer: Self-pay | Admitting: Internal Medicine

## 2018-08-31 ENCOUNTER — Other Ambulatory Visit: Payer: Self-pay | Admitting: Physician Assistant

## 2018-09-01 ENCOUNTER — Other Ambulatory Visit: Payer: Self-pay | Admitting: Physician Assistant

## 2018-09-20 ENCOUNTER — Other Ambulatory Visit: Payer: Self-pay | Admitting: Physician Assistant

## 2018-09-21 ENCOUNTER — Other Ambulatory Visit: Payer: Self-pay | Admitting: Internal Medicine

## 2018-09-21 DIAGNOSIS — I10 Essential (primary) hypertension: Secondary | ICD-10-CM

## 2018-10-18 ENCOUNTER — Other Ambulatory Visit: Payer: Self-pay | Admitting: Physician Assistant

## 2018-10-18 ENCOUNTER — Other Ambulatory Visit: Payer: Self-pay | Admitting: Internal Medicine

## 2018-10-21 NOTE — Telephone Encounter (Signed)
It looks like Dr.John (PCP) has been checking lipids.  I would def to PCP to refill. Thank you.

## 2018-10-30 ENCOUNTER — Other Ambulatory Visit: Payer: Self-pay | Admitting: Internal Medicine

## 2018-10-30 DIAGNOSIS — E1165 Type 2 diabetes mellitus with hyperglycemia: Principal | ICD-10-CM

## 2018-10-30 DIAGNOSIS — IMO0001 Reserved for inherently not codable concepts without codable children: Secondary | ICD-10-CM

## 2018-11-01 ENCOUNTER — Other Ambulatory Visit: Payer: Self-pay | Admitting: Internal Medicine

## 2018-11-01 DIAGNOSIS — I10 Essential (primary) hypertension: Secondary | ICD-10-CM

## 2018-11-14 ENCOUNTER — Ambulatory Visit: Payer: 59 | Admitting: Internal Medicine

## 2018-11-14 ENCOUNTER — Other Ambulatory Visit (INDEPENDENT_AMBULATORY_CARE_PROVIDER_SITE_OTHER): Payer: 59

## 2018-11-14 ENCOUNTER — Encounter: Payer: Self-pay | Admitting: Internal Medicine

## 2018-11-14 VITALS — BP 124/84 | HR 83 | Temp 97.9°F | Ht 73.0 in | Wt 258.0 lb

## 2018-11-14 DIAGNOSIS — E11649 Type 2 diabetes mellitus with hypoglycemia without coma: Secondary | ICD-10-CM | POA: Diagnosis not present

## 2018-11-14 DIAGNOSIS — Z0000 Encounter for general adult medical examination without abnormal findings: Secondary | ICD-10-CM

## 2018-11-14 LAB — URINALYSIS, ROUTINE W REFLEX MICROSCOPIC
Bilirubin Urine: NEGATIVE
Hgb urine dipstick: NEGATIVE
Ketones, ur: NEGATIVE
Leukocytes, UA: NEGATIVE
NITRITE: NEGATIVE
RBC / HPF: NONE SEEN (ref 0–?)
Specific Gravity, Urine: 1.02 (ref 1.000–1.030)
Total Protein, Urine: NEGATIVE
Urine Glucose: >=1000 — AB
Urobilinogen, UA: 0.2 (ref 0.0–1.0)
pH: 5.5 (ref 5.0–8.0)

## 2018-11-14 LAB — TSH: TSH: 1.19 u[IU]/mL (ref 0.35–4.50)

## 2018-11-14 LAB — CBC WITH DIFFERENTIAL/PLATELET
BASOS PCT: 0.8 % (ref 0.0–3.0)
Basophils Absolute: 0 10*3/uL (ref 0.0–0.1)
Eosinophils Absolute: 0.3 10*3/uL (ref 0.0–0.7)
Eosinophils Relative: 4.2 % (ref 0.0–5.0)
HCT: 47.1 % (ref 39.0–52.0)
Hemoglobin: 15.8 g/dL (ref 13.0–17.0)
Lymphocytes Relative: 27.1 % (ref 12.0–46.0)
Lymphs Abs: 1.6 10*3/uL (ref 0.7–4.0)
MCHC: 33.5 g/dL (ref 30.0–36.0)
MCV: 85.1 fl (ref 78.0–100.0)
Monocytes Absolute: 0.6 10*3/uL (ref 0.1–1.0)
Monocytes Relative: 10.5 % (ref 3.0–12.0)
Neutro Abs: 3.5 10*3/uL (ref 1.4–7.7)
Neutrophils Relative %: 57.4 % (ref 43.0–77.0)
Platelets: 214 10*3/uL (ref 150.0–400.0)
RBC: 5.54 Mil/uL (ref 4.22–5.81)
RDW: 14.5 % (ref 11.5–15.5)
WBC: 6.1 10*3/uL (ref 4.0–10.5)

## 2018-11-14 LAB — BASIC METABOLIC PANEL
BUN: 18 mg/dL (ref 6–23)
CO2: 25 mEq/L (ref 19–32)
Calcium: 9.2 mg/dL (ref 8.4–10.5)
Chloride: 106 mEq/L (ref 96–112)
Creatinine, Ser: 1.01 mg/dL (ref 0.40–1.50)
GFR: 81.18 mL/min (ref >60.00)
Glucose, Bld: 112 mg/dL — ABNORMAL HIGH (ref 70–99)
Potassium: 4.3 mEq/L (ref 3.5–5.1)
Sodium: 139 mEq/L (ref 135–145)

## 2018-11-14 LAB — HEPATIC FUNCTION PANEL
ALBUMIN: 4.4 g/dL (ref 3.5–5.2)
ALT: 22 U/L (ref 0–53)
AST: 19 U/L (ref 0–37)
Alkaline Phosphatase: 74 U/L (ref 39–117)
Bilirubin, Direct: 0.1 mg/dL (ref 0.0–0.3)
Total Bilirubin: 0.5 mg/dL (ref 0.2–1.2)
Total Protein: 6.8 g/dL (ref 6.0–8.3)

## 2018-11-14 LAB — LIPID PANEL
Cholesterol: 111 mg/dL (ref 0–200)
HDL: 22.5 mg/dL — ABNORMAL LOW (ref >39.00)
NonHDL: 88.66
Total CHOL/HDL Ratio: 5
Triglycerides: 335 mg/dL — ABNORMAL HIGH (ref 0.0–149.0)
VLDL: 67 mg/dL — ABNORMAL HIGH (ref 0.0–40.0)

## 2018-11-14 LAB — MICROALBUMIN / CREATININE URINE RATIO
Creatinine,U: 78 mg/dL
MICROALB/CREAT RATIO: 0.9 mg/g (ref 0.0–30.0)
Microalb, Ur: <0.7 mg/dL (ref 0.0–1.9)

## 2018-11-14 LAB — PSA: PSA: 1.9 ng/mL (ref 0.10–4.00)

## 2018-11-14 LAB — HEMOGLOBIN A1C: Hgb A1c MFr Bld: 6.4 % (ref 4.6–6.5)

## 2018-11-14 LAB — LDL CHOLESTEROL, DIRECT: Direct LDL: 49 mg/dL

## 2018-11-14 MED ORDER — FENOFIBRATE 145 MG PO TABS: 145.0000 mg | ORAL_TABLET | Freq: Every day | ORAL | 3 refills | Status: DC

## 2018-11-14 MED ORDER — OMEGA-3-ACID ETHYL ESTERS 1 G PO CAPS: 3.0000 g | ORAL_CAPSULE | Freq: Every day | ORAL | 3 refills | Status: DC

## 2018-11-14 MED ORDER — NAPROXEN 500 MG PO TABS: ORAL_TABLET | ORAL | 1 refills | Status: DC

## 2018-11-14 NOTE — Progress Notes (Signed)
Subjective:    Patient ID: Kevin Miller, male    DOB: October 26, 1961, 57 y.o.   MRN: [id number]  HPI  Here for wellness and f/u;  Overall doing ok;  Pt denies Chest pain, worsening SOB, DOE, wheezing, orthopnea, PND, worsening LE edema, palpitations, dizziness or syncope.  Pt denies neurological change such as new headache, facial or extremity weakness.  Pt denies polydipsia, polyuria, or low sugar symptoms. Pt states overall good compliance with treatment and medications, good tolerability, and has been trying to follow appropriate diet.  Pt denies worsening depressive symptoms, suicidal ideation or panic. No fever, night sweats, wt loss, loss of appetite, or other constitutional symptoms.  Pt states good ability with ADL's, has low fall risk, home safety reviewed and adequate, no other significant changes in hearing or vision, and only occasionally active with exercise.  Still has some right shoulder pain and reduced ROM , not wanting ortho referral for now.  Has gained some wt with less activity and increased calories.  Has gotten away from a loewr carb diet.  Does not want to return to lipid clinic due to time and cost Wt Readings from Last 3 Encounters:  11/14/18 258 lb (117 kg)  05/16/18 249 lb (112.9 kg)  12/12/17 251 lb (113.9 kg)   Past Medical History:  Diagnosis Date  . Clostridium difficile infection 2010  . Diabetes mellitus   . Elevated LFTs 2011  . Fatty liver   . Hyperlipidemia   . Hypertension   . Hypogonadism male 2012  . Leg fracture, left    metal rod  . Neck pain    Left  . Osteopenia 2011   back pain  . Pancreatic pseudocyst    Resolved  . Pancreatitis 2010   ? etiol  . Vitamin B 12 deficiency    Past Surgical History:  Procedure Laterality Date  . CHOLECYSTECTOMY    . ELBOW SURGERY     right  . HERNIA REPAIR    . LEG SURGERY     Left fracture  . TIBIA FRACTURE SURGERY     left    reports that he has never smoked. He has never used smokeless tobacco.  He reports that he drinks alcohol. He reports that he does not use drugs. family history includes Alcohol abuse in his father; COPD in his mother; Colon polyps in his father; Peripheral vascular disease in his mother. Allergies  Allergen Reactions  . Gemfibrozil     REACTION: abn LFTs   Current Outpatient Medications on File Prior to Visit  Medication Sig Dispense Refill  . acetaminophen (TYLENOL) 500 MG tablet Take 1,000 mg by mouth every 6 (six) hours as needed for mild pain.     Marland Kitchen amLODipine (NORVASC) 5 MG tablet TAKE 1 TABLET (5 MG TOTAL) BY MOUTH DAILY. 90 tablet 2  . atorvastatin (LIPITOR) 40 MG tablet Take 1 tablet (40 mg total) by mouth daily at 6 PM. Please make overdue appt with Dr. Harrington Challenger before anymore refills. 3rd and Final Attempt 15 tablet 0  . b complex vitamins tablet Take 1 tablet by mouth daily.    . Diclofenac Sodium (PENNSAID) 2 % SOLN Place 2 g 2 (two) times daily onto the skin. 112 g 3  . FARXIGA 10 MG TABS tablet TAKE 1 TABLET BY MOUTH EVERY DAY 90 tablet 2  . glipiZIDE (GLUCOTROL XL) 2.5 MG 24 hr tablet Take 1 tablet (2.5 mg total) by mouth daily with breakfast. 90 tablet 3  .  glucose blood (ONE TOUCH ULTRA TEST) test strip USE ONCE DAILY AS NEEDED 50 each 3  . lisinopril (PRINIVIL,ZESTRIL) 20 MG tablet TAKE 1 TABLET (20 MG TOTAL) BY MOUTH DAILY. 90 tablet 1  . metFORMIN (GLUCOPHAGE) 500 MG tablet TAKE 1 TABLET (500 MG TOTAL) BY MOUTH 3 (THREE) TIMES DAILY. 270 tablet 1  . metoprolol tartrate (LOPRESSOR) 50 MG tablet TAKE 1 TABLET BY MOUTH TWICE A DAY 180 tablet 1  . VITAMIN K PO Take 1 tablet by mouth daily.      No current facility-administered medications on file prior to visit.    Review of Systems Constitutional: Negative for other unusual diaphoresis, sweats, appetite or weight changes HENT: Negative for other worsening hearing loss, ear pain, facial swelling, mouth sores or neck stiffness.   Eyes: Negative for other worsening pain, redness or other visual  disturbance.  Respiratory: Negative for other stridor or swelling Cardiovascular: Negative for other palpitations or other chest pain  Gastrointestinal: Negative for worsening diarrhea or loose stools, blood in stool, distention or other pain Genitourinary: Negative for hematuria, flank pain or other change in urine volume.  Musculoskeletal: Negative for myalgias or other joint swelling.  Skin: Negative for other color change, or other wound or worsening drainage.  Neurological: Negative for other syncope or numbness. Hematological: Negative for other adenopathy or swelling Psychiatric/Behavioral: Negative for hallucinations, other worsening agitation, SI, self-injury, or new decreased concentration \\All  other system neg per pt    Objective:   Physical Exam BP 124/84   Pulse 83   Temp 97.9 F (36.6 C) (Oral)   Ht 6\' 1"  (1.854 m)   Wt 258 lb (117 kg)   SpO2 95%   BMI 34.04 kg/m  VS noted,  Constitutional: Pt is oriented to person, place, and time. Appears well-developed and well-nourished, in no significant distress and comfortable Head: Normocephalic and atraumatic  Eyes: Conjunctivae and EOM are normal. Pupils are equal, round, and reactive to light Right Ear: External ear normal without discharge Left Ear: External ear normal without discharge Nose: Nose without discharge or deformity Mouth/Throat: Oropharynx is without other ulcerations and moist  Neck: Normal range of motion. Neck supple. No JVD present. No tracheal deviation present or significant neck LA or mass Cardiovascular: Normal rate, regular rhythm, normal heart sounds and intact distal pulses.   Pulmonary/Chest: WOB normal and breath sounds without rales or wheezing  Abdominal: Soft. Bowel sounds are normal. NT. No HSM  Musculoskeletal: Normal range of motion. Exhibits no edema Lymphadenopathy: Has no other cervical adenopathy.  Neurological: Pt is alert and oriented to person, place, and time. Pt has normal  reflexes. No cranial nerve deficit. Motor grossly intact, Gait intact Skin: Skin is warm and dry. No rash noted or new ulcerations Psychiatric:  Has normal mood and affect. Behavior is normal without agitation No other exam findings Lab Results  Component Value Date   WBC 6.7 10/11/2017   HGB 15.8 10/11/2017   HCT 46.9 10/11/2017   PLT 260.0 10/11/2017   GLUCOSE 93 05/13/2018   CHOL 145 05/13/2018   TRIG (H) 05/13/2018    447.0 Triglyceride is over 400; calculations on Lipids are invalid.   HDL 23.10 (L) 05/13/2018   LDLDIRECT 67.0 05/13/2018   LDLCALC 76 10/24/2017   ALT 21 05/13/2018   AST 18 05/13/2018   NA 139 05/13/2018   K 4.0 05/13/2018   CL 105 05/13/2018   CREATININE 1.10 05/13/2018   BUN 24 (H) 05/13/2018   CO2 25 05/13/2018  TSH 1.00 10/11/2017   PSA 2.57 10/11/2017   HGBA1C 5.9 05/13/2018   MICROALBUR <0.7 10/11/2017         Assessment & Plan:

## 2018-11-14 NOTE — Assessment & Plan Note (Signed)
stable overall by history and exam, recent data reviewed with pt, and pt to continue medical treatment as before,  to f/u any worsening symptoms or concerns  

## 2018-11-14 NOTE — Patient Instructions (Addendum)
Please continue all other medications as before, and refills have been done if requested.  Please have the pharmacy call with any other refills you may need.  Please continue your efforts at being more active, low cholesterol diet, and weight control.  You are otherwise up to date with prevention measures today.  Please keep your appointments with your specialists as you may have planned  You will be contacted regarding the referral for: eye doctor  Please go to the LAB in the Basement (turn left off the elevator) for the tests to be done today  You will be contacted by phone if any changes need to be made immediately.  Otherwise, you will receive a letter about your results with an explanation, but please check with MyChart first.  Please remember to sign up for MyChart if you have not done so, as this will be important to you in the future with finding out test results, communicating by private email, and scheduling acute appointments online when needed.  Please return in 6 months, or sooner if needed, with Lab testing done 3-5 days before

## 2018-11-14 NOTE — Assessment & Plan Note (Signed)

## 2018-11-17 ENCOUNTER — Other Ambulatory Visit: Payer: Self-pay

## 2018-11-17 DIAGNOSIS — I10 Essential (primary) hypertension: Secondary | ICD-10-CM

## 2018-11-17 MED ORDER — ATORVASTATIN CALCIUM 40 MG PO TABS: 40.0000 mg | ORAL_TABLET | Freq: Every day | ORAL | 3 refills | Status: DC

## 2018-11-19 ENCOUNTER — Other Ambulatory Visit: Payer: Self-pay | Admitting: Internal Medicine

## 2018-11-19 DIAGNOSIS — I10 Essential (primary) hypertension: Secondary | ICD-10-CM

## 2019-01-09 DIAGNOSIS — H25012 Cortical age-related cataract, left eye: Secondary | ICD-10-CM | POA: Diagnosis not present

## 2019-01-09 DIAGNOSIS — H04123 Dry eye syndrome of bilateral lacrimal glands: Secondary | ICD-10-CM | POA: Diagnosis not present

## 2019-01-09 DIAGNOSIS — H1045 Other chronic allergic conjunctivitis: Secondary | ICD-10-CM | POA: Diagnosis not present

## 2019-01-09 LAB — HM DIABETES EYE EXAM

## 2019-03-16 ENCOUNTER — Other Ambulatory Visit: Payer: Self-pay | Admitting: Internal Medicine

## 2019-03-16 DIAGNOSIS — I10 Essential (primary) hypertension: Secondary | ICD-10-CM

## 2019-04-29 ENCOUNTER — Other Ambulatory Visit: Payer: Self-pay | Admitting: Internal Medicine

## 2019-04-29 DIAGNOSIS — IMO0001 Reserved for inherently not codable concepts without codable children: Secondary | ICD-10-CM

## 2019-05-02 ENCOUNTER — Other Ambulatory Visit: Payer: Self-pay | Admitting: Internal Medicine

## 2019-05-02 DIAGNOSIS — I10 Essential (primary) hypertension: Secondary | ICD-10-CM

## 2019-05-13 ENCOUNTER — Other Ambulatory Visit: Payer: Self-pay | Admitting: Internal Medicine

## 2019-05-19 ENCOUNTER — Other Ambulatory Visit: Payer: Self-pay | Admitting: Internal Medicine

## 2019-05-22 ENCOUNTER — Ambulatory Visit: Payer: 59 | Admitting: Internal Medicine

## 2019-07-28 ENCOUNTER — Other Ambulatory Visit: Payer: Self-pay | Admitting: Internal Medicine

## 2019-07-28 DIAGNOSIS — I10 Essential (primary) hypertension: Secondary | ICD-10-CM

## 2019-08-11 ENCOUNTER — Other Ambulatory Visit: Payer: Self-pay | Admitting: Internal Medicine

## 2019-08-14 ENCOUNTER — Other Ambulatory Visit: Payer: Self-pay | Admitting: Internal Medicine

## 2019-08-14 DIAGNOSIS — I10 Essential (primary) hypertension: Secondary | ICD-10-CM

## 2019-09-26 ENCOUNTER — Other Ambulatory Visit: Payer: Self-pay | Admitting: Internal Medicine

## 2019-09-26 DIAGNOSIS — I10 Essential (primary) hypertension: Secondary | ICD-10-CM

## 2019-11-27 ENCOUNTER — Other Ambulatory Visit: Payer: Self-pay | Admitting: Internal Medicine

## 2019-12-01 ENCOUNTER — Other Ambulatory Visit: Payer: Self-pay | Admitting: Internal Medicine

## 2019-12-01 DIAGNOSIS — I10 Essential (primary) hypertension: Secondary | ICD-10-CM

## 2019-12-01 DIAGNOSIS — E1165 Type 2 diabetes mellitus with hyperglycemia: Secondary | ICD-10-CM

## 2019-12-27 ENCOUNTER — Other Ambulatory Visit: Payer: Self-pay | Admitting: Internal Medicine

## 2019-12-27 DIAGNOSIS — I10 Essential (primary) hypertension: Secondary | ICD-10-CM

## 2019-12-27 NOTE — Telephone Encounter (Signed)
Please refill as per office routine med refill policy (all routine meds refilled for 3 mo or monthly per pt preference up to one year from last visit, then month to month grace period for 3 mo, then further med refills will have to be denied)  

## 2020-01-04 ENCOUNTER — Other Ambulatory Visit: Payer: Self-pay | Admitting: Internal Medicine

## 2020-01-04 DIAGNOSIS — E1165 Type 2 diabetes mellitus with hyperglycemia: Secondary | ICD-10-CM

## 2020-01-04 DIAGNOSIS — I10 Essential (primary) hypertension: Secondary | ICD-10-CM

## 2020-01-15 ENCOUNTER — Ambulatory Visit (INDEPENDENT_AMBULATORY_CARE_PROVIDER_SITE_OTHER): Payer: 59 | Admitting: Internal Medicine

## 2020-01-15 ENCOUNTER — Encounter: Payer: Self-pay | Admitting: Internal Medicine

## 2020-01-15 ENCOUNTER — Other Ambulatory Visit: Payer: Self-pay

## 2020-01-15 VITALS — BP 160/90 | HR 70 | Temp 98.2°F | Ht 73.0 in | Wt 263.4 lb

## 2020-01-15 DIAGNOSIS — E538 Deficiency of other specified B group vitamins: Secondary | ICD-10-CM

## 2020-01-15 DIAGNOSIS — Z125 Encounter for screening for malignant neoplasm of prostate: Secondary | ICD-10-CM | POA: Diagnosis not present

## 2020-01-15 DIAGNOSIS — Z0000 Encounter for general adult medical examination without abnormal findings: Secondary | ICD-10-CM

## 2020-01-15 DIAGNOSIS — Z0001 Encounter for general adult medical examination with abnormal findings: Secondary | ICD-10-CM

## 2020-01-15 DIAGNOSIS — E1165 Type 2 diabetes mellitus with hyperglycemia: Secondary | ICD-10-CM

## 2020-01-15 DIAGNOSIS — E559 Vitamin D deficiency, unspecified: Secondary | ICD-10-CM

## 2020-01-15 DIAGNOSIS — J309 Allergic rhinitis, unspecified: Secondary | ICD-10-CM | POA: Diagnosis not present

## 2020-01-15 DIAGNOSIS — E611 Iron deficiency: Secondary | ICD-10-CM | POA: Diagnosis not present

## 2020-01-15 DIAGNOSIS — E11649 Type 2 diabetes mellitus with hypoglycemia without coma: Secondary | ICD-10-CM

## 2020-01-15 DIAGNOSIS — I10 Essential (primary) hypertension: Secondary | ICD-10-CM | POA: Diagnosis not present

## 2020-01-15 DIAGNOSIS — Z23 Encounter for immunization: Secondary | ICD-10-CM | POA: Diagnosis not present

## 2020-01-15 LAB — CBC WITH DIFFERENTIAL/PLATELET
Basophils Absolute: 0.1 10*3/uL (ref 0.0–0.1)
Basophils Relative: 1.1 % (ref 0.0–3.0)
Eosinophils Absolute: 0.4 10*3/uL (ref 0.0–0.7)
Eosinophils Relative: 5.8 % — ABNORMAL HIGH (ref 0.0–5.0)
HCT: 47 % (ref 39.0–52.0)
Hemoglobin: 15.6 g/dL (ref 13.0–17.0)
Lymphocytes Relative: 23.1 % (ref 12.0–46.0)
Lymphs Abs: 1.5 10*3/uL (ref 0.7–4.0)
MCHC: 33.2 g/dL (ref 30.0–36.0)
MCV: 86.3 fl (ref 78.0–100.0)
Monocytes Absolute: 0.7 10*3/uL (ref 0.1–1.0)
Monocytes Relative: 10 % (ref 3.0–12.0)
Neutro Abs: 4 10*3/uL (ref 1.4–7.7)
Neutrophils Relative %: 60 % (ref 43.0–77.0)
Platelets: 225 10*3/uL (ref 150.0–400.0)
RBC: 5.44 Mil/uL (ref 4.22–5.81)
RDW: 14.2 % (ref 11.5–15.5)
WBC: 6.6 10*3/uL (ref 4.0–10.5)

## 2020-01-15 LAB — URINALYSIS, ROUTINE W REFLEX MICROSCOPIC
Bilirubin Urine: NEGATIVE
Hgb urine dipstick: NEGATIVE
Ketones, ur: NEGATIVE
Leukocytes,Ua: NEGATIVE
Nitrite: NEGATIVE
RBC / HPF: NONE SEEN (ref 0–?)
Specific Gravity, Urine: 1.025 (ref 1.000–1.030)
Total Protein, Urine: NEGATIVE
Urine Glucose: >=1000 — AB
Urobilinogen, UA: 0.2 (ref 0.0–1.0)
pH: 5.5 (ref 5.0–8.0)

## 2020-01-15 LAB — MICROALBUMIN / CREATININE URINE RATIO
Creatinine,U: 77.2 mg/dL
Microalb Creat Ratio: 0.9 mg/g (ref 0.0–30.0)
Microalb, Ur: <0.7 mg/dL (ref 0.0–1.9)

## 2020-01-15 LAB — LIPID PANEL
Cholesterol: 194 mg/dL (ref 0–200)
HDL: 22.7 mg/dL — ABNORMAL LOW (ref >39.00)
Total CHOL/HDL Ratio: 9
Triglycerides: 1328 mg/dL — ABNORMAL HIGH (ref 0.0–149.0)

## 2020-01-15 LAB — VITAMIN D 25 HYDROXY (VIT D DEFICIENCY, FRACTURES): VITD: 11.91 ng/mL — ABNORMAL LOW (ref 30.00–100.00)

## 2020-01-15 LAB — BASIC METABOLIC PANEL
BUN: 21 mg/dL (ref 6–23)
CO2: 27 mEq/L (ref 19–32)
Calcium: 9.4 mg/dL (ref 8.4–10.5)
Chloride: 103 mEq/L (ref 96–112)
Creatinine, Ser: 1.01 mg/dL (ref 0.40–1.50)
GFR: 76.06 mL/min (ref >60.00)
Glucose, Bld: 132 mg/dL — ABNORMAL HIGH (ref 70–99)
Potassium: 4.5 mEq/L (ref 3.5–5.1)
Sodium: 138 mEq/L (ref 135–145)

## 2020-01-15 LAB — HEMOGLOBIN A1C: Hgb A1c MFr Bld: 7.7 % — ABNORMAL HIGH (ref 4.6–6.5)

## 2020-01-15 LAB — HEPATIC FUNCTION PANEL
ALT: 34 U/L (ref 0–53)
AST: 30 U/L (ref 0–37)
Albumin: 4.3 g/dL (ref 3.5–5.2)
Alkaline Phosphatase: 89 U/L (ref 39–117)
Bilirubin, Direct: 0.1 mg/dL (ref 0.0–0.3)
Total Bilirubin: 0.4 mg/dL (ref 0.2–1.2)
Total Protein: 6.9 g/dL (ref 6.0–8.3)

## 2020-01-15 LAB — IBC PANEL
Iron: 42 ug/dL (ref 42–165)
Saturation Ratios: 10.8 % — ABNORMAL LOW (ref 20.0–50.0)
Transferrin: 279 mg/dL (ref 212.0–360.0)

## 2020-01-15 LAB — PSA: PSA: 1.95 ng/mL (ref 0.10–4.00)

## 2020-01-15 LAB — TSH: TSH: 1.64 u[IU]/mL (ref 0.35–4.50)

## 2020-01-15 LAB — LDL CHOLESTEROL, DIRECT: Direct LDL: 34 mg/dL

## 2020-01-15 LAB — VITAMIN B12: Vitamin B-12: 198 pg/mL — ABNORMAL LOW (ref 211–911)

## 2020-01-15 MED ORDER — LOSARTAN POTASSIUM 100 MG PO TABS: 100.0000 mg | ORAL_TABLET | Freq: Every day | ORAL | 3 refills | Status: DC

## 2020-01-15 MED ORDER — METOPROLOL TARTRATE 50 MG PO TABS: 50.0000 mg | ORAL_TABLET | Freq: Two times a day (BID) | ORAL | 3 refills | Status: DC

## 2020-01-15 MED ORDER — METFORMIN HCL 500 MG PO TABS: 500.0000 mg | ORAL_TABLET | Freq: Three times a day (TID) | ORAL | 3 refills | Status: DC

## 2020-01-15 MED ORDER — NAPROXEN 500 MG PO TABS: 500.0000 mg | ORAL_TABLET | Freq: Two times a day (BID) | ORAL | 1 refills | Status: DC | PRN

## 2020-01-15 MED ORDER — FARXIGA 10 MG PO TABS: 10.0000 mg | ORAL_TABLET | Freq: Every day | ORAL | 3 refills | Status: DC

## 2020-01-15 MED ORDER — GLIPIZIDE ER 2.5 MG PO TB24: ORAL_TABLET | ORAL | 3 refills | Status: DC

## 2020-01-15 MED ORDER — AMLODIPINE BESYLATE 5 MG PO TABS: ORAL_TABLET | ORAL | 3 refills | Status: DC

## 2020-01-15 MED ORDER — FENOFIBRATE 145 MG PO TABS: 145.0000 mg | ORAL_TABLET | Freq: Every day | ORAL | 3 refills | Status: DC

## 2020-01-15 MED ORDER — ATORVASTATIN CALCIUM 40 MG PO TABS: 40.0000 mg | ORAL_TABLET | Freq: Every day | ORAL | 3 refills | Status: DC

## 2020-01-15 NOTE — Progress Notes (Signed)
Subjective:    Patient ID: Kevin Miller, male    DOB: 09-27-1961, 59 y.o.   MRN: [id number]  HPI  Here for wellness and f/u;  Overall doing ok;  Pt denies Chest pain, worsening SOB, DOE, wheezing, orthopnea, PND, worsening LE edema, palpitations, dizziness or syncope.  Pt denies neurological change such as new headache, facial or extremity weakness.  Pt denies polydipsia, polyuria, or low sugar symptoms. Pt states overall good compliance with treatment and medications, good tolerability, and has been trying to follow appropriate diet.  Pt denies worsening depressive symptoms, suicidal ideation or panic. No fever, night sweats, wt loss, loss of appetite, or other constitutional symptoms.  Pt states good ability with ADL's, has low fall risk, home safety reviewed and adequate, no other significant changes in hearing or vision, and only occasionally active with exercise. Has recurrent left shoulder pain plans to see ortho. Working at home for [employer]. Has not checked BP at home recently. Does have several wks ongoing nasal allergy symptoms with clearish congestion, itch and sneezing, without fever, pain, ST, cough, swelling or wheezing except for cough with post nasal gtt.  Past Medical History:  Diagnosis Date  . Clostridium difficile infection 2010  . Diabetes mellitus   . Elevated LFTs 2011  . Fatty liver   . Hyperlipidemia   . Hypertension   . Hypogonadism male 2012  . Leg fracture, left    metal rod  . Neck pain    Left  . Osteopenia 2011   back pain  . Pancreatic pseudocyst    Resolved  . Pancreatitis 2010   ? etiol  . Vitamin B 12 deficiency    Past Surgical History:  Procedure Laterality Date  . CHOLECYSTECTOMY    . ELBOW SURGERY     right  . HERNIA REPAIR    . LEG SURGERY     Left fracture  . TIBIA FRACTURE SURGERY     left    reports that he has never smoked. He has never used smokeless tobacco. He reports current alcohol use. He reports that he does not use  drugs. family history includes Alcohol abuse in his father; COPD in his mother; Colon polyps in his father; Peripheral vascular disease in his mother. Allergies  Allergen Reactions  . Gemfibrozil     REACTION: abn LFTs   Current Outpatient Medications on File Prior to Visit  Medication Sig Dispense Refill  . acetaminophen (TYLENOL) 500 MG tablet Take 1,000 mg by mouth every 6 (six) hours as needed for mild pain.     Marland Kitchen b complex vitamins tablet Take 1 tablet by mouth daily.    . Diclofenac Sodium (PENNSAID) 2 % SOLN Place 2 g 2 (two) times daily onto the skin. 112 g 3  . glucose blood (ONE TOUCH ULTRA TEST) test strip USE ONCE DAILY AS NEEDED 50 each 3  . omega-3 acid ethyl esters (LOVAZA) 1 g capsule TAKE 3 CAPSULES (3 G TOTAL) BY MOUTH AT BEDTIME. 270 capsule 3  . VITAMIN K PO Take 1 tablet by mouth daily.      No current facility-administered medications on file prior to visit.   Review of Systems All otherwise neg per pt     Objective:   Physical Exam BP (!) 160/90   Pulse 70   Temp 98.2 F (36.8 C)   Ht 6\' 1"  (1.854 m)   Wt 263 lb 6.4 oz (119.5 kg)   SpO2 99%   BMI 34.75 kg/m  VS noted,  Constitutional: Pt appears in NAD HENT: Head: NCAT.  Right Ear: External ear normal.  Left Ear: External ear normal.  Eyes: . Pupils are equal, round, and reactive to light. Conjunctivae and EOM are normal Nose: without d/c or deformity Neck: Neck supple. Gross normal ROM Cardiovascular: Normal rate and regular rhythm.   Pulmonary/Chest: Effort normal and breath sounds without rales or wheezing.  Abd:  Soft, NT, ND, + BS, no organomegaly Neurological: Pt is alert. At baseline orientation, motor grossly intact Skin: Skin is warm. No rashes, other new lesions, no LE edema Psychiatric: Pt behavior is normal without agitation  All otherwise neg per pt Lab Results  Component Value Date   WBC 6.6 01/15/2020   HGB 15.6 01/15/2020   HCT 47.0 01/15/2020   PLT 225.0 01/15/2020    GLUCOSE 132 (H) 01/15/2020   CHOL 194 01/15/2020   TRIG (H) 01/15/2020    1328.0 Triglyceride is over 400; calculations on Lipids are invalid.   HDL 22.70 (L) 01/15/2020   LDLDIRECT 34.0 01/15/2020   LDLCALC 76 10/24/2017   ALT 34 01/15/2020   AST 30 01/15/2020   NA 138 01/15/2020   K 4.5 01/15/2020   CL 103 01/15/2020   CREATININE 1.01 01/15/2020   BUN 21 01/15/2020   CO2 27 01/15/2020   TSH 1.64 01/15/2020   PSA 1.95 01/15/2020   HGBA1C 7.7 (H) 01/15/2020   MICROALBUR <0.7 01/15/2020      Assessment & Plan:

## 2020-01-15 NOTE — Patient Instructions (Addendum)
You had the flu shot today  You had the shingles shot #1 today  Ok to change the lisinopril to losartan 100 mg per day  Ok to restart the nasacort OTC for the allergies  Please continue all other medications as before, and refills have been done if requested.  Please have the pharmacy call with any other refills you may need.  Please continue your efforts at being more active, low cholesterol diet, and weight control.  You are otherwise up to date with prevention measures today.  Please keep your appointments with your specialists as you may have planned  Please go to the LAB at the blood drawing area for the tests to be done  You will be contacted by phone if any changes need to be made immediately.  Otherwise, you will receive a letter about your results with an explanation, but please check with MyChart first.  Please remember to sign up for MyChart if you have not done so, as this will be important to you in the future with finding out test results, communicating by private email, and scheduling acute appointments online when needed.  Please make an Appointment to return in 6 months, or sooner if needed, also with Lab Appointment for testing done 3-5 days before at the [facility] (so this is for TWO appointments - please see the scheduling desk as you leave)

## 2020-01-17 ENCOUNTER — Encounter: Payer: Self-pay | Admitting: Internal Medicine

## 2020-01-17 DIAGNOSIS — J309 Allergic rhinitis, unspecified: Secondary | ICD-10-CM | POA: Insufficient documentation

## 2020-01-17 NOTE — Assessment & Plan Note (Signed)

## 2020-01-17 NOTE — Assessment & Plan Note (Signed)
stable overall by history and exam, recent data reviewed with pt, and pt to continue medical treatment as before,  to f/u any worsening symptoms or concerns  

## 2020-01-17 NOTE — Assessment & Plan Note (Signed)
For replacement 

## 2020-01-17 NOTE — Assessment & Plan Note (Signed)
With cough cant ro ace related, for d/c ace, and start losartan 100 qd

## 2020-01-17 NOTE — Assessment & Plan Note (Addendum)
Mild to mod, for nasacort and allegra asd, to f/u any worsening symptoms or concerns  I spent 31 minutes preparing to see the patient by review of recent labs, imaging and procedures, obtaining and reviewing separately obtained history, communicating with the patient and family or caregiver, ordering medications, tests or procedures, and documenting clinical information in the EHR including the differential Dx, treatment, and any further evaluation and other management of allergic rhinnitis, b12 deficiency, HTN, DM

## 2020-01-18 ENCOUNTER — Other Ambulatory Visit: Payer: Self-pay | Admitting: Internal Medicine

## 2020-01-18 MED ORDER — VITAMIN D (ERGOCALCIFEROL) 1.25 MG (50000 UNIT) PO CAPS: 50000.0000 [IU] | ORAL_CAPSULE | ORAL | 0 refills | Status: DC

## 2020-01-18 MED ORDER — GLIPIZIDE ER 5 MG PO TB24: 5.0000 mg | ORAL_TABLET | Freq: Every day | ORAL | 3 refills | Status: DC

## 2020-01-18 MED ORDER — VITAMIN B-12 1000 MCG PO TABS: 1000.0000 ug | ORAL_TABLET | Freq: Every day | ORAL | 1 refills | Status: DC

## 2020-03-18 ENCOUNTER — Ambulatory Visit (INDEPENDENT_AMBULATORY_CARE_PROVIDER_SITE_OTHER): Payer: 59 | Admitting: *Deleted

## 2020-03-18 ENCOUNTER — Other Ambulatory Visit: Payer: Self-pay

## 2020-03-18 DIAGNOSIS — Z23 Encounter for immunization: Secondary | ICD-10-CM | POA: Diagnosis not present

## 2020-04-10 ENCOUNTER — Other Ambulatory Visit: Payer: Self-pay | Admitting: Internal Medicine

## 2020-04-10 DIAGNOSIS — E1165 Type 2 diabetes mellitus with hyperglycemia: Secondary | ICD-10-CM

## 2020-04-10 NOTE — Telephone Encounter (Signed)
Please refill as per office routine med refill policy (all routine meds refilled for 3 mo or monthly per pt preference up to one year from last visit, then month to month grace period for 3 mo, then further med refills will have to be denied)  

## 2020-06-06 DIAGNOSIS — M25562 Pain in left knee: Secondary | ICD-10-CM | POA: Insufficient documentation

## 2020-07-08 ENCOUNTER — Other Ambulatory Visit: Payer: 59

## 2020-07-08 DIAGNOSIS — E11649 Type 2 diabetes mellitus with hypoglycemia without coma: Secondary | ICD-10-CM

## 2020-07-08 NOTE — Addendum Note (Signed)
Addended by: Steward Ros on: 07/08/2020 10:12 AM   Modules accepted: Orders

## 2020-07-09 LAB — LIPID PANEL
Cholesterol: 134 mg/dL (ref <200)
HDL: 22 mg/dL — ABNORMAL LOW (ref >=40)
Non-HDL Cholesterol (Calc): 112 mg/dL (calc) (ref <130)
Total CHOL/HDL Ratio: 6.1 (calc) — ABNORMAL HIGH (ref <5.0)
Triglycerides: 433 mg/dL — ABNORMAL HIGH (ref <150)

## 2020-07-09 LAB — HEPATIC FUNCTION PANEL
AG Ratio: 1.5 (calc) (ref 1.0–2.5)
ALT: 27 U/L (ref 9–46)
AST: 25 U/L (ref 10–35)
Albumin: 4.1 g/dL (ref 3.6–5.1)
Alkaline phosphatase (APISO): 76 U/L (ref 35–144)
Bilirubin, Direct: 0.1 mg/dL (ref 0.0–0.2)
Globulin: 2.7 g/dL (calc) (ref 1.9–3.7)
Indirect Bilirubin: 0.3 mg/dL (calc) (ref 0.2–1.2)
Total Bilirubin: 0.4 mg/dL (ref 0.2–1.2)
Total Protein: 6.8 g/dL (ref 6.1–8.1)

## 2020-07-09 LAB — BASIC METABOLIC PANEL
BUN: 18 mg/dL (ref 7–25)
CO2: 26 mmol/L (ref 20–32)
Calcium: 9.1 mg/dL (ref 8.6–10.3)
Chloride: 105 mmol/L (ref 98–110)
Creat: 0.88 mg/dL (ref 0.70–1.33)
Glucose, Bld: 95 mg/dL (ref 65–99)
Potassium: 4.4 mmol/L (ref 3.5–5.3)
Sodium: 140 mmol/L (ref 135–146)

## 2020-07-09 LAB — HEMOGLOBIN A1C
Hgb A1c MFr Bld: 6.9 % of total Hgb — ABNORMAL HIGH (ref <5.7)
Mean Plasma Glucose: 151 (calc)
eAG (mmol/L): 8.4 (calc)

## 2020-07-15 ENCOUNTER — Other Ambulatory Visit: Payer: Self-pay

## 2020-07-15 ENCOUNTER — Encounter: Payer: Self-pay | Admitting: Internal Medicine

## 2020-07-15 ENCOUNTER — Ambulatory Visit: Payer: 59 | Admitting: Internal Medicine

## 2020-07-15 VITALS — BP 140/74 | HR 68 | Temp 98.6°F | Ht 73.0 in | Wt 251.0 lb

## 2020-07-15 DIAGNOSIS — Z0001 Encounter for general adult medical examination with abnormal findings: Secondary | ICD-10-CM

## 2020-07-15 DIAGNOSIS — E782 Mixed hyperlipidemia: Secondary | ICD-10-CM

## 2020-07-15 DIAGNOSIS — E538 Deficiency of other specified B group vitamins: Secondary | ICD-10-CM

## 2020-07-15 DIAGNOSIS — I10 Essential (primary) hypertension: Secondary | ICD-10-CM | POA: Diagnosis not present

## 2020-07-15 DIAGNOSIS — E11649 Type 2 diabetes mellitus with hypoglycemia without coma: Secondary | ICD-10-CM | POA: Diagnosis not present

## 2020-07-15 DIAGNOSIS — E559 Vitamin D deficiency, unspecified: Secondary | ICD-10-CM | POA: Diagnosis not present

## 2020-07-15 NOTE — Progress Notes (Signed)
Subjective:    Patient ID: Kevin Miller, male    DOB: [birth year], 59 y.o.   MRN: [id number]  HPI  Here to f/u; overall doing ok,  Pt denies chest pain, increasing sob or doe, wheezing, orthopnea, PND, increased LE swelling, palpitations, dizziness or syncope.  Pt denies new neurological symptoms such as new headache, or facial or extremity weakness or numbness.  Pt denies polydipsia, polyuria, or low sugar episode.  Pt states overall good compliance with meds, mostly trying to follow appropriate diet, with wt overall stable,  but little exercise however. Pt plans to call soon for f/u eye appt. Wt Readings from Last 3 Encounters:  07/15/20 251 lb (113.9 kg)  01/15/20 263 lb 6.4 oz (119.5 kg)  11/14/18 258 lb (117 kg)   Past Medical History:  Diagnosis Date  . Clostridium difficile infection 2010  . Diabetes mellitus   . Elevated LFTs 2011  . Fatty liver   . Hyperlipidemia   . Hypertension   . Hypogonadism male 2012  . Leg fracture, left    metal rod  . Neck pain    Left  . Osteopenia 2011   back pain  . Pancreatic pseudocyst    Resolved  . Pancreatitis 2010   ? etiol  . Vitamin B 12 deficiency    Past Surgical History:  Procedure Laterality Date  . CHOLECYSTECTOMY    . ELBOW SURGERY     right  . HERNIA REPAIR    . LEG SURGERY     Left fracture  . TIBIA FRACTURE SURGERY     left    reports that he has never smoked. He has never used smokeless tobacco. He reports current alcohol use. He reports that he does not use drugs. family history includes Alcohol abuse in his father; COPD in his mother; Colon polyps in his father; Peripheral vascular disease in his mother. Allergies  Allergen Reactions  . Gemfibrozil     REACTION: abn LFTs   Current Outpatient Medications on File Prior to Visit  Medication Sig Dispense Refill  . acetaminophen (TYLENOL) 500 MG tablet Take 1,000 mg by mouth every 6 (six) hours as needed for mild pain.     Marland Kitchen amLODipine (NORVASC) 5 MG tablet  TAKE 1 TABLET (5 MG TOTAL) BY MOUTH DAILY. 90 tablet 3  . atorvastatin (LIPITOR) 40 MG tablet Take 1 tablet (40 mg total) by mouth daily at 6 PM. 90 tablet 3  . b complex vitamins tablet Take 1 tablet by mouth daily.    . dapagliflozin propanediol (FARXIGA) 10 MG TABS tablet Take 10 mg by mouth daily. 90 tablet 3  . Diclofenac Sodium (PENNSAID) 2 % SOLN Place 2 g 2 (two) times daily onto the skin. 112 g 3  . fenofibrate (TRICOR) 145 MG tablet Take 1 tablet (145 mg total) by mouth daily. 90 tablet 3  . glipiZIDE (GLUCOTROL XL) 5 MG 24 hr tablet Take 1 tablet (5 mg total) by mouth daily with breakfast. 90 tablet 3  . glucose blood (ONE TOUCH ULTRA TEST) test strip USE ONCE DAILY AS NEEDED 50 each 3  . lisinopril (ZESTRIL) 20 MG tablet lisinopril 20 mg tablet  TAKE 1 TABLET BY MOUTH EVERY DAY    . metFORMIN (GLUCOPHAGE) 500 MG tablet TAKE 1 TABLET BY MOUTH THREE TIMES A DAY 270 tablet 1  . metoprolol tartrate (LOPRESSOR) 50 MG tablet Take 1 tablet (50 mg total) by mouth 2 (two) times daily. 180 tablet 3  . naproxen (  NAPROSYN) 500 MG tablet Take 1 tablet (500 mg total) by mouth 2 (two) times daily as needed. 180 tablet 1  . omega-3 acid ethyl esters (LOVAZA) 1 g capsule TAKE 3 CAPSULES (3 G TOTAL) BY MOUTH AT BEDTIME. 270 capsule 3  . vitamin B-12 (CYANOCOBALAMIN) 1000 MCG tablet Take 1 tablet (1,000 mcg total) by mouth daily. 90 tablet 1  . VITAMIN K PO Take 1 tablet by mouth daily.      No current facility-administered medications on file prior to visit.   Review of Systems All otherwise neg per pt     Objective:   Physical Exam BP 140/74 (BP Location: Left Arm, Patient Position: Sitting, Cuff Size: Large)   Pulse 68   Temp 98.6 F (37 C) (Oral)   Ht 6\' 1"  (1.854 m)   Wt 251 lb (113.9 kg)   SpO2 98%   BMI 33.12 kg/m  VS noted,  Constitutional: Pt appears in NAD HENT: Head: NCAT.  Right Ear: External ear normal.  Left Ear: External ear normal.  Eyes: . Pupils are equal, round, and  reactive to light. Conjunctivae and EOM are normal Nose: without d/c or deformity Neck: Neck supple. Gross normal ROM Cardiovascular: Normal rate and regular rhythm.   Pulmonary/Chest: Effort normal and breath sounds without rales or wheezing.  Abd:  Soft, NT, ND, + BS, no organomegaly Neurological: Pt is alert. At baseline orientation, motor grossly intact Skin: Skin is warm. No rashes, other new lesions, no LE edema Psychiatric: Pt behavior is normal without agitation  All otherwise neg per pt Lab Results  Component Value Date   WBC 6.6 01/15/2020   HGB 15.6 01/15/2020   HCT 47.0 01/15/2020   PLT 225.0 01/15/2020   GLUCOSE 95 07/08/2020   CHOL 134 07/08/2020   TRIG 433 (H) 07/08/2020   HDL 22 (L) 07/08/2020   LDLDIRECT 34.0 01/15/2020   [place]  07/08/2020     Comment:     . LDL cholesterol not calculated. Triglyceride levels greater than 400 mg/dL invalidate calculated LDL results. . Reference range: <100 . Desirable range <100 mg/dL for primary prevention;   <70 mg/dL for patients with CHD or diabetic patients  with > or = 2 CHD risk factors. Marland Kitchen LDL-C is now calculated using the Martin-Hopkins  calculation, which is a validated novel method providing  better accuracy than the Friedewald equation in the  estimation of LDL-C.  Cresenciano Genre et al. Annamaria Helling. 0034;917(91): 2061-2068  (http://education.[facility].com/faq/FAQ164)    ALT 27 07/08/2020   AST 25 07/08/2020   NA 140 07/08/2020   K 4.4 07/08/2020   CL 105 07/08/2020   CREATININE 0.88 07/08/2020   BUN 18 07/08/2020   CO2 26 07/08/2020   TSH 1.64 01/15/2020   PSA 1.95 01/15/2020   HGBA1C 6.9 (H) 07/08/2020   MICROALBUR <0.7 01/15/2020      Assessment & Plan:

## 2020-07-15 NOTE — Assessment & Plan Note (Addendum)

## 2020-07-15 NOTE — Patient Instructions (Signed)

## 2020-07-15 NOTE — Assessment & Plan Note (Signed)
stable overall by history and exam, recent data reviewed with pt, and pt to continue medical treatment as before,  to f/u any worsening symptoms or concerns  

## 2020-11-24 ENCOUNTER — Other Ambulatory Visit: Payer: Self-pay | Admitting: Internal Medicine

## 2021-01-06 ENCOUNTER — Other Ambulatory Visit: Payer: 59

## 2021-01-11 ENCOUNTER — Other Ambulatory Visit: Payer: Self-pay | Admitting: Internal Medicine

## 2021-01-11 DIAGNOSIS — I10 Essential (primary) hypertension: Secondary | ICD-10-CM

## 2021-01-11 NOTE — Telephone Encounter (Signed)
[facility] for routine refills except the losartan

## 2021-01-13 ENCOUNTER — Ambulatory Visit: Payer: 59 | Admitting: Internal Medicine

## 2021-01-25 ENCOUNTER — Other Ambulatory Visit: Payer: Self-pay | Admitting: Internal Medicine

## 2021-01-26 ENCOUNTER — Other Ambulatory Visit: Payer: Self-pay | Admitting: Internal Medicine

## 2021-02-03 ENCOUNTER — Other Ambulatory Visit (INDEPENDENT_AMBULATORY_CARE_PROVIDER_SITE_OTHER): Payer: 59

## 2021-02-03 ENCOUNTER — Other Ambulatory Visit: Payer: Self-pay

## 2021-02-03 DIAGNOSIS — E538 Deficiency of other specified B group vitamins: Secondary | ICD-10-CM | POA: Diagnosis not present

## 2021-02-03 DIAGNOSIS — Z125 Encounter for screening for malignant neoplasm of prostate: Secondary | ICD-10-CM | POA: Diagnosis not present

## 2021-02-03 DIAGNOSIS — E11649 Type 2 diabetes mellitus with hypoglycemia without coma: Secondary | ICD-10-CM

## 2021-02-03 DIAGNOSIS — E559 Vitamin D deficiency, unspecified: Secondary | ICD-10-CM | POA: Diagnosis not present

## 2021-02-03 DIAGNOSIS — Z0001 Encounter for general adult medical examination with abnormal findings: Secondary | ICD-10-CM

## 2021-02-03 LAB — LIPID PANEL
Cholesterol: 114 mg/dL (ref 0–200)
HDL: 21.8 mg/dL — ABNORMAL LOW (ref >39.00)
NonHDL: 92.48
Total CHOL/HDL Ratio: 5
Triglycerides: 299 mg/dL — ABNORMAL HIGH (ref 0.0–149.0)
VLDL: 59.8 mg/dL — ABNORMAL HIGH (ref 0.0–40.0)

## 2021-02-03 LAB — URINALYSIS, ROUTINE W REFLEX MICROSCOPIC
Bilirubin Urine: NEGATIVE
Hgb urine dipstick: NEGATIVE
Ketones, ur: NEGATIVE
Leukocytes,Ua: NEGATIVE
Nitrite: NEGATIVE
RBC / HPF: NONE SEEN (ref 0–?)
Specific Gravity, Urine: 1.025 (ref 1.000–1.030)
Total Protein, Urine: NEGATIVE
Urine Glucose: >=1000 — AB
Urobilinogen, UA: 0.2 (ref 0.0–1.0)
pH: 6 (ref 5.0–8.0)

## 2021-02-03 LAB — CBC WITH DIFFERENTIAL/PLATELET
Basophils Absolute: 0.1 10*3/uL (ref 0.0–0.1)
Basophils Relative: 1.2 % (ref 0.0–3.0)
Eosinophils Absolute: 0.3 10*3/uL (ref 0.0–0.7)
Eosinophils Relative: 4.4 % (ref 0.0–5.0)
HCT: 41.5 % (ref 39.0–52.0)
Hemoglobin: 13.5 g/dL (ref 13.0–17.0)
Lymphocytes Relative: 25.2 % (ref 12.0–46.0)
Lymphs Abs: 1.8 10*3/uL (ref 0.7–4.0)
MCHC: 32.6 g/dL (ref 30.0–36.0)
MCV: 77.7 fl — ABNORMAL LOW (ref 78.0–100.0)
Monocytes Absolute: 0.6 10*3/uL (ref 0.1–1.0)
Monocytes Relative: 8.8 % (ref 3.0–12.0)
Neutro Abs: 4.3 10*3/uL (ref 1.4–7.7)
Neutrophils Relative %: 60.4 % (ref 43.0–77.0)
Platelets: 279 10*3/uL (ref 150.0–400.0)
RBC: 5.34 Mil/uL (ref 4.22–5.81)
RDW: 17.5 % — ABNORMAL HIGH (ref 11.5–15.5)
WBC: 7.1 10*3/uL (ref 4.0–10.5)

## 2021-02-03 LAB — MICROALBUMIN / CREATININE URINE RATIO
Creatinine,U: 108.6 mg/dL
Microalb Creat Ratio: 0.6 mg/g (ref 0.0–30.0)
Microalb, Ur: <0.7 mg/dL (ref 0.0–1.9)

## 2021-02-03 LAB — LDL CHOLESTEROL, DIRECT: Direct LDL: 55 mg/dL

## 2021-02-03 LAB — TSH: TSH: 1.83 u[IU]/mL (ref 0.35–4.50)

## 2021-02-03 LAB — VITAMIN B12: Vitamin B-12: 198 pg/mL — ABNORMAL LOW (ref 211–911)

## 2021-02-03 LAB — PSA: PSA: 2.12 ng/mL (ref 0.10–4.00)

## 2021-02-03 LAB — HEMOGLOBIN A1C: Hgb A1c MFr Bld: 7.1 % — ABNORMAL HIGH (ref 4.6–6.5)

## 2021-02-03 LAB — VITAMIN D 25 HYDROXY (VIT D DEFICIENCY, FRACTURES): VITD: 16.65 ng/mL — ABNORMAL LOW (ref 30.00–100.00)

## 2021-02-03 NOTE — Addendum Note (Signed)
Addended by: Raliegh Ip on: 02/03/2021 07:45 AM   Modules accepted: Orders

## 2021-02-09 ENCOUNTER — Other Ambulatory Visit: Payer: Self-pay | Admitting: Internal Medicine

## 2021-02-09 DIAGNOSIS — I10 Essential (primary) hypertension: Secondary | ICD-10-CM

## 2021-02-09 NOTE — Telephone Encounter (Signed)
Please refill as per office routine med refill policy (all routine meds refilled for 3 mo or monthly per pt preference up to one year from last visit, then month to month grace period for 3 mo, then further med refills will have to be denied)  

## 2021-02-10 ENCOUNTER — Other Ambulatory Visit: Payer: Self-pay | Admitting: Internal Medicine

## 2021-02-10 ENCOUNTER — Ambulatory Visit: Payer: 59 | Admitting: Internal Medicine

## 2021-02-10 ENCOUNTER — Encounter: Payer: Self-pay | Admitting: Internal Medicine

## 2021-02-10 ENCOUNTER — Other Ambulatory Visit: Payer: Self-pay

## 2021-02-10 VITALS — BP 146/90 | HR 82 | Ht 73.0 in | Wt 252.0 lb

## 2021-02-10 DIAGNOSIS — Z0000 Encounter for general adult medical examination without abnormal findings: Secondary | ICD-10-CM

## 2021-02-10 DIAGNOSIS — M25562 Pain in left knee: Secondary | ICD-10-CM

## 2021-02-10 DIAGNOSIS — E538 Deficiency of other specified B group vitamins: Secondary | ICD-10-CM | POA: Diagnosis not present

## 2021-02-10 DIAGNOSIS — E782 Mixed hyperlipidemia: Secondary | ICD-10-CM

## 2021-02-10 DIAGNOSIS — Z0001 Encounter for general adult medical examination with abnormal findings: Secondary | ICD-10-CM

## 2021-02-10 DIAGNOSIS — G8929 Other chronic pain: Secondary | ICD-10-CM

## 2021-02-10 DIAGNOSIS — I10 Essential (primary) hypertension: Secondary | ICD-10-CM | POA: Diagnosis not present

## 2021-02-10 DIAGNOSIS — E559 Vitamin D deficiency, unspecified: Secondary | ICD-10-CM | POA: Diagnosis not present

## 2021-02-10 DIAGNOSIS — E1165 Type 2 diabetes mellitus with hyperglycemia: Secondary | ICD-10-CM

## 2021-02-10 MED ORDER — GLIPIZIDE ER 5 MG PO TB24: ORAL_TABLET | ORAL | 3 refills | Status: DC

## 2021-02-10 MED ORDER — AMLODIPINE BESYLATE 5 MG PO TABS: ORAL_TABLET | ORAL | 3 refills | Status: DC

## 2021-02-10 MED ORDER — OMEGA-3-ACID ETHYL ESTERS 1 G PO CAPS: 3.0000 g | ORAL_CAPSULE | Freq: Every day | ORAL | 3 refills | Status: DC

## 2021-02-10 MED ORDER — ATORVASTATIN CALCIUM 40 MG PO TABS: 40.0000 mg | ORAL_TABLET | Freq: Every day | ORAL | 3 refills | Status: DC

## 2021-02-10 MED ORDER — THERA-D 2000 50 MCG (2000 UT) PO TABS: ORAL_TABLET | ORAL | 99 refills | Status: DC

## 2021-02-10 MED ORDER — DAPAGLIFLOZIN PROPANEDIOL 10 MG PO TABS: 10.0000 mg | ORAL_TABLET | Freq: Every day | ORAL | 3 refills | Status: DC

## 2021-02-10 MED ORDER — CYANOCOBALAMIN 1000 MCG/ML IJ SOLN: 1000.0000 ug | INTRAMUSCULAR | 3 refills | Status: DC

## 2021-02-10 MED ORDER — METFORMIN HCL 500 MG PO TABS: 500.0000 mg | ORAL_TABLET | Freq: Three times a day (TID) | ORAL | 3 refills | Status: DC

## 2021-02-10 MED ORDER — FENOFIBRATE 145 MG PO TABS: 145.0000 mg | ORAL_TABLET | Freq: Every day | ORAL | 3 refills | Status: DC

## 2021-02-10 MED ORDER — LOSARTAN POTASSIUM 100 MG PO TABS: 100.0000 mg | ORAL_TABLET | Freq: Every day | ORAL | 3 refills | Status: DC

## 2021-02-10 MED ORDER — CYANOCOBALAMIN 1000 MCG/ML IJ SOLN
1000.0000 ug | Freq: Once | INTRAMUSCULAR | Status: AC
  Administered 2021-02-10: 1000 ug via INTRAMUSCULAR

## 2021-02-10 MED ORDER — METOPROLOL TARTRATE 50 MG PO TABS: 50.0000 mg | ORAL_TABLET | Freq: Two times a day (BID) | ORAL | 3 refills | Status: DC

## 2021-02-10 NOTE — Progress Notes (Signed)
Patient ID: Kevin Miller, male   DOB: 12/22/1960, 60 y.o.   MRN: [id number]         Chief Complaint:: wellness exam and Follow-up  elevated bp, b12 deficiency, vit d deficiency       HPI:  Kevin Miller is a 60 y.o. male here for wellness exam; plans to have eye exam soon. O/w up to date with immunizations and preventive referrals           Also now S/p left knee arthroscopy with meniscal tears x 2, pain improved but still there (swelling better) but still some pain to going up stairs.    Pain mild to mod, intermittent, worse x 2 mo, nothing makes better or worse.   Not taking vit d, or B12.  Needs B12 shots and willing to continue at home.  BP seems consistently borderline or mild elevated,  wants to work on diet, wt contrl.  Has been out of losartan for 1 wk   Pt denies chest pain, increased sob or doe, wheezing, orthopnea, PND, increased LE swelling, palpitations, dizziness or syncope.  Denies focal neuro s/s.   Pt denies polydipsia, polyuria,  Pt denies fever, wt loss, night sweats, loss of appetite, or other constitutional symptoms    Wt Readings from Last 3 Encounters:  02/10/21 252 lb (114.3 kg)  07/15/20 251 lb (113.9 kg)  01/15/20 263 lb 6.4 oz (119.5 kg)   BP Readings from Last 3 Encounters:  02/10/21 (!) 146/90  07/15/20 140/74  01/15/20 (!) 160/90   Immunization History  Administered Date(s) Administered  . Influenza Whole 08/25/2010, 08/17/2011  . Influenza, Seasonal, Injecte, Preservative Fre 11/17/2012  . Influenza,inj,Quad PF,6+ Mos 09/16/2014, 08/26/2015, 08/07/2017, 01/15/2020  . Influenza-Unspecified 09/09/2018  . PFIZER(Purple Top)SARS-COV-2 Vaccination 07/02/2020  . Pneumococcal Conjugate-13 10/11/2017  . Pneumococcal Polysaccharide-23 02/10/2016  . Td 08/26/2015  . Zoster Recombinat (Shingrix) 01/15/2020, 03/18/2020   Health Maintenance Due  Topic Date Due  . OPHTHALMOLOGY EXAM  01/10/2020    Past Medical History:  Diagnosis Date  . Clostridium  difficile infection 2010  . Diabetes mellitus   . Elevated LFTs 2011  . Fatty liver   . Hyperlipidemia   . Hypertension   . Hypogonadism male 2012  . Leg fracture, left    metal rod  . Neck pain    Left  . Osteopenia 2011   back pain  . Pancreatic pseudocyst    Resolved  . Pancreatitis 2010   ? etiol  . Vitamin B 12 deficiency    Past Surgical History:  Procedure Laterality Date  . CHOLECYSTECTOMY    . ELBOW SURGERY     right  . HERNIA REPAIR    . LEG SURGERY     Left fracture  . TIBIA FRACTURE SURGERY     left    reports that he has never smoked. He has never used smokeless tobacco. He reports current alcohol use. He reports that he does not use drugs. family history includes Alcohol abuse in his father; COPD in his mother; Colon polyps in his father; Peripheral vascular disease in his mother. Allergies  Allergen Reactions  . Gemfibrozil     REACTION: abn LFTs   Current Outpatient Medications on File Prior to Visit  Medication Sig Dispense Refill  . acetaminophen (TYLENOL) 500 MG tablet Take 1,000 mg by mouth every 6 (six) hours as needed for mild pain.    Marland Kitchen b complex vitamins tablet Take 1 tablet by mouth daily.    Marland Kitchen  Diclofenac Sodium (PENNSAID) 2 % SOLN Place 2 g 2 (two) times daily onto the skin. 112 g 3  . glucose blood (ONE TOUCH ULTRA TEST) test strip USE ONCE DAILY AS NEEDED 50 each 3  . naproxen (NAPROSYN) 500 MG tablet Take 1 tablet (500 mg total) by mouth 2 (two) times daily as needed. 180 tablet 1  . VITAMIN K PO Take 1 tablet by mouth daily.      No current facility-administered medications on file prior to visit.        ROS:  All others reviewed and negative.  Objective        PE:  BP (!) 146/90   Pulse 82   Ht 6\' 1"  (1.854 m)   Wt 252 lb (114.3 kg)   SpO2 97%   BMI 33.25 kg/m                 Constitutional: Pt appears in NAD               HENT: Head: NCAT.                Right Ear: External ear normal.                 Left Ear: External  ear normal.                Eyes: . Pupils are equal, round, and reactive to light. Conjunctivae and EOM are normal               Nose: without d/c or deformity               Neck: Neck supple. Gross normal ROM               Cardiovascular: Normal rate and regular rhythm.                 Pulmonary/Chest: Effort normal and breath sounds without rales or wheezing.                Abd:  Soft, NT, ND, + BS, no organomegaly               Neurological: Pt is alert. At baseline orientation, motor grossly intact               Skin: Skin is warm. No rashes, no other new lesions, LE edema - none               Psychiatric: Pt behavior is normal without agitation   Micro: none  Cardiac tracings I have personally interpreted today:  none  Pertinent Radiological findings (summarize): none   Lab Results  Component Value Date   WBC 7.1 02/03/2021   HGB 13.5 02/03/2021   HCT 41.5 02/03/2021   PLT 279.0 02/03/2021   GLUCOSE 95 07/08/2020   CHOL 114 02/03/2021   TRIG 299.0 (H) 02/03/2021   HDL 21.80 (L) 02/03/2021   LDLDIRECT 55.0 02/03/2021   [place]  07/08/2020     Comment:     . LDL cholesterol not calculated. Triglyceride levels greater than 400 mg/dL invalidate calculated LDL results. . Reference range: <100 . Desirable range <100 mg/dL for primary prevention;   <70 mg/dL for patients with CHD or diabetic patients  with > or = 2 CHD risk factors. Marland Kitchen LDL-C is now calculated using the Martin-Hopkins  calculation, which is a validated novel method providing  better accuracy than the Friedewald equation in the  estimation of LDL-C.  Cresenciano Genre et al. Annamaria Helling. 3419;622(29): 2061-2068  (http://education.[facility].com/faq/FAQ164)    ALT 27 07/08/2020   AST 25 07/08/2020   NA 140 07/08/2020   K 4.4 07/08/2020   CL 105 07/08/2020   CREATININE 0.88 07/08/2020   BUN 18 07/08/2020   CO2 26 07/08/2020   TSH 1.83 02/03/2021   PSA 2.12 02/03/2021   HGBA1C 7.1 (H) 02/03/2021   MICROALBUR  <0.7 02/03/2021   Assessment/Plan:  Kevin Miller is a 60 y.o. White or Caucasian [1] male with  has a past medical history of Clostridium difficile infection (2010), Diabetes mellitus, Elevated LFTs (2011), Fatty liver, Hyperlipidemia, Hypertension, Hypogonadism male (2012), Leg fracture, left, Neck pain, Osteopenia (2011), Pancreatic pseudocyst, Pancreatitis (2010), and Vitamin B 12 deficiency.  Pain in left knee Mild post left knee artroscopy, no falls or giveaays, exam benign, ok to follow with conservative management  Encounter for well adult exam with abnormal findings Age and sex appropriate education and counseling updated with regular exercise and diet Referrals for preventative services - none needed except for eye exam soon Immunizations addressed - none needed Smoking counseling  - none needed Evidence for depression or other mood disorder - none significant Most recent labs reviewed. I have personally reviewed and have noted: 1) the patient's medical and social history 2) The patient's current medications and supplements 3) The patient's height, weight, and BMI have been recorded in the chart   B12 deficiency For B12 1000 mg IM and f/u monthly shots  Hyperlipidemia Lab Results  Component Value Date   [facility]  07/08/2020     Comment:     . LDL cholesterol not calculated. Triglyceride levels greater than 400 mg/dL invalidate calculated LDL results. . Reference range: <100 . Desirable range <100 mg/dL for primary prevention;   <70 mg/dL for patients with CHD or diabetic patients  with > or = 2 CHD risk factors. Marland Kitchen LDL-C is now calculated using the Martin-Hopkins  calculation, which is a validated novel method providing  better accuracy than the Friedewald equation in the  estimation of LDL-C.  Cresenciano Genre et al. Annamaria Helling. 7989;211(94): 2061-2068  (http://education.[facility].com/faq/FAQ164)    Stable, pt to continue current statin lipitor 10  Current  Outpatient Medications (Endocrine & Metabolic):  .  dapagliflozin propanediol (FARXIGA) 10 MG TABS tablet, Take 1 tablet (10 mg total) by mouth daily. Marland Kitchen  glipiZIDE (GLUCOTROL XL) 5 MG 24 hr tablet, TAKE 1 TABLET BY MOUTH EVERY DAY WITH BREAKFAST .  metFORMIN (GLUCOPHAGE) 500 MG tablet, Take 1 tablet (500 mg total) by mouth 3 (three) times daily.  Current Outpatient Medications (Cardiovascular):  .  amLODipine (NORVASC) 5 MG tablet, TAKE 1 TABLET BY MOUTH EVERY DAY .  atorvastatin (LIPITOR) 40 MG tablet, Take 1 tablet (40 mg total) by mouth daily at 6 PM. .  fenofibrate (TRICOR) 145 MG tablet, Take 1 tablet (145 mg total) by mouth daily. Marland Kitchen  losartan (COZAAR) 100 MG tablet, Take 1 tablet (100 mg total) by mouth daily. .  metoprolol tartrate (LOPRESSOR) 50 MG tablet, Take 1 tablet (50 mg total) by mouth 2 (two) times daily. Marland Kitchen  omega-3 acid ethyl esters (LOVAZA) 1 g capsule, Take 3 capsules (3 g total) by mouth at bedtime.   Current Outpatient Medications (Analgesics):  .  acetaminophen (TYLENOL) 500 MG tablet, Take 1,000 mg by mouth every 6 (six) hours as needed for mild pain. .  naproxen (NAPROSYN) 500 MG tablet, Take 1 tablet (500 mg total) by mouth 2 (two) times daily as  needed.  Current Outpatient Medications (Hematological):  .  cyanocobalamin (,VITAMIN B-12,) 1000 MCG/ML injection, Inject 1 mL (1,000 mcg total) into the muscle every 30 (thirty) days.  Current Outpatient Medications (Other):  .  b complex vitamins tablet, Take 1 tablet by mouth daily. .  Cholecalciferol (THERA-D 2000) 50 MCG (2000 UT) TABS, 1 tab by mouth once daily .  Diclofenac Sodium (PENNSAID) 2 % SOLN, Place 2 g 2 (two) times daily onto the skin. Marland Kitchen  glucose blood (ONE TOUCH ULTRA TEST) test strip, USE ONCE DAILY AS NEEDED .  VITAMIN K PO, Take 1 tablet by mouth daily.    Hypertension, uncontrolled To restart losartan, cont to monitor at home and next visit BP Readings from Last 3 Encounters:  02/10/21 (!)  146/90  07/15/20 140/74  01/15/20 (!) 160/90    Type II diabetes mellitus, uncontrolled ([place]) Lab Results  Component Value Date   HGBA1C 7.1 (H) 02/03/2021   Stable, pt to continue current medical treatment farxiga, glipzide, metformin  Current Outpatient Medications (Endocrine & Metabolic):  .  dapagliflozin propanediol (FARXIGA) 10 MG TABS tablet, Take 1 tablet (10 mg total) by mouth daily. Marland Kitchen  glipiZIDE (GLUCOTROL XL) 5 MG 24 hr tablet, TAKE 1 TABLET BY MOUTH EVERY DAY WITH BREAKFAST .  metFORMIN (GLUCOPHAGE) 500 MG tablet, Take 1 tablet (500 mg total) by mouth 3 (three) times daily.  Current Outpatient Medications (Cardiovascular):  .  amLODipine (NORVASC) 5 MG tablet, TAKE 1 TABLET BY MOUTH EVERY DAY .  atorvastatin (LIPITOR) 40 MG tablet, Take 1 tablet (40 mg total) by mouth daily at 6 PM. .  fenofibrate (TRICOR) 145 MG tablet, Take 1 tablet (145 mg total) by mouth daily. Marland Kitchen  losartan (COZAAR) 100 MG tablet, Take 1 tablet (100 mg total) by mouth daily. .  metoprolol tartrate (LOPRESSOR) 50 MG tablet, Take 1 tablet (50 mg total) by mouth 2 (two) times daily. Marland Kitchen  omega-3 acid ethyl esters (LOVAZA) 1 g capsule, Take 3 capsules (3 g total) by mouth at bedtime.   Current Outpatient Medications (Analgesics):  .  acetaminophen (TYLENOL) 500 MG tablet, Take 1,000 mg by mouth every 6 (six) hours as needed for mild pain. .  naproxen (NAPROSYN) 500 MG tablet, Take 1 tablet (500 mg total) by mouth 2 (two) times daily as needed.  Current Outpatient Medications (Hematological):  .  cyanocobalamin (,VITAMIN B-12,) 1000 MCG/ML injection, Inject 1 mL (1,000 mcg total) into the muscle every 30 (thirty) days.  Current Outpatient Medications (Other):  .  b complex vitamins tablet, Take 1 tablet by mouth daily. .  Cholecalciferol (THERA-D 2000) 50 MCG (2000 UT) TABS, 1 tab by mouth once daily .  Diclofenac Sodium (PENNSAID) 2 % SOLN, Place 2 g 2 (two) times daily onto the skin. Marland Kitchen  glucose blood  (ONE TOUCH ULTRA TEST) test strip, USE ONCE DAILY AS NEEDED .  VITAMIN K PO, Take 1 tablet by mouth daily.   Vitamin D deficiency Last vitamin D Lab Results  Component Value Date   VD25OH 16.65 (L) 02/03/2021  low, to start oral replacement   Followup: Return in about 6 months (around 08/13/2021).  Cathlean Cower, MD 02/12/2021 9:06 PM [facility]

## 2021-02-10 NOTE — Assessment & Plan Note (Signed)
Mild post left knee artroscopy, no falls or giveaays, exam benign, ok to follow with conservative management

## 2021-02-10 NOTE — Patient Instructions (Addendum)
You had the B12 shot today  Please take b12 shots at home monthly  Please continue all other medications as before, including to restart the BP medication with the losartan 100 mg  Please take OTC Vitamin D3 at 2000 units per day, indefinitely.  Please have the pharmacy call with any other refills you may need.  Please continue your efforts at being more active, low cholesterol diet, and weight control.  You are otherwise up to date with prevention measures today.  Please keep your appointments with your specialists as you may have planned  Please make an Appointment to return in 6 months, or sooner if needed, also with Lab Appointment for testing done 3-5 days before at the [facility] (so this is for TWO appointments - please see the scheduling desk as you leave)  Due to the ongoing Covid 19 pandemic, our lab now requires an appointment for any labs done at our office.  If you need labs done and do not have an appointment, please call our office ahead of time to schedule before presenting to the lab for your testing.

## 2021-02-12 ENCOUNTER — Encounter: Payer: Self-pay | Admitting: Internal Medicine

## 2021-02-12 DIAGNOSIS — E559 Vitamin D deficiency, unspecified: Secondary | ICD-10-CM | POA: Insufficient documentation

## 2021-02-12 NOTE — Assessment & Plan Note (Signed)
For B12 1000 mg IM and f/u monthly shots

## 2021-02-12 NOTE — Assessment & Plan Note (Signed)
Lab Results  Component Value Date   HGBA1C 7.1 (H) 02/03/2021   Stable, pt to continue current medical treatment farxiga, glipzide, metformin  Current Outpatient Medications (Endocrine & Metabolic):  .  dapagliflozin propanediol (FARXIGA) 10 MG TABS tablet, Take 1 tablet (10 mg total) by mouth daily. Marland Kitchen  glipiZIDE (GLUCOTROL XL) 5 MG 24 hr tablet, TAKE 1 TABLET BY MOUTH EVERY DAY WITH BREAKFAST .  metFORMIN (GLUCOPHAGE) 500 MG tablet, Take 1 tablet (500 mg total) by mouth 3 (three) times daily.  Current Outpatient Medications (Cardiovascular):  .  amLODipine (NORVASC) 5 MG tablet, TAKE 1 TABLET BY MOUTH EVERY DAY .  atorvastatin (LIPITOR) 40 MG tablet, Take 1 tablet (40 mg total) by mouth daily at 6 PM. .  fenofibrate (TRICOR) 145 MG tablet, Take 1 tablet (145 mg total) by mouth daily. Marland Kitchen  losartan (COZAAR) 100 MG tablet, Take 1 tablet (100 mg total) by mouth daily. .  metoprolol tartrate (LOPRESSOR) 50 MG tablet, Take 1 tablet (50 mg total) by mouth 2 (two) times daily. Marland Kitchen  omega-3 acid ethyl esters (LOVAZA) 1 g capsule, Take 3 capsules (3 g total) by mouth at bedtime.   Current Outpatient Medications (Analgesics):  .  acetaminophen (TYLENOL) 500 MG tablet, Take 1,000 mg by mouth every 6 (six) hours as needed for mild pain. .  naproxen (NAPROSYN) 500 MG tablet, Take 1 tablet (500 mg total) by mouth 2 (two) times daily as needed.  Current Outpatient Medications (Hematological):  .  cyanocobalamin (,VITAMIN B-12,) 1000 MCG/ML injection, Inject 1 mL (1,000 mcg total) into the muscle every 30 (thirty) days.  Current Outpatient Medications (Other):  .  b complex vitamins tablet, Take 1 tablet by mouth daily. .  Cholecalciferol (THERA-D 2000) 50 MCG (2000 UT) TABS, 1 tab by mouth once daily .  Diclofenac Sodium (PENNSAID) 2 % SOLN, Place 2 g 2 (two) times daily onto the skin. Marland Kitchen  glucose blood (ONE TOUCH ULTRA TEST) test strip, USE ONCE DAILY AS NEEDED .  VITAMIN K PO, Take 1 tablet by mouth  daily.

## 2021-02-12 NOTE — Assessment & Plan Note (Signed)
Age and sex appropriate education and counseling updated with regular exercise and diet Referrals for preventative services - none needed except for eye exam soon Immunizations addressed - none needed Smoking counseling  - none needed Evidence for depression or other mood disorder - none significant Most recent labs reviewed. I have personally reviewed and have noted: 1) the patient's medical and social history 2) The patient's current medications and supplements 3) The patient's height, weight, and BMI have been recorded in the chart

## 2021-02-12 NOTE — Assessment & Plan Note (Signed)
Last vitamin D Lab Results  Component Value Date   VD25OH 16.65 (L) 02/03/2021  low, to start oral replacement

## 2021-02-12 NOTE — Assessment & Plan Note (Signed)
Lab Results  Component Value Date   [facility]  07/08/2020     Comment:     . LDL cholesterol not calculated. Triglyceride levels greater than 400 mg/dL invalidate calculated LDL results. . Reference range: <100 . Desirable range <100 mg/dL for primary prevention;   <70 mg/dL for patients with CHD or diabetic patients  with > or = 2 CHD risk factors. Marland Kitchen LDL-C is now calculated using the Martin-Hopkins  calculation, which is a validated novel method providing  better accuracy than the Friedewald equation in the  estimation of LDL-C.  Cresenciano Genre et al. Annamaria Helling. 9476;546(50): 2061-2068  (http://education.[facility].com/faq/FAQ164)    Stable, pt to continue current statin lipitor 10  Current Outpatient Medications (Endocrine & Metabolic):  .  dapagliflozin propanediol (FARXIGA) 10 MG TABS tablet, Take 1 tablet (10 mg total) by mouth daily. Marland Kitchen  glipiZIDE (GLUCOTROL XL) 5 MG 24 hr tablet, TAKE 1 TABLET BY MOUTH EVERY DAY WITH BREAKFAST .  metFORMIN (GLUCOPHAGE) 500 MG tablet, Take 1 tablet (500 mg total) by mouth 3 (three) times daily.  Current Outpatient Medications (Cardiovascular):  .  amLODipine (NORVASC) 5 MG tablet, TAKE 1 TABLET BY MOUTH EVERY DAY .  atorvastatin (LIPITOR) 40 MG tablet, Take 1 tablet (40 mg total) by mouth daily at 6 PM. .  fenofibrate (TRICOR) 145 MG tablet, Take 1 tablet (145 mg total) by mouth daily. Marland Kitchen  losartan (COZAAR) 100 MG tablet, Take 1 tablet (100 mg total) by mouth daily. .  metoprolol tartrate (LOPRESSOR) 50 MG tablet, Take 1 tablet (50 mg total) by mouth 2 (two) times daily. Marland Kitchen  omega-3 acid ethyl esters (LOVAZA) 1 g capsule, Take 3 capsules (3 g total) by mouth at bedtime.   Current Outpatient Medications (Analgesics):  .  acetaminophen (TYLENOL) 500 MG tablet, Take 1,000 mg by mouth every 6 (six) hours as needed for mild pain. .  naproxen (NAPROSYN) 500 MG tablet, Take 1 tablet (500 mg total) by mouth 2 (two) times daily as needed.  Current  Outpatient Medications (Hematological):  .  cyanocobalamin (,VITAMIN B-12,) 1000 MCG/ML injection, Inject 1 mL (1,000 mcg total) into the muscle every 30 (thirty) days.  Current Outpatient Medications (Other):  .  b complex vitamins tablet, Take 1 tablet by mouth daily. .  Cholecalciferol (THERA-D 2000) 50 MCG (2000 UT) TABS, 1 tab by mouth once daily .  Diclofenac Sodium (PENNSAID) 2 % SOLN, Place 2 g 2 (two) times daily onto the skin. Marland Kitchen  glucose blood (ONE TOUCH ULTRA TEST) test strip, USE ONCE DAILY AS NEEDED .  VITAMIN K PO, Take 1 tablet by mouth daily.

## 2021-02-12 NOTE — Assessment & Plan Note (Signed)
To restart losartan, cont to monitor at home and next visit BP Readings from Last 3 Encounters:  02/10/21 (!) 146/90  07/15/20 140/74  01/15/20 (!) 160/90

## 2021-03-05 ENCOUNTER — Other Ambulatory Visit: Payer: Self-pay | Admitting: Internal Medicine

## 2021-03-05 NOTE — Telephone Encounter (Signed)
Please refill as per office routine med refill policy (all routine meds refilled for 3 mo or monthly per pt preference up to one year from last visit, then month to month grace period for 3 mo, then further med refills will have to be denied)  

## 2021-03-10 ENCOUNTER — Other Ambulatory Visit: Payer: Self-pay | Admitting: Internal Medicine

## 2021-03-10 NOTE — Telephone Encounter (Signed)
Please refill as per office routine med refill policy (all routine meds refilled for 3 mo or monthly per pt preference up to one year from last visit, then month to month grace period for 3 mo, then further med refills will have to be denied)  

## 2021-08-11 ENCOUNTER — Other Ambulatory Visit: Payer: 59

## 2021-08-18 ENCOUNTER — Ambulatory Visit: Payer: 59 | Admitting: Internal Medicine

## 2021-09-15 ENCOUNTER — Other Ambulatory Visit (INDEPENDENT_AMBULATORY_CARE_PROVIDER_SITE_OTHER): Payer: 59

## 2021-09-15 ENCOUNTER — Other Ambulatory Visit: Payer: Self-pay

## 2021-09-15 DIAGNOSIS — E1165 Type 2 diabetes mellitus with hyperglycemia: Secondary | ICD-10-CM | POA: Diagnosis not present

## 2021-09-15 DIAGNOSIS — E538 Deficiency of other specified B group vitamins: Secondary | ICD-10-CM

## 2021-09-15 DIAGNOSIS — E559 Vitamin D deficiency, unspecified: Secondary | ICD-10-CM

## 2021-09-15 LAB — HEPATIC FUNCTION PANEL
ALT: 29 U/L (ref 0–53)
AST: 25 U/L (ref 0–37)
Albumin: 4.1 g/dL (ref 3.5–5.2)
Alkaline Phosphatase: 89 U/L (ref 39–117)
Bilirubin, Direct: 0.1 mg/dL (ref 0.0–0.3)
Total Bilirubin: 0.5 mg/dL (ref 0.2–1.2)
Total Protein: 7.1 g/dL (ref 6.0–8.3)

## 2021-09-15 LAB — HEMOGLOBIN A1C: Hgb A1c MFr Bld: 8.5 % — ABNORMAL HIGH (ref 4.6–6.5)

## 2021-09-15 LAB — BASIC METABOLIC PANEL
BUN: 11 mg/dL (ref 6–23)
CO2: 27 mEq/L (ref 19–32)
Calcium: 9.2 mg/dL (ref 8.4–10.5)
Chloride: 103 mEq/L (ref 96–112)
Creatinine, Ser: 0.93 mg/dL (ref 0.40–1.50)
GFR: 90.22 mL/min (ref >60.00)
Glucose, Bld: 150 mg/dL — ABNORMAL HIGH (ref 70–99)
Potassium: 4.6 mEq/L (ref 3.5–5.1)
Sodium: 138 mEq/L (ref 135–145)

## 2021-09-15 LAB — VITAMIN B12: Vitamin B-12: 262 pg/mL (ref 211–911)

## 2021-09-15 LAB — LIPID PANEL
Cholesterol: 179 mg/dL (ref 0–200)
HDL: 21 mg/dL — ABNORMAL LOW (ref >39.00)
Total CHOL/HDL Ratio: 9
Triglycerides: 1479 mg/dL — ABNORMAL HIGH (ref 0.0–149.0)

## 2021-09-15 LAB — VITAMIN D 25 HYDROXY (VIT D DEFICIENCY, FRACTURES): VITD: 19.15 ng/mL — ABNORMAL LOW (ref 30.00–100.00)

## 2021-09-15 LAB — LDL CHOLESTEROL, DIRECT: Direct LDL: 35 mg/dL

## 2021-09-22 ENCOUNTER — Other Ambulatory Visit: Payer: Self-pay

## 2021-09-22 ENCOUNTER — Encounter: Payer: Self-pay | Admitting: Internal Medicine

## 2021-09-22 ENCOUNTER — Ambulatory Visit: Payer: 59 | Admitting: Internal Medicine

## 2021-09-22 VITALS — BP 138/76 | HR 76 | Temp 98.8°F | Ht 73.0 in | Wt 254.0 lb

## 2021-09-22 DIAGNOSIS — E559 Vitamin D deficiency, unspecified: Secondary | ICD-10-CM

## 2021-09-22 DIAGNOSIS — E538 Deficiency of other specified B group vitamins: Secondary | ICD-10-CM | POA: Diagnosis not present

## 2021-09-22 DIAGNOSIS — E1165 Type 2 diabetes mellitus with hyperglycemia: Secondary | ICD-10-CM | POA: Diagnosis not present

## 2021-09-22 DIAGNOSIS — Z0000 Encounter for general adult medical examination without abnormal findings: Secondary | ICD-10-CM

## 2021-09-22 DIAGNOSIS — Z23 Encounter for immunization: Secondary | ICD-10-CM

## 2021-09-22 DIAGNOSIS — I10 Essential (primary) hypertension: Secondary | ICD-10-CM

## 2021-09-22 DIAGNOSIS — E782 Mixed hyperlipidemia: Secondary | ICD-10-CM

## 2021-09-22 NOTE — Progress Notes (Signed)
Patient ID: Kevin Miller, male   DOB: 10-10-1961, 60 y.o.   MRN: [id number]        Chief Complaint: follow up HTN, HLD and hyperglycemia, low vit d and B12       HPI:  Kevin Miller is a 60 y.o. male here overall doing ok, not taking Vit d but is taking B12 shots as po was ineffective; Pt denies chest pain, increased sob or doe, wheezing, orthopnea, PND, increased LE swelling, palpitations, dizziness or syncope.   Pt denies polydipsia, polyuria, or new focal neuro s/s.   Pt denies fever, wt loss, night sweats, loss of appetite, or other constitutional symptoms  Trying to follow low chol DM diet,  Due for Dm eye exam.  Not currently taking statin due to myalgias, and wasn't sure about the fenofibrate so not taking that as well.         Wt Readings from Last 3 Encounters:  09/22/21 254 lb (115.2 kg)  02/10/21 252 lb (114.3 kg)  07/15/20 251 lb (113.9 kg)   BP Readings from Last 3 Encounters:  09/22/21 138/76  02/10/21 (!) 146/90  07/15/20 140/74         Past Medical History:  Diagnosis Date   Clostridium difficile infection 2010   Diabetes mellitus    Elevated LFTs 2011   Fatty liver    Hyperlipidemia    Hypertension    Hypogonadism male 2012   Leg fracture, left    metal rod   Neck pain    Left   Osteopenia 2011   back pain   Pancreatic pseudocyst    Resolved   Pancreatitis 2010   ? etiol   Vitamin B 12 deficiency    Past Surgical History:  Procedure Laterality Date   CHOLECYSTECTOMY     ELBOW SURGERY     right   HERNIA REPAIR     LEG SURGERY     Left fracture   TIBIA FRACTURE SURGERY     left    reports that he has never smoked. He has never used smokeless tobacco. He reports current alcohol use. He reports that he does not use drugs. family history includes Alcohol abuse in his father; COPD in his mother; Colon polyps in his father; Peripheral vascular disease in his mother. Allergies  Allergen Reactions   Gemfibrozil     REACTION: abn LFTs   Current  Outpatient Medications on File Prior to Visit  Medication Sig Dispense Refill   acetaminophen (TYLENOL) 500 MG tablet Take 1,000 mg by mouth every 6 (six) hours as needed for mild pain.     amLODipine (NORVASC) 5 MG tablet TAKE 1 TABLET BY MOUTH EVERY DAY 90 tablet 3   atorvastatin (LIPITOR) 40 MG tablet Take 1 tablet (40 mg total) by mouth daily at 6 PM. 90 tablet 3   b complex vitamins tablet Take 1 tablet by mouth daily.     Cholecalciferol (THERA-D 2000) 50 MCG (2000 UT) TABS 1 tab by mouth once daily 30 tablet 99   cyanocobalamin (,VITAMIN B-12,) 1000 MCG/ML injection Inject 1 mL (1,000 mcg total) into the muscle every 30 (thirty) days. 3 mL 3   dapagliflozin propanediol (FARXIGA) 10 MG TABS tablet Take 1 tablet (10 mg total) by mouth daily. 90 tablet 3   Diclofenac Sodium (PENNSAID) 2 % SOLN Place 2 g 2 (two) times daily onto the skin. 112 g 3   fenofibrate (TRICOR) 145 MG tablet Take 1 tablet (145 mg total) by  mouth daily. 90 tablet 3   glipiZIDE (GLUCOTROL XL) 5 MG 24 hr tablet TAKE 1 TABLET BY MOUTH EVERY DAY WITH BREAKFAST 90 tablet 3   glucose blood (ONE TOUCH ULTRA TEST) test strip USE ONCE DAILY AS NEEDED 50 each 3   losartan (COZAAR) 100 MG tablet Take 1 tablet (100 mg total) by mouth daily. 90 tablet 3   metFORMIN (GLUCOPHAGE) 500 MG tablet Take 1 tablet (500 mg total) by mouth 3 (three) times daily. 270 tablet 3   metoprolol tartrate (LOPRESSOR) 50 MG tablet Take 1 tablet (50 mg total) by mouth 2 (two) times daily. 180 tablet 3   naproxen (NAPROSYN) 500 MG tablet Take 1 tablet (500 mg total) by mouth 2 (two) times daily as needed. 180 tablet 1   omega-3 acid ethyl esters (LOVAZA) 1 g capsule Take 3 capsules (3 g total) by mouth at bedtime. 270 capsule 3   VITAMIN K PO Take 1 tablet by mouth daily.      No current facility-administered medications on file prior to visit.        ROS:  All others reviewed and negative.  Objective        PE:  BP 138/76 (BP Location: Right Arm,  Patient Position: Sitting, Cuff Size: Large)   Pulse 76   Temp 98.8 F (37.1 C) (Oral)   Ht 6\' 1"  (1.854 m)   Wt 254 lb (115.2 kg)   SpO2 98%   BMI 33.51 kg/m                 Constitutional: Pt appears in NAD               HENT: Head: NCAT.                Right Ear: External ear normal.                 Left Ear: External ear normal.                Eyes: . Pupils are equal, round, and reactive to light. Conjunctivae and EOM are normal               Nose: without d/c or deformity               Neck: Neck supple. Gross normal ROM               Cardiovascular: Normal rate and regular rhythm.                 Pulmonary/Chest: Effort normal and breath sounds without rales or wheezing.                Abd:  Soft, NT, ND, + BS, no organomegaly               Neurological: Pt is alert. At baseline orientation, motor grossly intact               Skin: Skin is warm. No rashes, no other new lesions, LE edema - none               Psychiatric: Pt behavior is normal without agitation   Micro: none  Cardiac tracings I have personally interpreted today:  none  Pertinent Radiological findings (summarize): none   Lab Results  Component Value Date   WBC 7.1 02/03/2021   HGB 13.5 02/03/2021   HCT 41.5 02/03/2021   PLT 279.0 02/03/2021   GLUCOSE 150 (H) 09/15/2021  CHOL 179 09/15/2021   TRIG (H) 09/15/2021    1479.0 Triglyceride is over 400; calculations on Lipids are invalid.   HDL 21.00 (L) 09/15/2021   LDLDIRECT 35.0 09/15/2021   [place]  07/08/2020     Comment:     . LDL cholesterol not calculated. Triglyceride levels greater than 400 mg/dL invalidate calculated LDL results. . Reference range: <100 . Desirable range <100 mg/dL for primary prevention;   <70 mg/dL for patients with CHD or diabetic patients  with > or = 2 CHD risk factors. Marland Kitchen LDL-C is now calculated using the Martin-Hopkins  calculation, which is a validated novel method providing  better accuracy than the Friedewald  equation in the  estimation of LDL-C.  Cresenciano Genre et al. Annamaria Helling. 3559;741(63): 2061-2068  (http://education.[facility].com/faq/FAQ164)    ALT 29 09/15/2021   AST 25 09/15/2021   NA 138 09/15/2021   K 4.6 09/15/2021   CL 103 09/15/2021   CREATININE 0.93 09/15/2021   BUN 11 09/15/2021   CO2 27 09/15/2021   TSH 1.83 02/03/2021   PSA 2.12 02/03/2021   HGBA1C 8.5 (H) 09/15/2021   MICROALBUR <0.7 02/03/2021   Assessment/Plan:  COLE EASTRIDGE is a 60 y.o. White or Caucasian [1] male with  has a past medical history of Clostridium difficile infection (2010), Diabetes mellitus, Elevated LFTs (2011), Fatty liver, Hyperlipidemia, Hypertension, Hypogonadism male (2012), Leg fracture, left, Neck pain, Osteopenia (2011), Pancreatic pseudocyst, Pancreatitis (2010), and Vitamin B 12 deficiency.  B12 deficiency Lab Results  Component Value Date   VITAMINB12 262 09/15/2021   Borderline low, to restart IM b12 monthly  Diabetes ([place]) Lab Results  Component Value Date   HGBA1C 8.5 (H) 09/15/2021   uncontrolled, pt to continue current medical treatment glucotrol, metfomrin, and declines increased med tx - prefers to work on diet   Hyperlipidemia Lab Results  Component Value Date   [facility]  07/08/2020     Comment:     . LDL cholesterol not calculated. Triglyceride levels greater than 400 mg/dL invalidate calculated LDL results. . Reference range: <100 . Desirable range <100 mg/dL for primary prevention;   <70 mg/dL for patients with CHD or diabetic patients  with > or = 2 CHD risk factors. Marland Kitchen LDL-C is now calculated using the Martin-Hopkins  calculation, which is a validated novel method providing  better accuracy than the Friedewald equation in the  estimation of LDL-C.  Cresenciano Genre et al. Annamaria Helling. 8453;646(80): 2061-2068  (http://education.[facility].com/faq/FAQ164)    Uncontrolled, pt to continue current statin qod instead of daily, but ok for daily  fenofibrate   Hypertension, uncontrolled BP Readings from Last 3 Encounters:  09/22/21 138/76  02/10/21 (!) 146/90  07/15/20 140/74   Stable, pt to continue medical treatment norvasc, losartan, lopressor    Vitamin D deficiency Last vitamin D Lab Results  Component Value Date   VD25OH 19.15 (L) 09/15/2021   Low, to start oral replacement  Followup: Return in about 6 months (around 03/23/2022).  Cathlean Cower, MD 09/24/2021 9:24 PM [facility]

## 2021-09-22 NOTE — Patient Instructions (Addendum)
You will be contacted regarding the referral for: eye doctor  Your password for Deloris Ping was changed today  [place] to take the lipitor every other day  Please take OTC Vitamin D3 at 2000 units per day, indefinitely, and continue the B12 shots  Please continue all other medications as before, and refills have been done if requested.  Please have the pharmacy call with any other refills you may need.  Please continue your efforts at being more active, low cholesterol diabetic diet, and weight control.  You are otherwise up to date with prevention measures today.  Please keep your appointments with your specialists as you may have planned  Please make an Appointment to return in 6 months, or sooner if needed, also with Lab Appointment for testing done 3-5 days before at the [place] (so this is for TWO appointments - please see the scheduling desk as you leave)  Due to the ongoing Covid 19 pandemic, our lab now requires an appointment for any labs done at our office.  If you need labs done and do not have an appointment, please call our office ahead of time to schedule before presenting to the lab for your testing.

## 2021-09-24 ENCOUNTER — Encounter: Payer: Self-pay | Admitting: Internal Medicine

## 2021-09-24 NOTE — Assessment & Plan Note (Signed)
Lab Results  Component Value Date   [facility]  07/08/2020     Comment:     . LDL cholesterol not calculated. Triglyceride levels greater than 400 mg/dL invalidate calculated LDL results. . Reference range: <100 . Desirable range <100 mg/dL for primary prevention;   <70 mg/dL for patients with CHD or diabetic patients  with > or = 2 CHD risk factors. Marland Kitchen LDL-C is now calculated using the Martin-Hopkins  calculation, which is a validated novel method providing  better accuracy than the Friedewald equation in the  estimation of LDL-C.  Cresenciano Genre et al. Annamaria Helling. 3729;021(11): 2061-2068  (http://education.[facility].com/faq/FAQ164)    Uncontrolled, pt to continue current statin qod instead of daily, but ok for daily fenofibrate

## 2021-09-24 NOTE — Assessment & Plan Note (Signed)
Lab Results  Component Value Date   VITAMINB12 262 09/15/2021   Borderline low, to restart IM b12 monthly

## 2021-09-24 NOTE — Assessment & Plan Note (Signed)
Last vitamin D Lab Results  Component Value Date   VD25OH 19.15 (L) 09/15/2021   Low, to start oral replacement

## 2021-09-24 NOTE — Assessment & Plan Note (Signed)
BP Readings from Last 3 Encounters:  09/22/21 138/76  02/10/21 (!) 146/90  07/15/20 140/74   Stable, pt to continue medical treatment norvasc, losartan, lopressor

## 2021-09-24 NOTE — Addendum Note (Signed)
Addended by: Biagio Borg on: 09/24/2021 09:25 PM   Modules accepted: Orders

## 2021-09-24 NOTE — Assessment & Plan Note (Signed)
Lab Results  Component Value Date   HGBA1C 8.5 (H) 09/15/2021   uncontrolled, pt to continue current medical treatment glucotrol, metfomrin, and declines increased med tx - prefers to work on diet

## 2021-10-16 ENCOUNTER — Other Ambulatory Visit: Payer: Self-pay | Admitting: Internal Medicine

## 2022-01-23 ENCOUNTER — Telehealth: Payer: Self-pay | Admitting: *Deleted

## 2022-01-23 DIAGNOSIS — I10 Essential (primary) hypertension: Secondary | ICD-10-CM

## 2022-01-23 MED ORDER — AMLODIPINE BESYLATE 5 MG PO TABS: ORAL_TABLET | ORAL | 2 refills | Status: DC

## 2022-01-23 MED ORDER — GLIPIZIDE ER 5 MG PO TB24: ORAL_TABLET | ORAL | 2 refills | Status: DC

## 2022-01-23 NOTE — Telephone Encounter (Signed)
Patient prescription for glipizide was sent to his pharmacy

## 2022-01-23 NOTE — Telephone Encounter (Signed)
Patient prescription for Amlodipine was sent to his pharmacy on file

## 2022-02-03 ENCOUNTER — Other Ambulatory Visit: Payer: Self-pay

## 2022-02-03 ENCOUNTER — Emergency Department: Payer: 59

## 2022-02-03 ENCOUNTER — Inpatient Hospital Stay
Admission: EM | Admit: 2022-02-03 | Discharge: 2022-02-12 | DRG: 871 | Disposition: A | Discharge status: Home / Self Care | Payer: 59 | Attending: Student in an Organized Health Care Education/Training Program | Admitting: Student in an Organized Health Care Education/Training Program

## 2022-02-03 DIAGNOSIS — E782 Mixed hyperlipidemia: Secondary | ICD-10-CM | POA: Diagnosis not present

## 2022-02-03 DIAGNOSIS — N179 Acute kidney failure, unspecified: Secondary | ICD-10-CM | POA: Diagnosis present

## 2022-02-03 DIAGNOSIS — E669 Obesity, unspecified: Secondary | ICD-10-CM | POA: Diagnosis present

## 2022-02-03 DIAGNOSIS — K30 Functional dyspepsia: Secondary | ICD-10-CM | POA: Diagnosis not present

## 2022-02-03 DIAGNOSIS — J219 Acute bronchiolitis, unspecified: Secondary | ICD-10-CM | POA: Diagnosis present

## 2022-02-03 DIAGNOSIS — A419 Sepsis, unspecified organism: Principal | ICD-10-CM | POA: Diagnosis present

## 2022-02-03 DIAGNOSIS — Z8249 Family history of ischemic heart disease and other diseases of the circulatory system: Secondary | ICD-10-CM | POA: Diagnosis not present

## 2022-02-03 DIAGNOSIS — K863 Pseudocyst of pancreas: Secondary | ICD-10-CM | POA: Diagnosis present

## 2022-02-03 DIAGNOSIS — Z8371 Family history of colonic polyps: Secondary | ICD-10-CM

## 2022-02-03 DIAGNOSIS — E872 Acidosis, unspecified: Secondary | ICD-10-CM | POA: Diagnosis present

## 2022-02-03 DIAGNOSIS — T383X6A Underdosing of insulin and oral hypoglycemic [antidiabetic] drugs, initial encounter: Secondary | ICD-10-CM | POA: Diagnosis present

## 2022-02-03 DIAGNOSIS — K858 Other acute pancreatitis without necrosis or infection: Secondary | ICD-10-CM | POA: Diagnosis present

## 2022-02-03 DIAGNOSIS — E875 Hyperkalemia: Secondary | ICD-10-CM | POA: Diagnosis present

## 2022-02-03 DIAGNOSIS — R109 Unspecified abdominal pain: Secondary | ICD-10-CM | POA: Diagnosis present

## 2022-02-03 DIAGNOSIS — Z20822 Contact with and (suspected) exposure to covid-19: Secondary | ICD-10-CM | POA: Diagnosis present

## 2022-02-03 DIAGNOSIS — E785 Hyperlipidemia, unspecified: Secondary | ICD-10-CM | POA: Diagnosis present

## 2022-02-03 DIAGNOSIS — E538 Deficiency of other specified B group vitamins: Secondary | ICD-10-CM | POA: Diagnosis present

## 2022-02-03 DIAGNOSIS — Z825 Family history of asthma and other chronic lower respiratory diseases: Secondary | ICD-10-CM

## 2022-02-03 DIAGNOSIS — Z7984 Long term (current) use of oral hypoglycemic drugs: Secondary | ICD-10-CM

## 2022-02-03 DIAGNOSIS — I959 Hypotension, unspecified: Secondary | ICD-10-CM | POA: Diagnosis present

## 2022-02-03 DIAGNOSIS — K2980 Duodenitis without bleeding: Secondary | ICD-10-CM | POA: Diagnosis present

## 2022-02-03 DIAGNOSIS — R6510 Systemic inflammatory response syndrome (SIRS) of non-infectious origin without acute organ dysfunction: Secondary | ICD-10-CM

## 2022-02-03 DIAGNOSIS — J218 Acute bronchiolitis due to other specified organisms: Secondary | ICD-10-CM | POA: Diagnosis not present

## 2022-02-03 DIAGNOSIS — R140 Abdominal distension (gaseous): Secondary | ICD-10-CM

## 2022-02-03 DIAGNOSIS — E860 Dehydration: Secondary | ICD-10-CM | POA: Diagnosis present

## 2022-02-03 DIAGNOSIS — E781 Pure hyperglyceridemia: Secondary | ICD-10-CM | POA: Diagnosis present

## 2022-02-03 DIAGNOSIS — E11649 Type 2 diabetes mellitus with hypoglycemia without coma: Secondary | ICD-10-CM | POA: Diagnosis not present

## 2022-02-03 DIAGNOSIS — R0902 Hypoxemia: Secondary | ICD-10-CM | POA: Diagnosis present

## 2022-02-03 DIAGNOSIS — Z79899 Other long term (current) drug therapy: Secondary | ICD-10-CM

## 2022-02-03 DIAGNOSIS — E871 Hypo-osmolality and hyponatremia: Secondary | ICD-10-CM | POA: Diagnosis present

## 2022-02-03 DIAGNOSIS — K760 Fatty (change of) liver, not elsewhere classified: Secondary | ICD-10-CM | POA: Diagnosis present

## 2022-02-03 DIAGNOSIS — I10 Essential (primary) hypertension: Secondary | ICD-10-CM | POA: Diagnosis present

## 2022-02-03 DIAGNOSIS — E119 Type 2 diabetes mellitus without complications: Secondary | ICD-10-CM

## 2022-02-03 DIAGNOSIS — R6520 Severe sepsis without septic shock: Secondary | ICD-10-CM | POA: Diagnosis present

## 2022-02-03 DIAGNOSIS — R0602 Shortness of breath: Secondary | ICD-10-CM | POA: Diagnosis not present

## 2022-02-03 DIAGNOSIS — K859 Acute pancreatitis without necrosis or infection, unspecified: Secondary | ICD-10-CM | POA: Diagnosis present

## 2022-02-03 DIAGNOSIS — Z6833 Body mass index (BMI) 33.0-33.9, adult: Secondary | ICD-10-CM

## 2022-02-03 DIAGNOSIS — Z9049 Acquired absence of other specified parts of digestive tract: Secondary | ICD-10-CM

## 2022-02-03 DIAGNOSIS — Z91138 Patient's unintentional underdosing of medication regimen for other reason: Secondary | ICD-10-CM

## 2022-02-03 DIAGNOSIS — E7849 Other hyperlipidemia: Secondary | ICD-10-CM | POA: Diagnosis not present

## 2022-02-03 LAB — COMPREHENSIVE METABOLIC PANEL
ALT: 25 U/L (ref 0–44)
AST: 39 U/L (ref 15–41)
Albumin: 3.3 g/dL — ABNORMAL LOW (ref 3.5–5.0)
Alkaline Phosphatase: 67 U/L (ref 38–126)
Anion gap: 20 — ABNORMAL HIGH (ref 5–15)
BUN: 19 mg/dL (ref 6–20)
CO2: 12 mmol/L — ABNORMAL LOW (ref 22–32)
Calcium: 7.2 mg/dL — ABNORMAL LOW (ref 8.9–10.3)
Chloride: 100 mmol/L (ref 98–111)
Creatinine, Ser: 1.43 mg/dL — ABNORMAL HIGH (ref 0.61–1.24)
GFR, Estimated: 56 mL/min — ABNORMAL LOW (ref >60)
Glucose, Bld: 348 mg/dL — ABNORMAL HIGH (ref 70–99)
Potassium: 5 mmol/L (ref 3.5–5.1)
Sodium: 132 mmol/L — ABNORMAL LOW (ref 135–145)
Total Bilirubin: 1.5 mg/dL — ABNORMAL HIGH (ref 0.3–1.2)
Total Protein: 6.3 g/dL — ABNORMAL LOW (ref 6.5–8.1)

## 2022-02-03 LAB — TRIGLYCERIDES: Triglycerides: 4250 mg/dL — ABNORMAL HIGH (ref <150)

## 2022-02-03 LAB — BLOOD GAS, VENOUS
Acid-base deficit: 4.3 mmol/L — ABNORMAL HIGH (ref 0.0–2.0)
Bicarbonate: 21.6 mmol/L (ref 20.0–28.0)
O2 Saturation: 38.2 %
Patient temperature: 37
pCO2, Ven: 42 mmHg — ABNORMAL LOW (ref 44–60)
pH, Ven: 7.32 (ref 7.25–7.43)
pO2, Ven: <31 mmHg — CL (ref 32–45)

## 2022-02-03 LAB — CBC
HCT: 48.5 % (ref 39.0–52.0)
Hemoglobin: 17.2 g/dL — ABNORMAL HIGH (ref 13.0–17.0)
MCH: 30.3 pg (ref 26.0–34.0)
MCHC: 35.5 g/dL (ref 30.0–36.0)
MCV: 85.4 fL (ref 80.0–100.0)
Platelets: 362 10*3/uL (ref 150–400)
RBC: 5.68 MIL/uL (ref 4.22–5.81)
RDW: 15.7 % — ABNORMAL HIGH (ref 11.5–15.5)
WBC: 17.7 10*3/uL — ABNORMAL HIGH (ref 4.0–10.5)
nRBC: 0 % (ref 0.0–0.2)

## 2022-02-03 LAB — BLOOD GAS, ARTERIAL
Acid-base deficit: 4.7 mmol/L — ABNORMAL HIGH (ref 0.0–2.0)
Bicarbonate: 19.5 mmol/L — ABNORMAL LOW (ref 20.0–28.0)
O2 Saturation: 94.2 %
Patient temperature: 38.2
pCO2 arterial: 35 mmHg (ref 32–48)
pH, Arterial: 7.36 (ref 7.35–7.45)
pO2, Arterial: 71 mmHg — ABNORMAL LOW (ref 83–108)

## 2022-02-03 LAB — RESP PANEL BY RT-PCR (FLU A&B, COVID) ARPGX2
Influenza A by PCR: NEGATIVE
Influenza B by PCR: NEGATIVE
SARS Coronavirus 2 by RT PCR: NEGATIVE

## 2022-02-03 LAB — BASIC METABOLIC PANEL
Anion gap: 12 (ref 5–15)
Anion gap: 8 (ref 5–15)
BUN: 22 mg/dL — ABNORMAL HIGH (ref 6–20)
BUN: 22 mg/dL — ABNORMAL HIGH (ref 6–20)
CO2: 16 mmol/L — ABNORMAL LOW (ref 22–32)
CO2: 19 mmol/L — ABNORMAL LOW (ref 22–32)
Calcium: 6.5 mg/dL — ABNORMAL LOW (ref 8.9–10.3)
Calcium: 6.6 mg/dL — ABNORMAL LOW (ref 8.9–10.3)
Chloride: 104 mmol/L (ref 98–111)
Chloride: 106 mmol/L (ref 98–111)
Creatinine, Ser: 1.31 mg/dL — ABNORMAL HIGH (ref 0.61–1.24)
Creatinine, Ser: 1.53 mg/dL — ABNORMAL HIGH (ref 0.61–1.24)
GFR, Estimated: >60 mL/min (ref >60)
GFR, Estimated: 52 mL/min — ABNORMAL LOW (ref >60)
Glucose, Bld: 203 mg/dL — ABNORMAL HIGH (ref 70–99)
Glucose, Bld: 121 mg/dL — ABNORMAL HIGH (ref 70–99)
Potassium: 5.2 mmol/L — ABNORMAL HIGH (ref 3.5–5.1)
Potassium: 5.2 mmol/L — ABNORMAL HIGH (ref 3.5–5.1)
Sodium: 132 mmol/L — ABNORMAL LOW (ref 135–145)
Sodium: 133 mmol/L — ABNORMAL LOW (ref 135–145)

## 2022-02-03 LAB — URINALYSIS, ROUTINE W REFLEX MICROSCOPIC
Bacteria, UA: NONE SEEN
Bilirubin Urine: NEGATIVE
Glucose, UA: >=500 mg/dL — AB
Hgb urine dipstick: NEGATIVE
Ketones, ur: 5 mg/dL — AB
Leukocytes,Ua: NEGATIVE
Nitrite: NEGATIVE
Protein, ur: 30 mg/dL — AB
Specific Gravity, Urine: 1.032 — ABNORMAL HIGH (ref 1.005–1.030)
pH: 5 (ref 5.0–8.0)

## 2022-02-03 LAB — LACTIC ACID, PLASMA
Lactic Acid, Venous: 5 mmol/L (ref 0.5–1.9)
Lactic Acid, Venous: 3.6 mmol/L (ref 0.5–1.9)
Lactic Acid, Venous: 1.6 mmol/L (ref 0.5–1.9)
Lactic Acid, Venous: 1.8 mmol/L (ref 0.5–1.9)

## 2022-02-03 LAB — BRAIN NATRIURETIC PEPTIDE: B Natriuretic Peptide: 23.1 pg/mL (ref 0.0–100.0)

## 2022-02-03 LAB — CBG MONITORING, ED
Glucose-Capillary: 244 mg/dL — ABNORMAL HIGH (ref 70–99)
Glucose-Capillary: 186 mg/dL — ABNORMAL HIGH (ref 70–99)
Glucose-Capillary: 164 mg/dL — ABNORMAL HIGH (ref 70–99)
Glucose-Capillary: 98 mg/dL (ref 70–99)

## 2022-02-03 LAB — TROPONIN I (HIGH SENSITIVITY): Troponin I (High Sensitivity): 15 ng/L (ref <18)

## 2022-02-03 LAB — PROTIME-INR
INR: 1.1 (ref 0.8–1.2)
Prothrombin Time: 14.3 seconds (ref 11.4–15.2)

## 2022-02-03 LAB — ALBUMIN: Albumin: 2.6 g/dL — ABNORMAL LOW (ref 3.5–5.0)

## 2022-02-03 LAB — PROCALCITONIN: Procalcitonin: 8.01 ng/mL

## 2022-02-03 LAB — HIV ANTIBODY (ROUTINE TESTING W REFLEX): HIV Screen 4th Generation wRfx: NONREACTIVE

## 2022-02-03 LAB — LIPASE, BLOOD: Lipase: 637 U/L — ABNORMAL HIGH (ref 11–51)

## 2022-02-03 LAB — PHOSPHORUS: Phosphorus: 2.7 mg/dL (ref 2.5–4.6)

## 2022-02-03 LAB — MAGNESIUM: Magnesium: 1.8 mg/dL (ref 1.7–2.4)

## 2022-02-03 MED ORDER — METRONIDAZOLE 500 MG/100ML IV SOLN
500.0000 mg | Freq: Once | INTRAVENOUS | Status: AC
  Administered 2022-02-03: 500 mg via INTRAVENOUS
  Filled 2022-02-03: qty 100

## 2022-02-03 MED ORDER — SODIUM CHLORIDE 0.9 % IV SOLN
2.0000 g | Freq: Once | INTRAVENOUS | Status: AC
  Administered 2022-02-03: 2 g via INTRAVENOUS
  Filled 2022-02-03: qty 2

## 2022-02-03 MED ORDER — STERILE WATER FOR INJECTION IV SOLN
INTRAMUSCULAR | Status: DC
  Filled 2022-02-03: qty 1000
  Filled 2022-02-03: qty 150
  Filled 2022-02-03 (×3): qty 1000

## 2022-02-03 MED ORDER — CALCIUM GLUCONATE-NACL 1-0.675 GM/50ML-% IV SOLN
1.0000 g | Freq: Once | INTRAVENOUS | Status: AC
  Administered 2022-02-03: 1000 mg via INTRAVENOUS
  Filled 2022-02-03 (×2): qty 50

## 2022-02-03 MED ORDER — SODIUM CHLORIDE 0.9 % IV BOLUS: 500.0000 mL | Freq: Once | INTRAVENOUS | Status: DC

## 2022-02-03 MED ORDER — ATORVASTATIN CALCIUM 20 MG PO TABS
40.0000 mg | ORAL_TABLET | Freq: Every day | ORAL | Status: DC
  Administered 2022-02-03 – 2022-02-05 (×2): 40 mg via ORAL
  Filled 2022-02-03 (×3): qty 2

## 2022-02-03 MED ORDER — SODIUM CHLORIDE 0.9 % IV BOLUS: 1000.0000 mL | Freq: Once | INTRAVENOUS | Status: DC

## 2022-02-03 MED ORDER — FENOFIBRATE 160 MG PO TABS
160.0000 mg | ORAL_TABLET | Freq: Every day | ORAL | Status: DC
  Administered 2022-02-03 – 2022-02-12 (×9): 160 mg via ORAL
  Filled 2022-02-03 (×10): qty 1

## 2022-02-03 MED ORDER — HYDROMORPHONE HCL 1 MG/ML IJ SOLN
1.0000 mg | INTRAMUSCULAR | Status: DC | PRN
  Filled 2022-02-03: qty 1

## 2022-02-03 MED ORDER — INSULIN (MYXREDLIN) INFUSION FOR HYPERTRIGLYCERIDEMIA
0.0500 [IU]/kg/h | INTRAVENOUS | Status: DC
Start: 2022-02-03 — End: 2022-02-05
  Administered 2022-02-03 – 2022-02-04 (×2): 0.1 [IU]/kg/h via INTRAVENOUS
  Administered 2022-02-05: 0.05 [IU]/kg/h via INTRAVENOUS
  Filled 2022-02-03 (×4): qty 100

## 2022-02-03 MED ORDER — ENOXAPARIN SODIUM 40 MG/0.4ML IJ SOSY: 40.0000 mg | PREFILLED_SYRINGE | INTRAMUSCULAR | Status: DC

## 2022-02-03 MED ORDER — ACETAMINOPHEN 325 MG PO TABS
650.0000 mg | ORAL_TABLET | Freq: Four times a day (QID) | ORAL | Status: DC | PRN
Start: 2022-02-03 — End: 2022-02-12
  Administered 2022-02-03 – 2022-02-06 (×4): 650 mg via ORAL
  Filled 2022-02-03 (×4): qty 2

## 2022-02-03 MED ORDER — SODIUM BICARBONATE 8.4 % IV SOLN
50.0000 meq | Freq: Once | INTRAVENOUS | Status: AC
  Administered 2022-02-03: 50 meq via INTRAVENOUS
  Filled 2022-02-03: qty 50

## 2022-02-03 MED ORDER — SODIUM BICARBONATE 8.4 % IV SOLN
INTRAVENOUS | Status: DC
  Filled 2022-02-03 (×2): qty 1000

## 2022-02-03 MED ORDER — ENOXAPARIN SODIUM 60 MG/0.6ML IJ SOSY
0.5000 mg/kg | PREFILLED_SYRINGE | INTRAMUSCULAR | Status: DC
  Administered 2022-02-03 – 2022-02-11 (×9): 57.5 mg via SUBCUTANEOUS
  Filled 2022-02-03 (×5): qty 0.6
  Filled 2022-02-03 (×2): qty 0.57
  Filled 2022-02-03 (×2): qty 0.6
  Filled 2022-02-03: qty 0.57

## 2022-02-03 MED ORDER — VITAMIN D 25 MCG (1000 UNIT) PO TABS
2000.0000 [IU] | ORAL_TABLET | Freq: Every day | ORAL | Status: DC
  Administered 2022-02-03 – 2022-02-12 (×9): 2000 [IU] via ORAL
  Filled 2022-02-03 (×9): qty 2

## 2022-02-03 MED ORDER — PIPERACILLIN-TAZOBACTAM 3.375 G IVPB
3.3750 g | Freq: Three times a day (TID) | INTRAVENOUS | Status: DC
  Administered 2022-02-03 – 2022-02-07 (×10): 3.375 g via INTRAVENOUS
  Filled 2022-02-03 (×11): qty 50

## 2022-02-03 MED ORDER — HYDROMORPHONE HCL 1 MG/ML IJ SOLN
0.5000 mg | Freq: Once | INTRAMUSCULAR | Status: AC
  Administered 2022-02-03: 0.5 mg via INTRAVENOUS
  Filled 2022-02-03: qty 1

## 2022-02-03 MED ORDER — DEXTROSE 5 % IV SOLN
INTRAVENOUS | Status: DC
Start: 2022-02-03 — End: 2022-02-04

## 2022-02-03 MED ORDER — CALCIUM GLUCONATE-NACL 1-0.675 GM/50ML-% IV SOLN
1.0000 g | Freq: Once | INTRAVENOUS | Status: AC
  Administered 2022-02-03: 1000 mg via INTRAVENOUS
  Filled 2022-02-03: qty 50

## 2022-02-03 MED ORDER — ONDANSETRON HCL 4 MG/2ML IJ SOLN
4.0000 mg | Freq: Three times a day (TID) | INTRAMUSCULAR | Status: DC | PRN
  Administered 2022-02-04 – 2022-02-08 (×3): 4 mg via INTRAVENOUS
  Filled 2022-02-03 (×3): qty 2

## 2022-02-03 MED ORDER — ALBUTEROL SULFATE (2.5 MG/3ML) 0.083% IN NEBU
3.0000 mL | INHALATION_SOLUTION | RESPIRATORY_TRACT | Status: DC | PRN
  Administered 2022-02-07 (×2): 3 mL via RESPIRATORY_TRACT
  Filled 2022-02-03 (×2): qty 3

## 2022-02-03 MED ORDER — INSULIN ASPART 100 UNIT/ML IJ SOLN: 0.0000 [IU] | Freq: Three times a day (TID) | INTRAMUSCULAR | Status: DC

## 2022-02-03 MED ORDER — PANTOPRAZOLE 80MG IVPB - SIMPLE MED
80.0000 mg | Freq: Once | INTRAVENOUS | Status: AC
  Administered 2022-02-03: 80 mg via INTRAVENOUS
  Filled 2022-02-03 (×2): qty 100

## 2022-02-03 MED ORDER — DM-GUAIFENESIN ER 30-600 MG PO TB12
1.0000 | ORAL_TABLET | Freq: Two times a day (BID) | ORAL | Status: DC | PRN
  Administered 2022-02-05 – 2022-02-08 (×4): 1 via ORAL
  Filled 2022-02-03 (×4): qty 1

## 2022-02-03 MED ORDER — SODIUM CHLORIDE 0.9 % IV SOLN: INTRAVENOUS | Status: DC

## 2022-02-03 MED ORDER — SODIUM CHLORIDE 0.9 % IV BOLUS
1000.0000 mL | Freq: Once | INTRAVENOUS | Status: AC
  Administered 2022-02-03: 1000 mL via INTRAVENOUS

## 2022-02-03 MED ORDER — HYDRALAZINE HCL 20 MG/ML IJ SOLN
5.0000 mg | INTRAMUSCULAR | Status: DC | PRN
  Administered 2022-02-08: 5 mg via INTRAVENOUS
  Filled 2022-02-03: qty 1

## 2022-02-03 MED ORDER — INSULIN GLARGINE-YFGN 100 UNIT/ML ~~LOC~~ SOLN
8.0000 [IU] | Freq: Every day | SUBCUTANEOUS | Status: DC
  Administered 2022-02-03: 8 [IU] via SUBCUTANEOUS
  Filled 2022-02-03 (×2): qty 0.08

## 2022-02-03 MED ORDER — OXYCODONE-ACETAMINOPHEN 5-325 MG PO TABS
1.0000 | ORAL_TABLET | ORAL | Status: DC | PRN
  Administered 2022-02-03: 1 via ORAL
  Filled 2022-02-03: qty 1

## 2022-02-03 MED ORDER — ONDANSETRON HCL 4 MG/2ML IJ SOLN
4.0000 mg | Freq: Once | INTRAMUSCULAR | Status: AC
  Administered 2022-02-03: 4 mg via INTRAVENOUS
  Filled 2022-02-03: qty 2

## 2022-02-03 MED ORDER — LACTATED RINGERS IV BOLUS (SEPSIS)
2000.0000 mL | Freq: Once | INTRAVENOUS | Status: AC
  Administered 2022-02-03: 2000 mL via INTRAVENOUS

## 2022-02-03 MED ORDER — INSULIN ASPART 100 UNIT/ML IJ SOLN: 0.0000 [IU] | Freq: Every day | INTRAMUSCULAR | Status: DC

## 2022-02-03 MED ORDER — SODIUM CHLORIDE 0.9 % IV SOLN
500.0000 mg | INTRAVENOUS | Status: DC
  Administered 2022-02-03 – 2022-02-06 (×3): 500 mg via INTRAVENOUS
  Filled 2022-02-03: qty 500
  Filled 2022-02-03: qty 5
  Filled 2022-02-03: qty 500
  Filled 2022-02-03: qty 5

## 2022-02-03 MED ORDER — MAGNESIUM SULFATE 2 GM/50ML IV SOLN
2.0000 g | Freq: Once | INTRAVENOUS | Status: AC
  Administered 2022-02-03: 2 g via INTRAVENOUS
  Filled 2022-02-03: qty 50

## 2022-02-03 MED ORDER — SODIUM CHLORIDE 0.9 % IV BOLUS
1000.0000 mL | Freq: Once | INTRAVENOUS | Status: AC
Start: 2022-02-03 — End: 2022-02-03
  Administered 2022-02-03: 1000 mL via INTRAVENOUS

## 2022-02-03 NOTE — ED Notes (Signed)
Insulin and D5 infusions moved to 22G IV in R hand.

## 2022-02-03 NOTE — Progress Notes (Signed)
PHARMACY -  BRIEF ANTIBIOTIC NOTE   Pharmacy has received consult(s) for cefepime from an ED provider.  The patient's profile has been reviewed for ht/wt/allergies/indication/available labs.    One time order(s) placed for 2 grams IV cefepime x 1  Further antibiotics/pharmacy consults should be ordered by admitting physician if indicated.                       Thank you, Dallie Piles 02/03/2022  11:48 AM

## 2022-02-03 NOTE — ED Triage Notes (Signed)
Pt with abdominal pain and distention since yesterday morning. Pt denies N/V/D but states other family members have a stomach bug. Pt denies hx heart issues. Pt states pain is mostly left side of abdomen. Pt states he has hx of pancreatitis. Pt states swelling in abdomen makes him feel like he can't take a deep breath. Pt denies other swelling.

## 2022-02-03 NOTE — Consult Note (Signed)
Pharmacy Antibiotic Note  Kevin Miller is a 61 y.o. male admitted on 02/03/2022 with sepsis 2/2 ?PNA or pancreatitis.  Pharmacy has been consulted for Zosyn dosing.  2/25 -CT imaging shows no acute/discrete peripancreatic fluid collection or abscess to indicate necrosis and 2/25 CXR shows developing bronchopneumonia. Recommend procalcitonin level to help steer abx decision since afebrile and leukocytosis may be reactive to pancreatitis inflammation.  Plan: Will initiate Zosyn 3.375g IV q8h (4 hour infusion). 1st dose starting on 2/25 @2100  based on last Cefepime and Metronidazole x1 doses recieved.  Height: 6' (182.9 cm) Weight: 113.4 kg (250 lb) IBW/kg (Calculated) : 77.6  Temp (24hrs), Avg:98.2 F (36.8 C), Min:98.2 F (36.8 C), Max:98.2 F (36.8 C)  Recent Labs  Lab 02/03/22 1109 02/03/22 1216 02/03/22 1447  WBC 17.7*  --   --   CREATININE 1.43*  --   --   LATICACIDVEN  --  5.0* 3.6*    Estimated Creatinine Clearance: 72.3 mL/min (A) (by C-G formula based on SCr of 1.43 mg/dL (H)).    Allergies  Allergen Reactions   Gemfibrozil     REACTION: abn LFTs    Antimicrobials this admission: CFP/MTZ x1 (2/25) Zosyn (2/25 >>   Dose adjustments this admission: CTM and adjust PRN  Microbiology results: 2/25 BCx: sent 2/25 Flu/Cov: neg/neg   Thank you for allowing pharmacy to be a part of this patients care.  Lorna Dibble 02/03/2022 5:28 PM

## 2022-02-03 NOTE — Consult Note (Signed)
PHARMACY CONSULT NOTE - FOLLOW UP  Pharmacy Consult for Electrolyte Monitoring and Replacement   Recent Labs: Potassium (mmol/L)  Date Value  02/03/2022 5.2 (H)   Magnesium (mg/dL)  Date Value  02/03/2022 1.8   Calcium (mg/dL)  Date Value  02/03/2022 6.5 (L)   Albumin (g/dL)  Date Value  02/03/2022 2.6 (L)  07/26/2017 4.3   Phosphorus (mg/dL)  Date Value  02/03/2022 2.7   Sodium (mmol/L)  Date Value  02/03/2022 132 (L)  07/26/2017 138    Assessment: Patient admitted with pancreatitis, pancreatic pseudocyst, and sepsis. TG ar 4250 on admission and patient was p[placed on insulin gtt. Pharmacy was consulted to manage electrolytes.  Current hyperkalemia persist and primary provided following. Mg and phos remain within normal range. Calcium remains below goal with corrected Ca at 7.94 after 1gm of calcium gluconate already given today.  Goal of Therapy:  Electrolytes within  normal limits  Plan:  Replace Ca with additional 1gm of IV calcium gluconate Follow up morning labs and replace as needed.  Gigi Onstad Rodriguez-Guzman PharmD, BCPS 02/03/2022 9:56 PM

## 2022-02-03 NOTE — Progress Notes (Signed)
PHARMACIST - PHYSICIAN COMMUNICATION  CONCERNING:  Enoxaparin (Lovenox) for DVT Prophylaxis    RECOMMENDATION: Patient was prescribed enoxaprin 40mg  q24 hours for VTE prophylaxis.   Filed Weights   02/03/22 1107  Weight: 113.4 kg (250 lb)    Body mass index is 33.91 kg/m.  Estimated Creatinine Clearance: 72.3 mL/min (A) (by C-G formula based on SCr of 1.43 mg/dL (H)).   Based on [place] patient is candidate for enoxaparin 0.5mg /kg TBW SQ every 24 hours based on BMI being >30.  DESCRIPTION: Pharmacy has adjusted enoxaparin dose per [facility] policy.  Patient is now receiving enoxaparin 57.5 mg every 24 hours    Sherilyn Banker, PharmD Clinical Pharmacist  02/03/2022 2:53 PM

## 2022-02-03 NOTE — ED Notes (Signed)
Dr. Blaine Hamper notified of pt's V/S. New order received.

## 2022-02-03 NOTE — H&P (Addendum)
History and Physical    WOODWARD KLEM JKD:[id number] DOB: 07-Feb-1961 DOA: 02/03/2022  Referring MD/NP/PA:   PCP: Biagio Borg, MD   Patient coming from:  The patient is coming from home.  At baseline, pt is independent for most of ADL.        Chief Complaint: abdominal pain  HPI: Kevin Miller is a 61 y.o. male with medical history significant of pancreatitis, pancreatic pseudocyst, hyperlipidemia, hypertriglyceridemia, hypertension, diabetes mellitus, C. difficile, hypogonadism, who presents with abdominal pain.  His abdomen is started yesterday, which is diffuse, constant, sharp, severe, nonradiating.  Denies nausea, vomiting, diarrhea.  Patient stated that he had subjective fever at home.  Body temperature is 98.2 in ED. Patient states that he has mild cough with little mucus production which he attributes to postnasal drip, no chest pain, shortness of breath.  No symptoms of UTI. Patient does not abuse alcohol.  Addendum: Initially patient had soft blood pressure 93/65, patient has been given IV fluid.  Totally 3 L of IV fluid was given.  Blood pressure dropped to upper 80s.   Data Reviewed and ED Course: pt was found to have lipase 637, troponin level 15, lactic acid 5.0, negative COVID PCR, AST and ALT normal, total bilirubin 1.5, BNP 23, AKI with creatinine 1.42, BUN 19, GFR 56 (baseline creatinine 0.93 on 09/15/2021), WBC 17.7, temperature normal, soft blood pressure 93/65, heart rate 133, RR 30, oxygen saturation 92-97% on room air.  Patient is admitted to telemetry bed as inpatient. Dr. Patsey Berthold of ICU is consulted.  CT abdomen/pelvis showed: 1. Findings consistent with acute pancreatitis, with significant peripancreatic inflammatory change and a trace amount of ascites. No discrete acute peripancreatic fluid collection. 2. Wall thickening and inflammation of the duodenum, which is most likely reactive. Consider duodenitis if there are in consistent clinical findings for  pancreatitis. 3. No other acute abnormality within the abdomen or pelvis. 4. Mild hepatomegaly with extensive hepatic steatosis.  CXR: The appearance the chest is concerning for bronchitis and developing multilobar bilateral bronchopneumonia.  EKG: I have personally reviewed.  Sinus rhythm, QTc 459, T wave inversion in inferior leads, poor R wave progression   Review of Systems:   General: no fevers, chills, no body weight gain, has poor appetite, has fatigue HEENT: no blurry vision, hearing changes or sore throat Respiratory: no dyspnea, has coughing, no wheezing CV: no chest pain, no palpitations GI: no nausea, vomiting, has abdominal pain, no diarrhea, constipation GU: no dysuria, burning on urination, increased urinary frequency, hematuria  Ext: no leg edema Neuro: no unilateral weakness, numbness, or tingling, no vision change or hearing loss Skin: no rash, no skin tear. MSK: No muscle spasm, no deformity, no limitation of range of movement in spin Heme: No easy bruising.  Travel history: No recent long distant travel.   Allergy:  Allergies  Allergen Reactions   Gemfibrozil     REACTION: abn LFTs    Past Medical History:  Diagnosis Date   Clostridium difficile infection 2010   Diabetes mellitus    Elevated LFTs 2011   Fatty liver    Hyperlipidemia    Hypertension    Hypogonadism male 2012   Leg fracture, left    metal rod   Neck pain    Left   Osteopenia 2011   back pain   Pancreatic pseudocyst    Resolved   Pancreatitis 2010   ? etiol   Vitamin B 12 deficiency     Past Surgical History:  Procedure Laterality Date   CHOLECYSTECTOMY     ELBOW SURGERY     right   HERNIA REPAIR     LEG SURGERY     Left fracture   TIBIA FRACTURE SURGERY     left    Social History:  reports that he has never smoked. He has never used smokeless tobacco. He reports current alcohol use. He reports that he does not use drugs.  Family History:  Family History  Problem  Relation Age of Onset   COPD Mother    Peripheral vascular disease Mother    Alcohol abuse Father    Colon polyps Father    Colon cancer Neg Hx      Prior to Admission medications   Medication Sig Start Date End Date Taking? Authorizing Provider  acetaminophen (TYLENOL) 500 MG tablet Take 1,000 mg by mouth every 6 (six) hours as needed for mild pain.    [provider]  amLODipine (NORVASC) 5 MG tablet TAKE 1 TABLET BY MOUTH EVERY DAY 01/23/22   Biagio Borg, MD  atorvastatin (LIPITOR) 40 MG tablet Take 1 tablet (40 mg total) by mouth daily at 6 PM. 02/10/21   Biagio Borg, MD  b complex vitamins tablet Take 1 tablet by mouth daily.    [provider]  Cholecalciferol (THERA-D 2000) 50 MCG (2000 UT) TABS 1 tab by mouth once daily 02/10/21   Biagio Borg, MD  cyanocobalamin (,VITAMIN B-12,) 1000 MCG/ML injection Inject 1 mL (1,000 mcg total) into the muscle every 30 (thirty) days. 02/10/21   Biagio Borg, MD  dapagliflozin propanediol (FARXIGA) 10 MG TABS tablet Take 1 tablet (10 mg total) by mouth daily. 02/10/21   Biagio Borg, MD  Diclofenac Sodium (PENNSAID) 2 % SOLN Place 2 g 2 (two) times daily onto the skin. 10/23/17   Lyndal Pulley, DO  fenofibrate (TRICOR) 145 MG tablet Take 1 tablet (145 mg total) by mouth daily. 02/10/21   Biagio Borg, MD  glipiZIDE (GLUCOTROL XL) 5 MG 24 hr tablet TAKE 1 TABLET BY MOUTH EVERY DAY WITH BREAKFAST 01/23/22   Biagio Borg, MD  glucose blood (ONE TOUCH ULTRA TEST) test strip USE ONCE DAILY AS NEEDED 09/16/14   Gold[place] Circle, FNP  losartan (COZAAR) 100 MG tablet Take 1 tablet (100 mg total) by mouth daily. 02/10/21   Biagio Borg, MD  metFORMIN (GLUCOPHAGE) 500 MG tablet Take 1 tablet (500 mg total) by mouth 3 (three) times daily. 02/10/21   Biagio Borg, MD  metoprolol tartrate (LOPRESSOR) 50 MG tablet Take 1 tablet (50 mg total) by mouth 2 (two) times daily. 02/10/21   Biagio Borg, MD  naproxen (NAPROSYN) 500 MG tablet TAKE 1 TABLET  BY MOUTH TWICE A DAY AS NEEDED 10/16/21   Biagio Borg, MD  omega-3 acid ethyl esters (LOVAZA) 1 g capsule Take 3 capsules (3 g total) by mouth at bedtime. 02/10/21   Biagio Borg, MD  VITAMIN K PO Take 1 tablet by mouth daily.     [provider]    Physical Exam: Vitals:   02/03/22 1715 02/03/22 1745 02/03/22 1815 02/03/22 1830  BP: (!) 101/59 103/62 91/64 112/72  Pulse: (!) 104 (!) 107 (!) 110 (!) 113  Resp: (!) 24 (!) 25 (!) 27 (!) 23  Temp:    (!) 100.8 F (38.2 C)  TempSrc:    Oral  SpO2: 93% 92% 90% 95%  Weight:  Height:       General: Not in acute distress HEENT:       Eyes: PERRL, EOMI, no scleral icterus.       ENT: No discharge from the ears and nose, no pharynx injection, no tonsillar enlargement.        Neck: No JVD, no bruit, no mass felt. Heme: No neck lymph node enlargement. Cardiac: S1/S2, RRR, No murmurs, No gallops or rubs. Respiratory: No rales, wheezing, rhonchi or rubs. GI: Soft, nondistended, has diffuse tenderness, no rebound pain, no organomegaly, BS present. GU: No hematuria Ext: No pitting leg edema bilaterally. 1+DP/PT pulse bilaterally. Musculoskeletal: No joint deformities, No joint redness or warmth, no limitation of ROM in spin. Skin: No rashes.  Neuro: Alert, oriented X3, cranial nerves II-XII grossly intact, moves all extremities normally.  Psych: Patient is not psychotic, no suicidal or hemocidal ideation.  Labs on Admission: I have personally reviewed following labs and imaging studies  CBC: Recent Labs  Lab 02/03/22 1109  WBC 17.7*  HGB 17.2*  HCT 48.5  MCV 85.4  PLT 947   Basic Metabolic Panel: Recent Labs  Lab 02/03/22 1109  NA 132*  K 5.0  CL 100  CO2 12*  GLUCOSE 348*  BUN 19  CREATININE 1.43*  CALCIUM 7.2*   GFR: Estimated Creatinine Clearance: 72.3 mL/min (A) (by C-G formula based on SCr of 1.43 mg/dL (H)). Liver Function Tests: Recent Labs  Lab 02/03/22 1109  AST 39  ALT 25  ALKPHOS 67  BILITOT  1.5*  PROT 6.3*  ALBUMIN 3.3*   Recent Labs  Lab 02/03/22 1109  LIPASE 637*   No results for input(s): AMMONIA in the last 168 hours. Coagulation Profile: Recent Labs  Lab 02/03/22 1216  INR 1.1   Cardiac Enzymes: No results for input(s): CKTOTAL, CKMB, CKMBINDEX, TROPONINI in the last 168 hours. BNP (last 3 results) No results for input(s): PROBNP in the last 8760 hours. HbA1C: No results for input(s): HGBA1C in the last 72 hours. CBG: Recent Labs  Lab 02/03/22 1658  GLUCAP 244*   Lipid Profile: Recent Labs    02/03/22 1642  TRIG 4,250*   Thyroid Function Tests: No results for input(s): TSH, T4TOTAL, FREET4, T3FREE, THYROIDAB in the last 72 hours. Anemia Panel: No results for input(s): VITAMINB12, FOLATE, FERRITIN, TIBC, IRON, RETICCTPCT in the last 72 hours. Urine analysis:    Component Value Date/Time   COLORURINE YELLOW (A) 02/03/2022 1824   APPEARANCEUR CLEAR (A) 02/03/2022 1824   LABSPEC 1.032 (H) 02/03/2022 1824   PHURINE 5.0 02/03/2022 1824   GLUCOSEU >=500 (A) 02/03/2022 1824   GLUCOSEU >=1000 (A) 02/03/2021 0746   HGBUR NEGATIVE 02/03/2022 1824   BILIRUBINUR NEGATIVE 02/03/2022 1824   KETONESUR 5 (A) 02/03/2022 1824   PROTEINUR 30 (A) 02/03/2022 1824   UROBILINOGEN 0.2 02/03/2021 0746   NITRITE NEGATIVE 02/03/2022 1824   LEUKOCYTESUR NEGATIVE 02/03/2022 1824   Sepsis Labs: @LABRCNTIP (procalcitonin:4,lacticidven:4) ) Recent Results (from the past 240 hour(s))  Resp Panel by RT-PCR (Flu A&B, Covid) Nasopharyngeal Swab     Status: None   Collection Time: 02/03/22 12:16 PM   Specimen: Nasopharyngeal Swab; Nasopharyngeal(NP) swabs in vial transport medium  Result Value Ref Range Status   SARS Coronavirus 2 by RT PCR NEGATIVE NEGATIVE Final    Comment: (NOTE) SARS-CoV-2 target nucleic acids are NOT DETECTED.  The SARS-CoV-2 RNA is generally detectable in upper respiratory specimens during the acute phase of infection. The lowest concentration  of SARS-CoV-2 viral copies this assay  can detect is 138 copies/mL. A negative result does not preclude SARS-Cov-2 infection and should not be used as the sole basis for treatment or other patient management decisions. A negative result may occur with  improper specimen collection/handling, submission of specimen other than nasopharyngeal swab, presence of viral mutation(s) within the areas targeted by this assay, and inadequate number of viral copies(<138 copies/mL). A negative result must be combined with clinical observations, patient history, and epidemiological information. The expected result is Negative.  Fact Sheet for Patients:  EntrepreneurPulse.com.au  Fact Sheet for Healthcare Providers:  IncredibleEmployment.be  This test is no t yet approved or cleared by the [place] FDA and  has been authorized for detection and/or diagnosis of SARS-CoV-2 by FDA under an Emergency Use Authorization (EUA). This EUA will remain  in effect (meaning this test can be used) for the duration of the COVID-19 declaration under Section 564(b)(1) of the Act, 21 U.S.C.section 360bbb-3(b)(1), unless the authorization is terminated  or revoked sooner.       Influenza A by PCR NEGATIVE NEGATIVE Final   Influenza B by PCR NEGATIVE NEGATIVE Final    Comment: (NOTE) The Xpert Xpress SARS-CoV-2/FLU/RSV plus assay is intended as an aid in the diagnosis of influenza from Nasopharyngeal swab specimens and should not be used as a sole basis for treatment. Nasal washings and aspirates are unacceptable for Xpert Xpress SARS-CoV-2/FLU/RSV testing.  Fact Sheet for Patients: EntrepreneurPulse.com.au  Fact Sheet for Healthcare Providers: IncredibleEmployment.be  This test is not yet approved or cleared by the [place] FDA and has been authorized for detection and/or diagnosis of SARS-CoV-2 by FDA under an Emergency Use  Authorization (EUA). This EUA will remain in effect (meaning this test can be used) for the duration of the COVID-19 declaration under Section 564(b)(1) of the Act, 21 U.S.C. section 360bbb-3(b)(1), unless the authorization is terminated or revoked.  Performed at [facility], [address] [place], [place] [address]      Radiological Exams on Admission: CT ABDOMEN PELVIS WO CONTRAST  Result Date: 02/03/2022 CLINICAL DATA:  Abdominal pain and distension since yesterday. EXAM: CT ABDOMEN AND PELVIS WITHOUT CONTRAST TECHNIQUE: Multidetector CT imaging of the abdomen and pelvis was performed following the standard protocol without IV contrast. RADIATION DOSE REDUCTION: This exam was performed according to the departmental dose-optimization program which includes automated exposure control, adjustment of the mA and/or kV according to patient size and/or use of iterative reconstruction technique. COMPARISON:  01/13/2010. FINDINGS: Lower chest: No acute abnormality. Hepatobiliary: Liver mildly enlarged and diffusely decreased in attenuation. No liver mass or focal lesion. Gallbladder surgically absent. No bile duct dilation. Pancreas: Peripancreatic inflammation that tracks along the gastrohepatic and gastro duodenal ligaments and anterior pararenal fascia, right greater than left. Trace amount of associated ascites. No discrete fluid collection. Spleen: Normal in size without focal abnormality. Adrenals/Urinary Tract: Adrenal glands are unremarkable. Kidneys are normal, without renal calculi, focal lesion, or hydronephrosis. Bladder is unremarkable. Stomach/Bowel: Stomach distended, otherwise unremarkable. Duodenal wall is thickened with adjacent inflammation, that is most likely reactive to adjacent pancreatic inflammation. Small bowel and colon are normal in caliber. No wall thickening. No other areas of inflammation. Normal appendix visualized. Vascular/Lymphatic: No vascular abnormality. No  enlarged lymph nodes. Reproductive: Unremarkable. Other: None. Musculoskeletal: No fracture or acute finding.  No bone lesion. IMPRESSION: 1. Findings consistent with acute pancreatitis, with significant peripancreatic inflammatory change and a trace amount of ascites. No discrete acute peripancreatic fluid collection. 2. Wall thickening and inflammation of the  duodenum, which is most likely reactive. Consider duodenitis if there are in consistent clinical findings for pancreatitis. 3. No other acute abnormality within the abdomen or pelvis. 4. Mild hepatomegaly with extensive hepatic steatosis. Electronically Signed   By: Lajean Manes M.D.   On: 02/03/2022 13:23   DG Chest 2 View  Result Date: 02/03/2022 CLINICAL DATA:  61 year old male with history of shortness of breath. EXAM: CHEST - 2 VIEW COMPARISON:  Chest x-ray 02/17/2010. FINDINGS: Lung volumes are low. Patchy ill-defined opacities and areas of interstitial prominence are noted in the mid to lower lungs bilaterally where there is also diffuse peribronchial cuffing. No pleural effusions. No pneumothorax. No evidence of pulmonary edema. Heart size is normal. Upper mediastinal contours are within normal limits. IMPRESSION: 1. The appearance the chest is concerning for bronchitis and developing multilobar bilateral bronchopneumonia. Electronically Signed   By: Vinnie Langton M.D.   On: 02/03/2022 11:55      Assessment/Plan Principal Problem:   Acute recurrent pancreatitis Active Problems:   Hyperlipidemia   Hypertriglyceridemia   Severe sepsis (HCC)   Diabetes mellitus without complication (HCC)   HTN (hypertension)   AKI (acute kidney injury) (HCC)   Lactic acidosis   Acute bronchiolitis   Acute recurrent pancreatitis: Lipase 637. CT showed acute pancreatitis, with significant peripancreatic inflammatory change and a trace amount of ascites, but no discrete acute peripancreatic fluid collection.  Patient has history of  hypertriglyceridemia.   -Admitted to stepdown as inpatient -N.p.o. -Pain control: As needed Dilaudid, Percocet, Tylenol -IV fluid: Total of 4L of IVF, and 150 cc/h of NS -Pending triglyceride level now.  If triglyceride level is significantly elevated, will start insulin drip  Addendum: Hypertriglyceridemia.  TG level level came back 4252. Dr. Patsey Berthold recommended starting insulin drip.  Patient has severe sepsis and hypotension, not stable to consider transfer. -Insulin drip was started per triglyceridemia protocol per pharm consultation -Electrolytes monitoring by pharmacist  Severe sepsis due to possible acute bronchitis: pt has severe sepsis with WBC 17.1, tachycardia with heart rate up to 133, RR 30.  Lactic acid 5.0.  Initial blood pressure soft, but then SBP dropped to upper 80s.  Chest x-ray showed appearance which is concerning for bronchitis and developing multilobar bilateral bronchopneumonia. Consulted to Dr. Patsey Berthold of PCCM  -IV Zosyn and azithromycin empirically (patient received 1 dose of cefepime and Flagyl) -f/u blood culture -will get Procalcitonin and trend lactic acid levels  -IVF: as above -Follow-up Dr. Domingo Dimes recommendation -f/u urine antigen of Legionella and strep  Hyperlipidemia -Continue Lipitor and Tricor  Diabetes mellitus without complication [facility]): Patient is taking glipizide and Farxiga at home.  Blood sugar 348.  Anion gap 21 which is likely due to lactic acidosis rather than DKA -Start glargine insulin 8 units daily -Sliding scale insulin  HTN (hypertension) -Hold home medications, including Cozaar, amlodipine, metoprolol  AKI (acute kidney injury) ([place]): Likely due to dehydration -IV fluid as above, -Cozaar is on hold  Lactic acidosis: Lactic acid 5.0 -IV fluid as above -Trend lactic acid level     DVT ppx: SQ Lovenox  Code Status: Full code  Family Communication:   Yes, patient's sister and girlfriend   at bed side.        Disposition Plan:  Anticipate discharge back to previous environment  Consults called: Dr. Patsey Berthold of ICU is consulted  Admission status and Level of care: Stepdown:  as inpt    Severity of Illness:  The appropriate patient status for this patient is INPATIENT.  Inpatient status is judged to be reasonable and necessary in order to provide the required intensity of service to ensure the patient's safety. The patient's presenting symptoms, physical exam findings, and initial radiographic and laboratory data in the context of their chronic comorbidities is felt to place them at high risk for further clinical deterioration. Furthermore, it is not anticipated that the patient will be medically stable for discharge from the hospital within 2 midnights of admission.   * I certify that at the point of admission it is my clinical judgment that the patient will require inpatient hospital care spanning beyond 2 midnights from the point of admission due to high intensity of service, high risk for further deterioration and high frequency of surveillance required.*       Date of Service 02/03/2022    Ivor Costa [facility]   If 7PM-7AM, please contact night-coverage [email or web address] 02/03/2022, 6:49 PM

## 2022-02-03 NOTE — Consult Note (Addendum)
NAME:  Kevin Miller, MRN:  [id number], DOB:  1961/02/04, LOS: 0 ADMISSION DATE:  02/03/2022, CONSULTATION DATE:  02/03/2022 REFERRING MD:  Dr. Blaine Hamper, CHIEF COMPLAINT: Abdominal pain and distention  Brief Pt Description / Synopsis:  61 year old male admitted with severe sepsis due to severe acute pancreatitis in the setting of hypertriglyceridemia, acute kidney injury with anion gap metabolic acidosis.  Concern for possible pneumonia.  History of Present Illness:  Kevin Miller is a 61 year old male with a past medical history significant for pancreatitis, pancreatic pseudocyst, hypertriglyceridemia, hyperlipidemia, hypertension, diabetes mellitus, C. difficile infection, hepatic steatosis, and hypogonadism who presented to [facility] ED on 02/03/2022 due to complaints of abdominal pain and abdominal distention.  Patient reports progressive abdominal pain and distention since yesterday, describing the pain as diffuse, constant, sharp, severe, non-radiating.  He denies nausea, vomiting, diarrhea, chest pain, shortness of breath, dysuria.  He also reports subjective fever, mild cough with little mucus production which he attributes to postnasal drip.  The patient denies any alcohol yse or recent changes in medications.  ED Course: Initial vital signs: Temperature 98.2 F orally, respiratory rate 22, pulse 133, blood pressure 98/58, SPO2 94% on room air Significant Labs: Sodium 132, bicarbonate 12, glucose 348, BUN 19, creatinine 1.43, calcium 7.2, anion gap 20, albumin 3.3, lipase 637, AST 39, ALT 25, total bilirubin 1.5, lactic acid 5.0, WBC 17.7, procalcitonin 8.01 COVID and influenza PCR is negative Respiratory viral panel is pending Imaging: Chest x-ray>>IMPRESSION: 1. The appearance the chest is concerning for bronchitis and developing multilobar bilateral bronchopneumonia. CT abdomen and pelvis>>IMPRESSION: 1. Findings consistent with acute pancreatitis, with significant peripancreatic  inflammatory change and a trace amount of ascites. No discrete acute peripancreatic fluid collection. 2. Wall thickening and inflammation of the duodenum, which is most likely reactive. Consider duodenitis if there are in consistent clinical findings for pancreatitis. 3. No other acute abnormality within the abdomen or pelvis. 4. Mild hepatomegaly with extensive hepatic steatosis. Medications given: 3 L of normal saline bolus, IV cefepime and Flagyl, 1 g calcium gluconate  Upon arrival patient had soft blood pressure 98/58 which was fluid responsive.  Blood pressure again dropped to 87/58, which is again fluid responsive to additional liter of fluid bolus.  PCCM is consulted due to the potential need for vasopressors.  Pertinent  Medical History  Pancreatitis Pancreatic pseudocyst Hypertriglyceridemia Hyperlipidemia Hypertension Fatty liver Diabetes mellitus C. difficile infection Hypogonadism  Micro Data:  02/03/2022: SARS-CoV-2 and influenza PCR>> negative 02/03/2022: Blood culture x2>> 02/03/2022: Respiratory viral panel>> 02/03/2022: Strep pneumo urinary antigen>> 02/03/2022: Legionella urinary antigen>> 02/03/2022: Sputum>>  Antimicrobials:  Cefepime 2/25 x 1 dose Flagyl 2/25 x 1 dose Zosyn 2/25>>  Significant Hospital Events: Including procedures, antibiotic start and stop dates in addition to other pertinent events   2/25: To be admitted by the hospitalist for acute pancreatitis.  Brief hypotension which was fluid responsive.  PCCM consulted due to need for possible vasopressors  Interim History / Subjective:  -Patient presents to ED due to progressive 2-day history of abdominal pain and abdominal distention -Found to have acute pancreatitis, to be admitted by the hospitalist -While in the ED briefly hypotensive systolic blood pressures in the 80s ~noted to be fluid responsive -PCCM consulted in case of need of vasopressors -Triglycerides found to be 4250 ~will need  insulin drip per protocol  Objective   Blood pressure (!) 87/58, pulse (!) 109, temperature 98.2 F (36.8 C), temperature source Oral, resp. rate (!) 26, height 6' (1.829 m), weight 113.4  kg, SpO2 91 %.        Intake/Output Summary (Last 24 hours) at 02/03/2022 1730 Last data filed at 02/03/2022 1644 Gross per 24 hour  Intake 3350.98 ml  Output --  Net 3350.98 ml   Filed Weights   02/03/22 1107  Weight: 113.4 kg    Examination: General: Acutely ill-appearing male, sitting in bed, on room air, no acute distress (status post administration of pain medication) HENT: Atraumatic, normocephalic, neck supple, no JVD Lungs: Clear breath sounds bilaterally, no rales or wheezing noted, even, nonlabored Cardiovascular: Tachycardia, regular rhythm, S1-S2, no murmurs, rubs, gallops Abdomen: Soft, distended, tenderness to palpation, no rebound tenderness Extremities: Normal bulk and tone, no deformities, no edema Neuro: Awake, alert and oriented x3, follows commands, no focal deficits, speech clear, pupils PERRLA GU: Deferred  Resolved Hospital Problem list     Assessment & Plan:   Severe Sepsis in the setting of Acute Pancreatitis, concern for possible multilobar bronchopneumonia Meets SIRS Criteria at admission (HR 133, RR 22, WBC 17.7, lactic 5) -Monitor fever curve -Trend WBC's & Procalcitonin -Follow cultures as above -Continue empiric Zosyn pending cultures & sensitivities  Brief Hypotension ~ fluid responsive PMHx: Hypertension, hyperlipidemia -Continuous cardiac monitoring -Maintain MAP >65 -IV fluids, NS @ 150 cc/hr (received 4L boluses in ED) -Vasopressors as needed to maintain MAP goal -Trend lactic acid until normalized (5.0 ~ 3.6 ~ ) -Trend HS Troponin until peaked (15 ~ ) -Hold home Cozaar, Norvasc, Metoprolol  Acute Pancreatitis in the setting of hypertriglyceridemia PMHx: Recurrent Pancreatitis attributed to gallbladder s/p cholecystectomy, Hypertriglyceridemia,  hepatic steatosis -NPO -Aggressive IV fluids, NS @ 150 cc/hr (received 4L of boluses in ED) -Pain management -Serum Triglycerides is 4250 ~will start insulin drip with dextrose infusion per protocol -CT Abdomen/Pelvis 2/25 with acute pancreatitis, with significant peripancreatic inflammatory changes and trace ascites.  No discrete acute peripancreatic fluid collection -Consider GI consult  Acute Kidney Injury Mild Hyponatremia Anion Gap metabolic Acidosis in the setting of Lactic Acidosis & AKI -Monitor I&O's / urinary output -Follow BMP -Ensure adequate renal perfusion -Avoid nephrotoxic agents as able -Replace electrolytes as indicated -IVF -Start Bicarb gtt @ 75 cc/hr -Trend lactic  Diabetes Mellitus -We will start insulin drip due to hypertriglyceridemia -Follow ICU Hypo/Hyperglycemia protocol    Best Practice (right click and "Reselect all SmartList Selections" daily)   Diet/type: NPO DVT prophylaxis: LMWH GI prophylaxis: PPI Lines: N/A Foley:  N/A Code Status:  full code Last date of multidisciplinary goals of care discussion [N/A]  Labs   CBC: Recent Labs  Lab 02/03/22 1109  WBC 17.7*  HGB 17.2*  HCT 48.5  MCV 85.4  PLT 144    Basic Metabolic Panel: Recent Labs  Lab 02/03/22 1109  NA 132*  K 5.0  CL 100  CO2 12*  GLUCOSE 348*  BUN 19  CREATININE 1.43*  CALCIUM 7.2*   GFR: Estimated Creatinine Clearance: 72.3 mL/min (A) (by C-G formula based on SCr of 1.43 mg/dL (H)). Recent Labs  Lab 02/03/22 1109 02/03/22 1216 02/03/22 1447 02/03/22 1642  PROCALCITON  --   --   --  8.01  WBC 17.7*  --   --   --   LATICACIDVEN  --  5.0* 3.6*  --     Liver Function Tests: Recent Labs  Lab 02/03/22 1109  AST 39  ALT 25  ALKPHOS 67  BILITOT 1.5*  PROT 6.3*  ALBUMIN 3.3*   Recent Labs  Lab 02/03/22 1109  LIPASE 637*   No  results for input(s): AMMONIA in the last 168 hours.  ABG    Component Value Date/Time   HCO3 21.6 02/03/2022 1216    ACIDBASEDEF 4.3 (H) 02/03/2022 1216   O2SAT 38.2 02/03/2022 1216     Coagulation Profile: Recent Labs  Lab 02/03/22 1216  INR 1.1    Cardiac Enzymes: No results for input(s): CKTOTAL, CKMB, CKMBINDEX, TROPONINI in the last 168 hours.  HbA1C: Hgb A1c MFr Bld  Date/Time Value Ref Range Status  09/15/2021 08:16 AM 8.5 (H) 4.6 - 6.5 % Final    Comment:    Glycemic Control Guidelines for People with Diabetes:Non Diabetic:  <6%Goal of Therapy: <7%Additional Action Suggested:  >8%   02/03/2021 07:46 AM 7.1 (H) 4.6 - 6.5 % Final    Comment:    Glycemic Control Guidelines for People with Diabetes:Non Diabetic:  <6%Goal of Therapy: <7%Additional Action Suggested:  >8%     CBG: Recent Labs  Lab 02/03/22 1658  GLUCAP 244*    Review of Systems:   Positives in BOLD: Gen: Denies fever, chills, weight change, fatigue, night sweats HEENT: Denies blurred vision, double vision, hearing loss, tinnitus, sinus congestion, rhinorrhea, sore throat, neck stiffness, dysphagia PULM: Denies shortness of breath, cough, sputum production, hemoptysis, wheezing CV: Denies chest pain, edema, orthopnea, paroxysmal nocturnal dyspnea, palpitations GI: Denies abdominal pain, nausea, vomiting, diarrhea, hematochezia, melena, constipation, change in bowel habits GU: Denies dysuria, hematuria, polyuria, oliguria, urethral discharge Endocrine: Denies hot or cold intolerance, polyuria, polyphagia or appetite change Derm: Denies rash, dry skin, scaling or peeling skin change Heme: Denies easy bruising, bleeding, bleeding gums Neuro: Denies headache, numbness, weakness, slurred speech, loss of memory or consciousness   Past Medical History:  He,  has a past medical history of Clostridium difficile infection (2010), Diabetes mellitus, Elevated LFTs (2011), Fatty liver, Hyperlipidemia, Hypertension, Hypogonadism male (2012), Leg fracture, left, Neck pain, Osteopenia (2011), Pancreatic pseudocyst, Pancreatitis  (2010), and Vitamin B 12 deficiency.   Surgical History:   Past Surgical History:  Procedure Laterality Date   CHOLECYSTECTOMY     ELBOW SURGERY     right   HERNIA REPAIR     LEG SURGERY     Left fracture   TIBIA FRACTURE SURGERY     left     Social History:   reports that he has never smoked. He has never used smokeless tobacco. He reports current alcohol use. He reports that he does not use drugs.   Family History:  His family history includes Alcohol abuse in his father; COPD in his mother; Colon polyps in his father; Peripheral vascular disease in his mother. There is no history of Colon cancer.   Allergies Allergies  Allergen Reactions   Gemfibrozil     REACTION: abn LFTs     Home Medications  Prior to Admission medications   Medication Sig Start Date End Date Taking? Authorizing Provider  acetaminophen (TYLENOL) 500 MG tablet Take 1,000 mg by mouth every 6 (six) hours as needed for mild pain.   Yes [provider]  amLODipine (NORVASC) 5 MG tablet TAKE 1 TABLET BY MOUTH EVERY DAY 01/23/22  Yes Biagio Borg, MD  Cholecalciferol (THERA-D 2000) 50 MCG (2000 UT) TABS 1 tab by mouth once daily 02/10/21  Yes Biagio Borg, MD  cyanocobalamin (,VITAMIN B-12,) 1000 MCG/ML injection Inject 1 mL (1,000 mcg total) into the muscle every 30 (thirty) days. 02/10/21  Yes Biagio Borg, MD  dapagliflozin propanediol (FARXIGA) 10 MG TABS tablet Take 1 tablet (  10 mg total) by mouth daily. 02/10/21  Yes Biagio Borg, MD  glipiZIDE (GLUCOTROL XL) 5 MG 24 hr tablet TAKE 1 TABLET BY MOUTH EVERY DAY WITH BREAKFAST 01/23/22  Yes Biagio Borg, MD  losartan (COZAAR) 100 MG tablet Take 1 tablet (100 mg total) by mouth daily. 02/10/21  Yes Biagio Borg, MD  metoprolol tartrate (LOPRESSOR) 50 MG tablet Take 1 tablet (50 mg total) by mouth 2 (two) times daily. 02/10/21  Yes Biagio Borg, MD  naproxen (NAPROSYN) 500 MG tablet TAKE 1 TABLET BY MOUTH TWICE A DAY AS NEEDED 10/16/21  Yes Biagio Borg,  MD  VITAMIN K PO Take 1 tablet by mouth daily.    Yes [provider]  atorvastatin (LIPITOR) 40 MG tablet Take 1 tablet (40 mg total) by mouth daily at 6 PM. 02/10/21   Biagio Borg, MD  b complex vitamins tablet Take 1 tablet by mouth daily. Patient not taking: Reported on 02/03/2022    [provider]  Diclofenac Sodium (PENNSAID) 2 % SOLN Place 2 g 2 (two) times daily onto the skin. Patient not taking: Reported on 02/03/2022 10/23/17   Lyndal Pulley, DO  fenofibrate (TRICOR) 145 MG tablet Take 1 tablet (145 mg total) by mouth daily. 02/10/21   Biagio Borg, MD  glucose blood (ONE TOUCH ULTRA TEST) test strip USE ONCE DAILY AS NEEDED 09/16/14   Golden Circle, FNP  metFORMIN (GLUCOPHAGE) 500 MG tablet Take 1 tablet (500 mg total) by mouth 3 (three) times daily. Patient not taking: Reported on 02/03/2022 02/10/21   Biagio Borg, MD  omega-3 acid ethyl esters (LOVAZA) 1 g capsule Take 3 capsules (3 g total) by mouth at bedtime. Patient not taking: Reported on 02/03/2022 02/10/21   Biagio Borg, MD     Critical care time: 55 minutes     Darel Hong, AGACNP-BC  Pulmonary & Critical Care Prefer epic messenger for cross cover needs If after hours, please call E-link

## 2022-02-03 NOTE — ED Provider Notes (Signed)
[facility] Provider Note    Event Date/Time   First MD Initiated Contact with Patient 02/03/22 1113     (approximate)   History   Abdominal Pain   HPI  Kevin Miller is a 61 y.o. male  here with abd pain, distension, weakness. Pt reports that for past 2 days, he has had progressively worsening abd pain and distension. Woke up 2 days ago with distension, minimal pain. Since then, his pain has worsened and is now severe, 10/10, worse w/ any movement. H/o pancreatitis with somewhat similar sx, was told that was due to [facility] and had a cholecystectomy. No recent med changes. No EtOH use. No fevers. No known sick contacts. Pain is slightly worse on L>R but is general.      Physical Exam   Triage Vital Signs: ED Triage Vitals  Enc Vitals Group     BP 02/03/22 1106 (!) 98/58     Pulse Rate 02/03/22 1106 (!) 133     Resp 02/03/22 1106 (!) 22     Temp 02/03/22 1117 98.2 F (36.8 C)     Temp Source 02/03/22 1106 Oral     SpO2 02/03/22 1106 94 %     Weight 02/03/22 1107 250 lb (113.4 kg)     Height 02/03/22 1107 6' (1.829 m)     Head Circumference --      Peak Flow --      Pain Score 02/03/22 1106 10     Pain Loc --      Pain Edu? --      Excl. in [place]? --     Most recent vital signs: Vitals:   02/03/22 1325 02/03/22 1330  BP: 105/72 103/70  Pulse: (!) 115 (!) 112  Resp: (!) 30 17  Temp:    SpO2: 97% 100%     General: Awake, no distress.  CV:  Tachycardic, no murmurs.  Poor skin turgor.   Resp:  Normal effort.  Slight tachypnea.  Lungs clear. Abd:  Moderate distention with diffuse tenderness, slight guarding.  No overt rebound.  No fluid wave. Other:  Dehydrated, markedly dry mucous membranes and slight skin tenting.   ED Results / Procedures / Treatments   Labs (all labs ordered are listed, but only abnormal results are displayed) Labs Reviewed  LIPASE, BLOOD - Abnormal; Notable for the following components:      Result Value   Lipase  637 (*)    All other components within normal limits  COMPREHENSIVE METABOLIC PANEL - Abnormal; Notable for the following components:   Sodium 132 (*)    CO2 12 (*)    Glucose, Bld 348 (*)    Creatinine, Ser 1.43 (*)    Calcium 7.2 (*)    Total Protein 6.3 (*)    Albumin 3.3 (*)    Total Bilirubin 1.5 (*)    GFR, Estimated 56 (*)    Anion gap 20 (*)    All other components within normal limits  CBC - Abnormal; Notable for the following components:   WBC 17.7 (*)    Hemoglobin 17.2 (*)    RDW 15.7 (*)    All other components within normal limits  LACTIC ACID, PLASMA - Abnormal; Notable for the following components:   Lactic Acid, Venous 5.0 (*)    All other components within normal limits  BLOOD GAS, VENOUS - Abnormal; Notable for the following components:   pCO2, Ven 42 (*)    pO2, Ven <31 (*)  Acid-base deficit 4.3 (*)    All other components within normal limits  RESP PANEL BY RT-PCR (FLU A&B, COVID) ARPGX2  CULTURE, BLOOD (ROUTINE X 2)  CULTURE, BLOOD (ROUTINE X 2)  PROTIME-INR  BRAIN NATRIURETIC PEPTIDE  URINALYSIS, ROUTINE W REFLEX MICROSCOPIC  LACTIC ACID, PLASMA  PROCALCITONIN  HIV ANTIBODY (ROUTINE TESTING W REFLEX)  TRIGLYCERIDES  TROPONIN I (HIGH SENSITIVITY)     EKG Sinus tachycardia, ventricular rate 134.  PR 144, QRS 88, QTc 459.  Nonspecific ST changes, likely rate related but with diffuse inferior T wave inversions.   RADIOLOGY Chest x-ray: Concerning for possible bronchitis and developing pneumonia   I also independently reviewed and agree wit radiologist interpretations.   PROCEDURES:  Critical Care performed: Yes, see critical care procedure note(s)  .Critical Care Performed by: Duffy Bruce, MD Authorized by: Duffy Bruce, MD   Critical care provider statement:    Critical care time (minutes):  30   Critical care time was exclusive of:  Separately billable procedures and treating other patients   Critical care was necessary to  treat or prevent imminent or life-threatening deterioration of the following conditions:  Cardiac failure, circulatory failure, respiratory failure and sepsis   Critical care was time spent personally by me on the following activities:  Development of treatment plan with patient or surrogate, discussions with consultants, evaluation of patient's response to treatment, examination of patient, ordering and review of laboratory studies, ordering and review of radiographic studies, ordering and performing treatments and interventions, pulse oximetry, re-evaluation of patient's condition and review of old charts    MEDICATIONS ORDERED IN ED: Medications  pantoprazole (PROTONIX) 80 mg /NS 100 mL IVPB (80 mg Intravenous New Bag/Given 02/03/22 1501)  calcium gluconate 1 g/ 50 mL sodium chloride IVPB (has no administration in time range)  0.9 %  sodium chloride infusion (has no administration in time range)  insulin glargine-yfgn (SEMGLEE) injection 8 Units (has no administration in time range)  insulin aspart (novoLOG) injection 0-9 Units (has no administration in time range)  insulin aspart (novoLOG) injection 0-5 Units (has no administration in time range)  HYDROmorphone (DILAUDID) injection 1 mg (has no administration in time range)  oxyCODONE-acetaminophen (PERCOCET/ROXICET) 5-325 MG per tablet 1 tablet (has no administration in time range)  ondansetron (ZOFRAN) injection 4 mg (has no administration in time range)  acetaminophen (TYLENOL) tablet 650 mg (has no administration in time range)  hydrALAZINE (APRESOLINE) injection 5 mg (has no administration in time range)  albuterol (PROVENTIL) (2.5 MG/3ML) 0.083% nebulizer solution 3 mL (has no administration in time range)  dextromethorphan-guaiFENesin (MUCINEX DM) 30-600 MG per 12 hr tablet 1 tablet (has no administration in time range)  enoxaparin (LOVENOX) injection 57.5 mg (has no administration in time range)  lactated ringers bolus 2,000 mL (0 mLs  Intravenous Stopped 02/03/22 1320)  metroNIDAZOLE (FLAGYL) IVPB 500 mg (0 mg Intravenous Stopped 02/03/22 1336)  ceFEPIme (MAXIPIME) 2 g in sodium chloride 0.9 % 100 mL IVPB (0 g Intravenous Stopped 02/03/22 1256)  sodium chloride 0.9 % bolus 1,000 mL (0 mLs Intravenous Stopped 02/03/22 1400)  HYDROmorphone (DILAUDID) injection 0.5 mg (0.5 mg Intravenous Given 02/03/22 1504)  ondansetron (ZOFRAN) injection 4 mg (4 mg Intravenous Given 02/03/22 1504)     IMPRESSION / MDM / [place] / ED COURSE  I reviewed the triage vital signs and the nursing notes.  The patient is on the cardiac monitor to evaluate for evidence of arrhythmia and/or significant heart rate changes.  MDM:  61 year old male with history of diabetes, hypertension, prior episode of pancreatitis, here with severe abdominal pain.  Patient arrives markedly tachycardic, hypotensive, and in mild distress due to pain.  Lab work and imaging as above.  Suspect acute, severe pancreatitis, possibly in the setting of his diabetic medications.  Lab work shows significant lactic acidosis, metabolic acidosis, as well as marked lipase elevation.  LFTs and bili are normal and I see no evidence to suggest retained stone.  Status post cholecystectomy.  No fevers, significant leukocytosis, or evidence to suggest infection.  CT scan obtained, reviewed, shows no evidence of significant pseudocyst or other complication.  No evidence of obstruction.  Plan to admit the patient for IV fluids and symptomatic control.  He is at fairly significant risk of decompensation given the degree of his pancreatitis on CT, lactic acidosis, and vitals.  Patient updated and is in agreement with this plan.   MEDICATIONS GIVEN IN ED: Medications  pantoprazole (PROTONIX) 80 mg /NS 100 mL IVPB (80 mg Intravenous New Bag/Given 02/03/22 1501)  calcium gluconate 1 g/ 50 mL sodium chloride IVPB (has no administration in time range)  0.9 %   sodium chloride infusion (has no administration in time range)  insulin glargine-yfgn (SEMGLEE) injection 8 Units (has no administration in time range)  insulin aspart (novoLOG) injection 0-9 Units (has no administration in time range)  insulin aspart (novoLOG) injection 0-5 Units (has no administration in time range)  HYDROmorphone (DILAUDID) injection 1 mg (has no administration in time range)  oxyCODONE-acetaminophen (PERCOCET/ROXICET) 5-325 MG per tablet 1 tablet (has no administration in time range)  ondansetron (ZOFRAN) injection 4 mg (has no administration in time range)  acetaminophen (TYLENOL) tablet 650 mg (has no administration in time range)  hydrALAZINE (APRESOLINE) injection 5 mg (has no administration in time range)  albuterol (PROVENTIL) (2.5 MG/3ML) 0.083% nebulizer solution 3 mL (has no administration in time range)  dextromethorphan-guaiFENesin (MUCINEX DM) 30-600 MG per 12 hr tablet 1 tablet (has no administration in time range)  enoxaparin (LOVENOX) injection 57.5 mg (has no administration in time range)  lactated ringers bolus 2,000 mL (0 mLs Intravenous Stopped 02/03/22 1320)  metroNIDAZOLE (FLAGYL) IVPB 500 mg (0 mg Intravenous Stopped 02/03/22 1336)  ceFEPIme (MAXIPIME) 2 g in sodium chloride 0.9 % 100 mL IVPB (0 g Intravenous Stopped 02/03/22 1256)  sodium chloride 0.9 % bolus 1,000 mL (0 mLs Intravenous Stopped 02/03/22 1400)  HYDROmorphone (DILAUDID) injection 0.5 mg (0.5 mg Intravenous Given 02/03/22 1504)  ondansetron (ZOFRAN) injection 4 mg (4 mg Intravenous Given 02/03/22 1504)     Consults:  Hospitalist   EMR reviewed  PCP notes with Cathlean Cower 02/2021     FINAL CLINICAL IMPRESSION(S) / ED DIAGNOSES   Final diagnoses:  Acute pancreatitis without infection or necrosis, unspecified pancreatitis type  Lactic acidosis  SIRS (systemic inflammatory response syndrome) ([place])     Rx / DC Orders   ED Discharge Orders     None        Note:  This  document was prepared using Dragon voice recognition software and may include unintentional dictation errors.   Duffy Bruce, MD 02/03/22 [phone]

## 2022-02-04 ENCOUNTER — Inpatient Hospital Stay: Payer: Self-pay

## 2022-02-04 LAB — EXPECTORATED SPUTUM ASSESSMENT W GRAM STAIN, RFLX TO RESP C

## 2022-02-04 LAB — RESPIRATORY PANEL BY PCR

## 2022-02-04 LAB — CBC
HCT: 35.5 % — ABNORMAL LOW (ref 39.0–52.0)
Hemoglobin: 12.1 g/dL — ABNORMAL LOW (ref 13.0–17.0)
MCH: 29.2 pg (ref 26.0–34.0)
MCHC: 34.1 g/dL (ref 30.0–36.0)
MCV: 85.7 fL (ref 80.0–100.0)
Platelets: 228 10*3/uL (ref 150–400)
RBC: 4.14 MIL/uL — ABNORMAL LOW (ref 4.22–5.81)
RDW: 15.7 % — ABNORMAL HIGH (ref 11.5–15.5)
WBC: 12.5 10*3/uL — ABNORMAL HIGH (ref 4.0–10.5)
nRBC: 0 % (ref 0.0–0.2)

## 2022-02-04 LAB — TRIGLYCERIDES
Triglycerides: 2486 mg/dL — ABNORMAL HIGH (ref <150)
Triglycerides: 2738 mg/dL — ABNORMAL HIGH (ref <150)
Triglycerides: 2421 mg/dL — ABNORMAL HIGH (ref <150)

## 2022-02-04 LAB — PHOSPHORUS: Phosphorus: 2.8 mg/dL (ref 2.5–4.6)

## 2022-02-04 LAB — BASIC METABOLIC PANEL
Anion gap: 12 (ref 5–15)
Anion gap: 8 (ref 5–15)
Anion gap: 12 (ref 5–15)
Anion gap: 14 (ref 5–15)
BUN: 23 mg/dL — ABNORMAL HIGH (ref 6–20)
BUN: 26 mg/dL — ABNORMAL HIGH (ref 6–20)
BUN: 23 mg/dL — ABNORMAL HIGH (ref 6–20)
BUN: 21 mg/dL — ABNORMAL HIGH (ref 6–20)
CO2: 21 mmol/L — ABNORMAL LOW (ref 22–32)
CO2: 22 mmol/L (ref 22–32)
CO2: 24 mmol/L (ref 22–32)
CO2: 24 mmol/L (ref 22–32)
Calcium: 6.6 mg/dL — ABNORMAL LOW (ref 8.9–10.3)
Calcium: 6.5 mg/dL — ABNORMAL LOW (ref 8.9–10.3)
Calcium: 6.7 mg/dL — ABNORMAL LOW (ref 8.9–10.3)
Calcium: 7 mg/dL — ABNORMAL LOW (ref 8.9–10.3)
Chloride: 101 mmol/L (ref 98–111)
Chloride: 102 mmol/L (ref 98–111)
Chloride: 98 mmol/L (ref 98–111)
Chloride: 96 mmol/L — ABNORMAL LOW (ref 98–111)
Creatinine, Ser: 1.39 mg/dL — ABNORMAL HIGH (ref 0.61–1.24)
Creatinine, Ser: 1.28 mg/dL — ABNORMAL HIGH (ref 0.61–1.24)
Creatinine, Ser: 1.15 mg/dL (ref 0.61–1.24)
Creatinine, Ser: 1.12 mg/dL (ref 0.61–1.24)
GFR, Estimated: 58 mL/min — ABNORMAL LOW (ref >60)
GFR, Estimated: >60 mL/min (ref >60)
GFR, Estimated: >60 mL/min (ref >60)
GFR, Estimated: >60 mL/min (ref >60)
Glucose, Bld: 56 mg/dL — ABNORMAL LOW (ref 70–99)
Glucose, Bld: 68 mg/dL — ABNORMAL LOW (ref 70–99)
Glucose, Bld: 70 mg/dL (ref 70–99)
Glucose, Bld: 98 mg/dL (ref 70–99)
Potassium: 3.8 mmol/L (ref 3.5–5.1)
Potassium: 3.7 mmol/L (ref 3.5–5.1)
Potassium: 3.4 mmol/L — ABNORMAL LOW (ref 3.5–5.1)
Potassium: 3.7 mmol/L (ref 3.5–5.1)
Sodium: 134 mmol/L — ABNORMAL LOW (ref 135–145)
Sodium: 132 mmol/L — ABNORMAL LOW (ref 135–145)
Sodium: 134 mmol/L — ABNORMAL LOW (ref 135–145)
Sodium: 134 mmol/L — ABNORMAL LOW (ref 135–145)

## 2022-02-04 LAB — GLUCOSE, CAPILLARY
Glucose-Capillary: 101 mg/dL — ABNORMAL HIGH (ref 70–99)
Glucose-Capillary: 101 mg/dL — ABNORMAL HIGH (ref 70–99)
Glucose-Capillary: 110 mg/dL — ABNORMAL HIGH (ref 70–99)
Glucose-Capillary: 114 mg/dL — ABNORMAL HIGH (ref 70–99)
Glucose-Capillary: 121 mg/dL — ABNORMAL HIGH (ref 70–99)
Glucose-Capillary: 122 mg/dL — ABNORMAL HIGH (ref 70–99)
Glucose-Capillary: 136 mg/dL — ABNORMAL HIGH (ref 70–99)
Glucose-Capillary: 141 mg/dL — ABNORMAL HIGH (ref 70–99)
Glucose-Capillary: 168 mg/dL — ABNORMAL HIGH (ref 70–99)
Glucose-Capillary: 79 mg/dL (ref 70–99)
Glucose-Capillary: 80 mg/dL (ref 70–99)
Glucose-Capillary: 82 mg/dL (ref 70–99)
Glucose-Capillary: 84 mg/dL (ref 70–99)
Glucose-Capillary: 89 mg/dL (ref 70–99)
Glucose-Capillary: 91 mg/dL (ref 70–99)
Glucose-Capillary: 91 mg/dL (ref 70–99)
Glucose-Capillary: 92 mg/dL (ref 70–99)
Glucose-Capillary: 94 mg/dL (ref 70–99)
Glucose-Capillary: 95 mg/dL (ref 70–99)
Glucose-Capillary: 99 mg/dL (ref 70–99)
Glucose-Capillary: 99 mg/dL (ref 70–99)

## 2022-02-04 LAB — MAGNESIUM: Magnesium: 2.3 mg/dL (ref 1.7–2.4)

## 2022-02-04 LAB — STREP PNEUMONIAE URINARY ANTIGEN: Strep Pneumo Urinary Antigen: NEGATIVE

## 2022-02-04 LAB — PROCALCITONIN: Procalcitonin: 9.69 ng/mL

## 2022-02-04 MED ORDER — SODIUM CHLORIDE 0.9% FLUSH: 10.0000 mL | INTRAVENOUS | Status: DC | PRN

## 2022-02-04 MED ORDER — POTASSIUM CHLORIDE 10 MEQ/100ML IV SOLN
10.0000 meq | INTRAVENOUS | Status: AC
  Administered 2022-02-04 (×3): 10 meq via INTRAVENOUS
  Filled 2022-02-04 (×3): qty 100

## 2022-02-04 MED ORDER — CALCIUM GLUCONATE-NACL 1-0.675 GM/50ML-% IV SOLN
1.0000 g | INTRAVENOUS | Status: AC
  Administered 2022-02-04 (×2): 1000 mg via INTRAVENOUS
  Filled 2022-02-04 (×2): qty 50

## 2022-02-04 MED ORDER — LACTATED RINGERS IV SOLN: INTRAVENOUS | Status: DC

## 2022-02-04 MED ORDER — POTASSIUM CHLORIDE 10 MEQ/100ML IV SOLN
10.0000 meq | INTRAVENOUS | Status: AC
  Administered 2022-02-04 (×4): 10 meq via INTRAVENOUS
  Filled 2022-02-04 (×4): qty 100

## 2022-02-04 MED ORDER — DEXTROSE 10 % IV SOLN: INTRAVENOUS | Status: DC

## 2022-02-04 MED ORDER — FENTANYL CITRATE PF 50 MCG/ML IJ SOSY
12.5000 ug | PREFILLED_SYRINGE | Freq: Once | INTRAMUSCULAR | Status: AC
  Administered 2022-02-04: 12.5 ug via INTRAVENOUS
  Filled 2022-02-04: qty 1

## 2022-02-04 MED ORDER — METOPROLOL TARTRATE 25 MG PO TABS
12.5000 mg | ORAL_TABLET | Freq: Two times a day (BID) | ORAL | Status: DC
  Administered 2022-02-04 – 2022-02-06 (×4): 12.5 mg via ORAL
  Filled 2022-02-04 (×4): qty 1

## 2022-02-04 MED ORDER — CHLORHEXIDINE GLUCONATE CLOTH 2 % EX PADS
6.0000 | MEDICATED_PAD | Freq: Every day | CUTANEOUS | Status: DC
  Administered 2022-02-04 – 2022-02-12 (×9): 6 via TOPICAL

## 2022-02-04 MED ORDER — POTASSIUM CHLORIDE 10 MEQ/100ML IV SOLN
10.0000 meq | INTRAVENOUS | Status: AC
  Administered 2022-02-04 – 2022-02-05 (×2): 10 meq via INTRAVENOUS
  Filled 2022-02-04 (×2): qty 100

## 2022-02-04 MED ORDER — POTASSIUM CHLORIDE 10 MEQ/100ML IV SOLN
10.0000 meq | INTRAVENOUS | Status: AC
  Administered 2022-02-04 (×2): 10 meq via INTRAVENOUS
  Filled 2022-02-04 (×3): qty 100

## 2022-02-04 MED ORDER — SODIUM CHLORIDE 0.9% FLUSH
10.0000 mL | Freq: Two times a day (BID) | INTRAVENOUS | Status: DC
  Administered 2022-02-04: 10 mL
  Administered 2022-02-05: 40 mL
  Administered 2022-02-05: 10 mL
  Administered 2022-02-06 (×2): 40 mL
  Administered 2022-02-07 – 2022-02-11 (×10): 10 mL

## 2022-02-04 MED ORDER — CALCIUM GLUCONATE-NACL 1-0.675 GM/50ML-% IV SOLN
1.0000 g | Freq: Once | INTRAVENOUS | Status: AC
  Administered 2022-02-04: 1000 mg via INTRAVENOUS
  Filled 2022-02-04: qty 50

## 2022-02-04 MED ORDER — FENTANYL CITRATE PF 50 MCG/ML IJ SOSY
12.5000 ug | PREFILLED_SYRINGE | INTRAMUSCULAR | Status: DC | PRN
  Administered 2022-02-04 – 2022-02-05 (×9): 12.5 ug via INTRAVENOUS
  Filled 2022-02-04 (×9): qty 1

## 2022-02-04 MED ORDER — FENTANYL CITRATE PF 50 MCG/ML IJ SOSY: 12.5000 ug | PREFILLED_SYRINGE | Freq: Once | INTRAMUSCULAR | Status: DC

## 2022-02-04 NOTE — Assessment & Plan Note (Addendum)
Presented with hypotension, leukocytosis, AKI.  Likely from pneumonia.  Cultures: NGTD. Elevated procalcitonin.  Currently hemodynamically stable.  Finished antibiotics course

## 2022-02-04 NOTE — Assessment & Plan Note (Addendum)
Root problem for pancreatitis.  Triglyceride level was in the range of 4000 on admission.  Started on insulin drip with significant improvement.  Insulin drip was stopped.  Continue high-dose Lipitor, fenofibrate.  He needs to follow-up with lipid clinic as an outpatient

## 2022-02-04 NOTE — Assessment & Plan Note (Signed)
On Lipitor and fenofibrate

## 2022-02-04 NOTE — Progress Notes (Signed)
eLink Physician-Brief Progress Note Patient Name: Kevin Miller DOB: November 05, 1961 MRN: [id number]   Date of Service  02/04/2022  HPI/Events of Note  60/M with history of pancreatitis, pancreatic pseudocyst, presenting with abdominal pain. Workup in the ED consistent with acute pancreatitis, probably triggered by hypertryglyceridemia. He  was started on fluids, insulin and admitted to the ICU.   eICU Interventions  Acute pancreatitis - Continue IVF with D5 - Pain control with dilaudid, percocet, acetaminophen - Started on insulin drip for hypertriglyceridemia. Will continue to monitor serial TG levels.   2. SIRS/Sepsis - Pt with leukocytosis, tahcycardia, lactate up to 5.0, with borderline low BP.  - While some findings may be attributable to the above, there was also concern for developing pneumonia - Started on empiric antibiotics- Will follow cultures and deescalate as warranted.  - Trend WBC, lactate, temperature curve.  - Pt not requiring pressor support at this time.   3. AKI - Monitor I/Os as pt is volume resuscitated - renally dose medications, avoid nephrotoxins  4. DVT prophylaxis - on full dose lovenox.         Uthman Mroczkowski M DELA CRUZ 02/04/2022, 12:06 AM

## 2022-02-04 NOTE — Consult Note (Signed)
PHARMACY CONSULT NOTE - FOLLOW UP  Pharmacy Consult for Electrolyte Monitoring and Replacement   Recent Labs: Potassium (mmol/L)  Date Value  02/04/2022 3.4 (L)   Magnesium (mg/dL)  Date Value  02/04/2022 2.3   Calcium (mg/dL)  Date Value  02/04/2022 6.7 (L)   Albumin (g/dL)  Date Value  02/03/2022 2.6 (L)  07/26/2017 4.3   Phosphorus (mg/dL)  Date Value  02/04/2022 2.8   Sodium (mmol/L)  Date Value  02/04/2022 134 (L)  07/26/2017 138    Assessment: Patient admitted with pancreatitis, pancreatic pseudocyst, and sepsis. TG ar 4250 on admission and patient was p[placed on insulin gtt. Pharmacy was consulted to manage electrolytes.  Current hyperkalemia persist and primary provided following. Mg and phos remain within normal range. Calcium remains below goal with corrected Ca at 7.94 after 1gm of calcium gluconate already given today.  Goal of Therapy:  Electrolytes within  normal limits  Plan:  -K 3.4 @1036 . Patient has received 3 of 4 IV KCL bags ordered this am.  Will order KCL 10 meq IV x 3 bags  -Ca 6.7 @1036 (albumin 2.6).   Albumin corrected Ca=7.8. Will Replace Ca with 1gm of IV calcium gluconate x 2 doses. -Follow up electrolytes q4h thru 2/26 and morning labs and replace as needed.  Chinita Greenland PharmD Clinical Pharmacist 02/04/2022

## 2022-02-04 NOTE — Consult Note (Signed)
Pharmacy Antibiotic Note  Kevin Miller is a 61 y.o. male admitted on 02/03/2022 with sepsis 2/2 ?PNA or pancreatitis.  Pharmacy has been consulted for Zosyn dosing.  2/25 -CT imaging shows no acute/discrete peripancreatic fluid collection or abscess to indicate necrosis and 2/25 CXR shows developing bronchopneumonia. Recommend procalcitonin level to help steer abx decision since afebrile and leukocytosis may be reactive to pancreatitis inflammation.  Plan: Will initiate Zosyn 3.375g IV q8h (4 hour infusion). 1st dose starting on 2/25 @2100  based on last Cefepime and Metronidazole x1 doses recieved.    Height: 6' (182.9 cm) Weight: 113.4 kg (250 lb) IBW/kg (Calculated) : 77.6  Temp (24hrs), Avg:99.2 F (37.3 C), Min:98.2 F (36.8 C), Max:100.8 F (38.2 C)  Recent Labs  Lab 02/03/22 1109 02/03/22 1216 02/03/22 1447 02/03/22 1824 02/03/22 1911 02/03/22 2223 02/04/22 0301 02/04/22 0639  WBC 17.7*  --   --   --   --   --  12.5*  --   CREATININE 1.43*  --   --   --  1.31* 1.53* 1.39* 1.28*  LATICACIDVEN  --  5.0* 3.6* 1.6  --  1.8  --   --      Estimated Creatinine Clearance: 80.8 mL/min (A) (by C-G formula based on SCr of 1.28 mg/dL (H)).    Allergies  Allergen Reactions   Gemfibrozil     REACTION: abn LFTs    Antimicrobials this admission: CFP/MTZ x1 (2/25) Zosyn (2/25 >>   Dose adjustments this admission: CTM and adjust PRN  Microbiology results: 2/25 BCx: sent 2/25 Flu/Cov: neg/neg   Thank you for allowing pharmacy to be a part of this patients care.  Kevin Miller A 02/04/2022 11:05 AM

## 2022-02-04 NOTE — Care Plan (Signed)
Patient with pancreatitis due to hypertriglyceridemia.  Currently on insulin drip to manage hypertriglyceridemia.  Pharmacy following for electrolyte replacement.  Patient has not required pressors.  Patient is stepdown status.  PCCM will sign off.   Renold Don, MD Advanced Bronchoscopy PCCM [facility]

## 2022-02-04 NOTE — Plan of Care (Signed)
Continuing with plan of care. 

## 2022-02-04 NOTE — Consult Note (Signed)
PHARMACY CONSULT NOTE - FOLLOW UP  Pharmacy Consult for Electrolyte Monitoring and Replacement   Recent Labs: Potassium (mmol/L)  Date Value  02/04/2022 3.7   Magnesium (mg/dL)  Date Value  02/04/2022 2.3   Calcium (mg/dL)  Date Value  02/04/2022 7.0 (L)   Albumin (g/dL)  Date Value  02/03/2022 2.6 (L)  07/26/2017 4.3   Phosphorus (mg/dL)  Date Value  02/04/2022 2.8   Sodium (mmol/L)  Date Value  02/04/2022 134 (L)  07/26/2017 138    Assessment: Patient admitted with pancreatitis, pancreatic pseudocyst, and sepsis. TG ar 4250 on admission and patient was p[placed on insulin gtt. Pharmacy was consulted to manage electrolytes.  Current hyperkalemia persist and primary provided following. Mg and phos remain within normal range. Calcium remains below goal with corrected Ca at 7.94 after 1gm of calcium gluconate already given today.  Goal of Therapy:  Electrolytes within  normal limits  Plan:  Will order KCL 10 meq IV x 3 bags   -Follow up electrolytes q4h thru 2/26 and morning labs and replace as needed.  Eleonore Chiquito, PharmD Clinical Pharmacist 02/04/2022

## 2022-02-04 NOTE — Assessment & Plan Note (Addendum)
Takes Cozaar, amlodipine, metoprolol at home.  Restarted, remains mildly hypertensive.  Increased the dose of amlodipine to 10 mg daily, metoprolol increased to 75 mg twice daily

## 2022-02-04 NOTE — Progress Notes (Signed)
Patient had an eventful day, intial blood sugar this am was 80 and D10W fluids increased to 184mL, at 1130 blood sugar reading was 79 and patient was symptomatic, insulin gtt paused and Dr. Tawanna Solo notified, once triglyceride level came back at 12pm and was 2738, Dr. Tawanna Solo was notified and ordered to restart insulin gtt at half the rate.  Blood sugar was 122 at this time.  At 1450 blood sugar was 138 and Dr. Tawanna Solo ordered to decrease D10 to 71mL.  Dr. Tawanna Solo also ordered for a PICC line to be placed.  Towards the end part of the shift PICC was placed by PICC nurse.  Will endorse to oncoming shift.

## 2022-02-04 NOTE — Assessment & Plan Note (Addendum)
Takes glipizide, Farxiga at home.  No evidence of DKA.

## 2022-02-04 NOTE — Hospital Course (Addendum)
Patient is a 61 year old male with history of pancreatitis, pancreatic pseudocyst, hyperlipidemia, hypertriglyceridemia, hypertension, diabetes type 2, hypogonadism who presented with abdominal pain.  No history of alcohol use.  On presentation he was hypotensive so was started on IV fluids.  Lab work showed lipase level 637, creatinine of 1.4, leukocytosis.  Triglyceride level found in the range of 4000.  Patient was admitted for the management of pancreatitis secondary to hypertriglyceridemia.  Started on insulin drip.  Triglyceride level has significantly improved with insulin drip, drip discontinued.  Hospital course remarkable for persistent abdominal discomfort,now improving.  Diet advanced to full liquid.  Undergoing CT abdomen/pelvis for follow-up examination due to leukocytosis.

## 2022-02-04 NOTE — Progress Notes (Signed)
Peripherally Inserted Central Catheter Placement  The IV Nurse has discussed with the patient and/or persons authorized to consent for the patient, the purpose of this procedure and the potential benefits and risks involved with this procedure.  The benefits include less needle sticks, lab draws from the catheter, and the patient may be discharged home with the catheter. Risks include, but not limited to, infection, bleeding, blood clot (thrombus formation), and puncture of an artery; nerve damage and irregular heartbeat and possibility to perform a PICC exchange if needed/ordered by physician.  Alternatives to this procedure were also discussed.  Bard Power PICC patient education guide, fact sheet on infection prevention and patient information card has been provided to patient /or left at bedside.    PICC Placement Documentation  PICC Triple Lumen 61/84/85 Right Cephalic 37 cm 0 cm (Active)  Indication for Insertion or Continuance of Line Vasoactive infusions;Administration of hyperosmolar/irritating solutions (i.e. TPN, Vancomycin, etc.) 02/04/22 1935  Exposed Catheter (cm) 0 cm 02/04/22 1935  Site Assessment Clean, Dry, Intact 02/04/22 1935  Lumen #1 Status Flushed;Saline locked;Blood return noted 02/04/22 1935  Lumen #2 Status Flushed;Saline locked;Blood return noted 02/04/22 1935  Lumen #3 Status Flushed;Saline locked;Blood return noted 02/04/22 1935  Dressing Type Securing device 02/04/22 1935  Dressing Status Antimicrobial disc in place;Clean, Dry, Intact 02/04/22 1935  Safety Lock Not Applicable 92/76/39 4320  Line Care Connections checked and tightened 02/04/22 1935  Line Adjustment (NICU/IV Team Only) No 02/04/22 1935  Dressing Intervention New dressing 02/04/22 1935  Dressing Change Due 02/11/22 02/04/22 1935       Rolena Infante 02/04/2022, 7:36 PM

## 2022-02-04 NOTE — Assessment & Plan Note (Addendum)
Has history of pancreatitis, pancreatic pseudocyst.  CT abdomen/pelvis showed acute pancreatitis with significant peripancreatic inflammatory change, trace amount of ascites.  No discrete acute peripancreatic fluid collection.  Also showed thickening and inflammation of duodenum most likely reactive.  Pancreatitis secondary to hypertriglyceridemia. Abd xray does not show ileus.  Abdominal distention has improved.  Improved abdominal pain.  He had bowel movements.  Diet advanced to full, does not want to advance today. Developed unusual leukocytosis today.  On clinical examination, he does not have any alarming signs.  Ordered follow-up CT abdomen/pelvis with contrast

## 2022-02-04 NOTE — Consult Note (Deleted)
Pharmacy Antibiotic Note  Kevin Miller is a 61 y.o. male admitted on 02/03/2022 with sepsis 2/2 ?PNA or pancreatitis.  Pharmacy has been consulted for Zosyn dosing.  2/25 -CT imaging shows no acute/discrete peripancreatic fluid collection or abscess to indicate necrosis and 2/25 CXR shows developing bronchopneumonia. Recommend procalcitonin level to help steer abx decision since afebrile and leukocytosis may be reactive to pancreatitis inflammation.  Plan: Will initiate Zosyn 3.375g IV q8h (4 hour infusion). 1st dose starting on 2/25 @2100  based on last Cefepime and Metronidazole x1 doses recieved.  Height: 6' (182.9 cm) Weight: 113.4 kg (250 lb) IBW/kg (Calculated) : 77.6  Temp (24hrs), Avg:99.4 F (37.4 C), Min:98.8 F (37.1 C), Max:100.8 F (38.2 C)  Recent Labs  Lab 02/03/22 1109 02/03/22 1216 02/03/22 1447 02/03/22 1824 02/03/22 1911 02/03/22 2223 02/04/22 0301 02/04/22 0639 02/04/22 1036  WBC 17.7*  --   --   --   --   --  12.5*  --   --   CREATININE 1.43*  --   --   --  1.31* 1.53* 1.39* 1.28* 1.15  LATICACIDVEN  --  5.0* 3.6* 1.6  --  1.8  --   --   --      Estimated Creatinine Clearance: 89.9 mL/min (by C-G formula based on SCr of 1.15 mg/dL).    Allergies  Allergen Reactions   Gemfibrozil     REACTION: abn LFTs    Antimicrobials this admission: CFP/MTZ x1 (2/25) Zosyn (2/25 >>   Dose adjustments this admission: CTM and adjust PRN  Microbiology results: 2/25 BCx: sent 2/25 Flu/Cov: neg/neg   Thank you for allowing pharmacy to be a part of this patients care.  Craige Patel A 02/04/2022 12:15 PM

## 2022-02-04 NOTE — Assessment & Plan Note (Signed)
Resolved with IV fluids 

## 2022-02-04 NOTE — Assessment & Plan Note (Addendum)
Now on room air  Chest x-ray showed features of bronchitis/developing multilobular bilateral pneumonia.  Antibiotics changed to oral, completed course.  Follow-up chest x-ray yesterday did not show any acute findings.

## 2022-02-04 NOTE — Progress Notes (Signed)
PROGRESS NOTE  Kevin Miller  WLN:[id number] DOB: [birth year] DOA: 02/03/2022 PCP: Biagio Borg, MD   Brief Narrative: Patient is a 61 year old male with history of pancreatitis, pancreatic pseudocyst, hyperlipidemia, hypertriglyceridemia, hypertension, diabetes type 2, hypogonadism who presented with abdominal pain.  No history of alcohol use.  On presentation he was hypotensive so was started on IV fluids.  Lab work showed lipase level 637, creatinine of 1.4, leukocytosis.  Triglyceride level found in the range of 4000.  Patient was admitted for the management of pancreatitis secondary to hypertriglyceridemia.  Started on insulin drip.   Assessment & Plan:  Principal Problem:   Acute recurrent pancreatitis Active Problems:   Hyperlipidemia   Hypertriglyceridemia   Severe sepsis (HCC)   Diabetes mellitus without complication (HCC)   HTN (hypertension)   AKI (acute kidney injury) ([place])   Lactic acidosis   Acute bronchiolitis   Assessment and Plan: * Acute recurrent pancreatitis- (present on admission) Has history of pancreatitis, pancreatic pseudocyst.  CT abdomen/pelvis showed acute pancreatitis with significant peripancreatic inflammatory change, trace amount of ascites.  No discrete acute peripancreatic fluid collection.  Also showed thickening and inflammation of duodenum most likely reactive.  Pancreatitis secondary to hypertriglyceridemia.  Acute bronchiolitis- (present on admission) Now at 2 L of oxygen.  Chest x-ray showed features of bronchitis/developing multilobular bilateral pneumonia.  Continue current antibiotics: On Zosyn, azithromycin.    Lactic acidosis- (present on admission) Resolved with IV fluids  AKI (acute kidney injury) ([place])- (present on admission) Continue IV fluids.  Monitor kidney function.  Losartan on hold  HTN (hypertension)- (present on admission) Hypertensive on presentation.  Takes Cozaar, amlodipine, metoprolol at home.  Currently these  medications are on hold.  Diabetes mellitus without complication ([place]) Takes glipizide, Farxiga at home.  No evidence of DKA.  Continue current insulin regimen. Patient is currently hypoglycemic because he is on insulin drip.  Continue Current D10 infusion.  Avoid hypoglycemia  Severe sepsis ([place])- (present on admission) Presented with hypotension, leukocytosis, AKI.  Follow-up cultures.  Elevated procalcitonin.  Currently hemodynamically stable.  Continue current antibiotics  Hypertriglyceridemia- (present on admission) Root problem for pancreatitis.  Triglyceride level was in the range of 4000 on admission.  Started on insulin drip.  We will continue insulin drip until triglyceride level comes around 1000  Hyperlipidemia- (present on admission) On Lipitor and fenofibrate              DVT prophylaxis:     Code Status: Full Code  Family Communication: wife at bedside  Patient status:Inpatient  Patient is from :Home  Anticipated discharge DE:YCXK  Estimated DC date:in 2-3 days   Consultants: None  Procedures:None  Antimicrobials:  Anti-infectives (From admission, onward)    Start     Dose/Rate Route Frequency Ordered Stop   02/03/22 2100  piperacillin-tazobactam (ZOSYN) IVPB 3.375 g        3.375 g 12.5 mL/hr over 240 Minutes Intravenous Every 8 hours 02/03/22 1754     02/03/22 1800  azithromycin (ZITHROMAX) 500 mg in sodium chloride 0.9 % 250 mL IVPB        500 mg 250 mL/hr over 60 Minutes Intravenous Every 24 hours 02/03/22 1738     02/03/22 1200  metroNIDAZOLE (FLAGYL) IVPB 500 mg        500 mg 100 mL/hr over 60 Minutes Intravenous  Once 02/03/22 1146 02/03/22 1336   02/03/22 1200  ceFEPIme (MAXIPIME) 2 g in sodium chloride 0.9 % 100 mL IVPB  2 g 200 mL/hr over 30 Minutes Intravenous  Once 02/03/22 1149 02/03/22 1256       Subjective: Patient seen and examined at the bedside this morning.  Hemodynamically stable.  EKG monitor showing sinus  tachycardia.  He feels better today but he still has abdominal discomfort in the epigastric region.  Blood sugars running low due to need to be on insulin drip.  Wife at the bedside.  Does not look like in any kind of respiratory distress  Objective: Vitals:   02/04/22 0500 02/04/22 0600 02/04/22 0700 02/04/22 0800  BP: 101/66 124/71 122/69 120/61  Pulse: (!) 106 (!) 110 (!) 109 (!) 110  Resp: (!) 21 (!) 21 20 (!) 24  Temp:    98.8 F (37.1 C)  TempSrc:    Oral  SpO2: 90% 90% 91% 91%  Weight:      Height:        Intake/Output Summary (Last 24 hours) at 02/04/2022 1138 Last data filed at 02/04/2022 0500 Gross per 24 hour  Intake 8252.08 ml  Output 500 ml  Net 7752.08 ml   Filed Weights   02/03/22 1107  Weight: 113.4 kg    Examination:  General exam: Overall comfortable, not in distress HEENT: PERRL Respiratory system:  no wheezes or crackles, diminished air sounds on the bases Cardiovascular system: Sinus tachycardia Gastrointestinal system: Abdomen is distended, soft and tender in epigastric region Central nervous system: Alert and oriented Extremities: No edema, no clubbing ,no cyanosis Skin: No rashes, no ulcers,no icterus     Data Reviewed: I have personally reviewed following labs and imaging studies  CBC: Recent Labs  Lab 02/03/22 1109 02/04/22 0301  WBC 17.7* 12.5*  HGB 17.2* 12.1*  HCT 48.5 35.5*  MCV 85.4 85.7  PLT 362 947   Basic Metabolic Panel: Recent Labs  Lab 02/03/22 1109 02/03/22 1911 02/03/22 2223 02/04/22 0301 02/04/22 0639  NA 132* 132* 133* 134* 132*  K 5.0 5.2* 5.2* 3.8 3.7  CL 100 104 106 101 102  CO2 12* 16* 19* 21* 22  GLUCOSE 348* 203* 121* 56* 68*  BUN 19 22* 22* 23* 26*  CREATININE 1.43* 1.31* 1.53* 1.39* 1.28*  CALCIUM 7.2* 6.5* 6.6* 6.6* 6.5*  MG  --  1.8  --  2.3  --   PHOS  --  2.7  --  2.8  --      Recent Results (from the past 240 hour(s))  Blood Culture (routine x 2)     Status: None (Preliminary result)    Collection Time: 02/03/22 12:16 PM   Specimen: BLOOD  Result Value Ref Range Status   Specimen Description BLOOD BLOOD RIGHT WRIST  Final   Special Requests   Final    BOTTLES DRAWN AEROBIC AND ANAEROBIC Blood Culture adequate volume   Culture   Final    NO GROWTH < 24 HOURS Performed at [facility], [address] [place], [place] [address]    Report Status PENDING  Incomplete  Blood Culture (routine x 2)     Status: None (Preliminary result)   Collection Time: 02/03/22 12:16 PM   Specimen: BLOOD  Result Value Ref Range Status   Specimen Description BLOOD RIGHT ANTECUBITAL  Final   Special Requests   Final    BOTTLES DRAWN AEROBIC AND ANAEROBIC Blood Culture adequate volume   Culture   Final    NO GROWTH < 24 HOURS Performed at [facility], [address], [place], [place] [address]  Report Status PENDING  Incomplete  Resp Panel by RT-PCR (Flu A&B, Covid) Nasopharyngeal Swab     Status: None   Collection Time: 02/03/22 12:16 PM   Specimen: Nasopharyngeal Swab; Nasopharyngeal(NP) swabs in vial transport medium  Result Value Ref Range Status   SARS Coronavirus 2 by RT PCR NEGATIVE NEGATIVE Final    Comment: (NOTE) SARS-CoV-2 target nucleic acids are NOT DETECTED.  The SARS-CoV-2 RNA is generally detectable in upper respiratory specimens during the acute phase of infection. The lowest concentration of SARS-CoV-2 viral copies this assay can detect is 138 copies/mL. A negative result does not preclude SARS-Cov-2 infection and should not be used as the sole basis for treatment or other patient management decisions. A negative result may occur with  improper specimen collection/handling, submission of specimen other than nasopharyngeal swab, presence of viral mutation(s) within the areas targeted by this assay, and inadequate number of viral copies(<138 copies/mL). A negative result must be combined with clinical observations, patient history, and  epidemiological information. The expected result is Negative.  Fact Sheet for Patients:  EntrepreneurPulse.com.au  Fact Sheet for Healthcare Providers:  IncredibleEmployment.be  This test is no t yet approved or cleared by the [place] FDA and  has been authorized for detection and/or diagnosis of SARS-CoV-2 by FDA under an Emergency Use Authorization (EUA). This EUA will remain  in effect (meaning this test can be used) for the duration of the COVID-19 declaration under Section 564(b)(1) of the Act, 21 U.S.C.section 360bbb-3(b)(1), unless the authorization is terminated  or revoked sooner.       Influenza A by PCR NEGATIVE NEGATIVE Final   Influenza B by PCR NEGATIVE NEGATIVE Final    Comment: (NOTE) The Xpert Xpress SARS-CoV-2/FLU/RSV plus assay is intended as an aid in the diagnosis of influenza from Nasopharyngeal swab specimens and should not be used as a sole basis for treatment. Nasal washings and aspirates are unacceptable for Xpert Xpress SARS-CoV-2/FLU/RSV testing.  Fact Sheet for Patients: EntrepreneurPulse.com.au  Fact Sheet for Healthcare Providers: IncredibleEmployment.be  This test is not yet approved or cleared by the [place] FDA and has been authorized for detection and/or diagnosis of SARS-CoV-2 by FDA under an Emergency Use Authorization (EUA). This EUA will remain in effect (meaning this test can be used) for the duration of the COVID-19 declaration under Section 564(b)(1) of the Act, 21 U.S.C. section 360bbb-3(b)(1), unless the authorization is terminated or revoked.  Performed at [facility], [place], [place] [address]   Respiratory (~20 pathogens) panel by PCR     Status: None   Collection Time: 02/03/22 12:20 PM   Specimen: Nasopharyngeal Swab; Respiratory  Result Value Ref Range Status   Adenovirus NOT DETECTED NOT DETECTED Final    Coronavirus 229E NOT DETECTED NOT DETECTED Final    Comment: (NOTE) The Coronavirus on the Respiratory Panel, DOES NOT test for the novel  Coronavirus (2019 nCoV)    Coronavirus HKU1 NOT DETECTED NOT DETECTED Final   Coronavirus NL63 NOT DETECTED NOT DETECTED Final   Coronavirus OC43 NOT DETECTED NOT DETECTED Final   Metapneumovirus NOT DETECTED NOT DETECTED Final   Rhinovirus / Enterovirus NOT DETECTED NOT DETECTED Final   Influenza A NOT DETECTED NOT DETECTED Final   Influenza B NOT DETECTED NOT DETECTED Final   Parainfluenza Virus 1 NOT DETECTED NOT DETECTED Final   Parainfluenza Virus 2 NOT DETECTED NOT DETECTED Final   Parainfluenza Virus 3 NOT DETECTED NOT DETECTED Final   Parainfluenza Virus 4 NOT  DETECTED NOT DETECTED Final   Respiratory Syncytial Virus NOT DETECTED NOT DETECTED Final   Bordetella pertussis NOT DETECTED NOT DETECTED Final   Bordetella Parapertussis NOT DETECTED NOT DETECTED Final   Chlamydophila pneumoniae NOT DETECTED NOT DETECTED Final   Mycoplasma pneumoniae NOT DETECTED NOT DETECTED Final    Comment: Performed at [facility], [place] [address] [place], [place] [address]  Expectorated Sputum Assessment w Gram Stain, Rflx to Resp Cult     Status: None   Collection Time: 02/03/22  6:29 PM   Specimen: Sputum  Result Value Ref Range Status   Specimen Description SPUTUM  Final   Special Requests NONE  Final   Sputum evaluation   Final    Sputum specimen not acceptable for testing.  Please recollect.   CANDACE PARKER @0 :32 02/04/2022 TTG Performed at [facility], [address] [place], [place] [address]    Report Status 02/04/2022 FINAL  Final     Radiology Studies: CT ABDOMEN PELVIS WO CONTRAST  Result Date: 02/03/2022 CLINICAL DATA:  Abdominal pain and distension since yesterday. EXAM: CT ABDOMEN AND PELVIS WITHOUT CONTRAST TECHNIQUE: Multidetector CT imaging of the abdomen and pelvis was performed following the standard protocol without  IV contrast. RADIATION DOSE REDUCTION: This exam was performed according to the departmental dose-optimization program which includes automated exposure control, adjustment of the mA and/or kV according to patient size and/or use of iterative reconstruction technique. COMPARISON:  01/13/2010. FINDINGS: Lower chest: No acute abnormality. Hepatobiliary: Liver mildly enlarged and diffusely decreased in attenuation. No liver mass or focal lesion. Gallbladder surgically absent. No bile duct dilation. Pancreas: Peripancreatic inflammation that tracks along the gastrohepatic and gastro duodenal ligaments and anterior pararenal fascia, right greater than left. Trace amount of associated ascites. No discrete fluid collection. Spleen: Normal in size without focal abnormality. Adrenals/Urinary Tract: Adrenal glands are unremarkable. Kidneys are normal, without renal calculi, focal lesion, or hydronephrosis. Bladder is unremarkable. Stomach/Bowel: Stomach distended, otherwise unremarkable. Duodenal wall is thickened with adjacent inflammation, that is most likely reactive to adjacent pancreatic inflammation. Small bowel and colon are normal in caliber. No wall thickening. No other areas of inflammation. Normal appendix visualized. Vascular/Lymphatic: No vascular abnormality. No enlarged lymph nodes. Reproductive: Unremarkable. Other: None. Musculoskeletal: No fracture or acute finding.  No bone lesion. IMPRESSION: 1. Findings consistent with acute pancreatitis, with significant peripancreatic inflammatory change and a trace amount of ascites. No discrete acute peripancreatic fluid collection. 2. Wall thickening and inflammation of the duodenum, which is most likely reactive. Consider duodenitis if there are in consistent clinical findings for pancreatitis. 3. No other acute abnormality within the abdomen or pelvis. 4. Mild hepatomegaly with extensive hepatic steatosis. Electronically Signed   By: Lajean Manes M.D.   On:  02/03/2022 13:23   DG Chest 2 View  Result Date: 02/03/2022 CLINICAL DATA:  61 year old male with history of shortness of breath. EXAM: CHEST - 2 VIEW COMPARISON:  Chest x-ray 02/17/2010. FINDINGS: Lung volumes are low. Patchy ill-defined opacities and areas of interstitial prominence are noted in the mid to lower lungs bilaterally where there is also diffuse peribronchial cuffing. No pleural effusions. No pneumothorax. No evidence of pulmonary edema. Heart size is normal. Upper mediastinal contours are within normal limits. IMPRESSION: 1. The appearance the chest is concerning for bronchitis and developing multilobar bilateral bronchopneumonia. Electronically Signed   By: Vinnie Langton M.D.   On: 02/03/2022 11:55    Scheduled Meds:  atorvastatin  40 mg Oral q1800   Chlorhexidine  Gluconate Cloth  6 each Topical Daily   cholecalciferol  2,000 Units Oral Daily   enoxaparin (LOVENOX) injection  0.5 mg/kg Subcutaneous Q24H   fenofibrate  160 mg Oral Daily   Continuous Infusions:  azithromycin Stopped (02/03/22 2026)   dextrose 125 mL/hr at 02/04/22 1020   insulin 0.1 Units/kg/hr (02/04/22 0500)   piperacillin-tazobactam (ZOSYN)  IV 3.375 g (02/04/22 0631)   potassium chloride 10 mEq (02/04/22 1128)    sodium bicarbonate (isotonic) infusion in sterile water 150 mL/hr at 02/04/22 0500     LOS: 1 day   Shelly Coss, MD [facility] P2/26/2023, 11:38 AM

## 2022-02-04 NOTE — Progress Notes (Signed)
Reported to [place] and [place] RN re PICC ready to use and to remove all PIV's in RUE and only leave the LA PIV if absolutely necessary for medication infusions.  LA PIV placed by [facility] by [facility] Team RN.

## 2022-02-04 NOTE — Assessment & Plan Note (Addendum)
Resolved

## 2022-02-04 NOTE — Consult Note (Signed)
PHARMACY CONSULT NOTE - FOLLOW UP  Pharmacy Consult for Electrolyte Monitoring and Replacement   Recent Labs: Potassium (mmol/L)  Date Value  02/04/2022 3.7   Magnesium (mg/dL)  Date Value  02/04/2022 2.3   Calcium (mg/dL)  Date Value  02/04/2022 7.0 (L)   Albumin (g/dL)  Date Value  02/03/2022 2.6 (L)  07/26/2017 4.3   Phosphorus (mg/dL)  Date Value  02/04/2022 2.8   Sodium (mmol/L)  Date Value  02/04/2022 134 (L)  07/26/2017 138    Assessment: Patient admitted with pancreatitis, pancreatic pseudocyst, and sepsis. TG ar 4250 on admission and patient was placed on insulin gtt. Pharmacy was consulted to manage electrolytes.  Noted potassium improved but remains below 4 while patient remains on insulin gtt. Corrected calcium at 8.44 today and corrected this morning.   Goal of Therapy:  Electrolytes within  normal limits  Plan:  Order Kcl 74meq q1hr x 2 doses. Follow up electrolytes q4h thru 2/26 and morning labs and replace as needed.  Kelis Plasse Rodriguez-Guzman PharmD, BCPS 02/04/2022 8:54 PM

## 2022-02-04 NOTE — ED Notes (Signed)
This RN transported the patient to unit monitored - ICU RN present at the bedside to assist with transport. VSS; Pt A&Ox4.

## 2022-02-05 LAB — CBC WITH DIFFERENTIAL/PLATELET
Abs Immature Granulocytes: 0.06 10*3/uL (ref 0.00–0.07)
Basophils Absolute: 0.1 10*3/uL (ref 0.0–0.1)
Basophils Relative: 1 %
Eosinophils Absolute: 0.3 10*3/uL (ref 0.0–0.5)
Eosinophils Relative: 3 %
HCT: 34.2 % — ABNORMAL LOW (ref 39.0–52.0)
Hemoglobin: 11.2 g/dL — ABNORMAL LOW (ref 13.0–17.0)
Immature Granulocytes: 1 %
Lymphocytes Relative: 6 %
Lymphs Abs: 0.5 10*3/uL — ABNORMAL LOW (ref 0.7–4.0)
MCH: 28.4 pg (ref 26.0–34.0)
MCHC: 32.7 g/dL (ref 30.0–36.0)
MCV: 86.6 fL (ref 80.0–100.0)
Monocytes Absolute: 0.5 10*3/uL (ref 0.1–1.0)
Monocytes Relative: 5 %
Neutro Abs: 8.2 10*3/uL — ABNORMAL HIGH (ref 1.7–7.7)
Neutrophils Relative %: 84 %
Platelets: 186 10*3/uL (ref 150–400)
RBC: 3.95 MIL/uL — ABNORMAL LOW (ref 4.22–5.81)
RDW: 15.8 % — ABNORMAL HIGH (ref 11.5–15.5)
Smear Review: NORMAL
WBC Morphology: INCREASED
WBC: 9.7 10*3/uL (ref 4.0–10.5)
nRBC: 0 % (ref 0.0–0.2)

## 2022-02-05 LAB — TRIGLYCERIDES
Triglycerides: 1130 mg/dL — ABNORMAL HIGH (ref <150)
Triglycerides: 1264 mg/dL — ABNORMAL HIGH (ref <150)
Triglycerides: 1287 mg/dL — ABNORMAL HIGH (ref <150)
Triglycerides: 1287 mg/dL — ABNORMAL HIGH (ref <150)
Triglycerides: 1549 mg/dL — ABNORMAL HIGH (ref <150)

## 2022-02-05 LAB — GLUCOSE, CAPILLARY
Glucose-Capillary: 101 mg/dL — ABNORMAL HIGH (ref 70–99)
Glucose-Capillary: 102 mg/dL — ABNORMAL HIGH (ref 70–99)
Glucose-Capillary: 105 mg/dL — ABNORMAL HIGH (ref 70–99)
Glucose-Capillary: 105 mg/dL — ABNORMAL HIGH (ref 70–99)
Glucose-Capillary: 108 mg/dL — ABNORMAL HIGH (ref 70–99)
Glucose-Capillary: 113 mg/dL — ABNORMAL HIGH (ref 70–99)
Glucose-Capillary: 129 mg/dL — ABNORMAL HIGH (ref 70–99)
Glucose-Capillary: 150 mg/dL — ABNORMAL HIGH (ref 70–99)
Glucose-Capillary: 79 mg/dL (ref 70–99)
Glucose-Capillary: 85 mg/dL (ref 70–99)
Glucose-Capillary: 88 mg/dL (ref 70–99)
Glucose-Capillary: 92 mg/dL (ref 70–99)
Glucose-Capillary: 92 mg/dL (ref 70–99)
Glucose-Capillary: 93 mg/dL (ref 70–99)
Glucose-Capillary: 94 mg/dL (ref 70–99)
Glucose-Capillary: 95 mg/dL (ref 70–99)
Glucose-Capillary: 95 mg/dL (ref 70–99)
Glucose-Capillary: 96 mg/dL (ref 70–99)
Glucose-Capillary: 96 mg/dL (ref 70–99)
Glucose-Capillary: 97 mg/dL (ref 70–99)
Glucose-Capillary: 98 mg/dL (ref 70–99)
Glucose-Capillary: 99 mg/dL (ref 70–99)

## 2022-02-05 LAB — BASIC METABOLIC PANEL
Anion gap: 7 (ref 5–15)
BUN: 13 mg/dL (ref 6–20)
CO2: 22 mmol/L (ref 22–32)
Calcium: 7.2 mg/dL — ABNORMAL LOW (ref 8.9–10.3)
Chloride: 101 mmol/L (ref 98–111)
Creatinine, Ser: 0.66 mg/dL (ref 0.61–1.24)
GFR, Estimated: >60 mL/min (ref >60)
Glucose, Bld: 96 mg/dL (ref 70–99)
Potassium: 3.8 mmol/L (ref 3.5–5.1)
Sodium: 130 mmol/L — ABNORMAL LOW (ref 135–145)

## 2022-02-05 LAB — PROCALCITONIN: Procalcitonin: 4.5 ng/mL

## 2022-02-05 MED ORDER — OXYCODONE HCL 5 MG PO TABS
5.0000 mg | ORAL_TABLET | Freq: Four times a day (QID) | ORAL | Status: DC | PRN
Start: 2022-02-05 — End: 2022-02-12
  Administered 2022-02-05 – 2022-02-12 (×13): 5 mg via ORAL
  Filled 2022-02-05 (×13): qty 1

## 2022-02-05 MED ORDER — INSULIN (MYXREDLIN) INFUSION FOR HYPERTRIGLYCERIDEMIA
4.0000 [IU]/h | INTRAVENOUS | Status: DC
  Administered 2022-02-05 – 2022-02-06 (×2): 4 [IU]/h via INTRAVENOUS
  Filled 2022-02-05: qty 100

## 2022-02-05 MED ORDER — MELATONIN 5 MG PO TABS
5.0000 mg | ORAL_TABLET | Freq: Every evening | ORAL | Status: DC | PRN
  Administered 2022-02-05 – 2022-02-11 (×5): 5 mg via ORAL
  Filled 2022-02-05 (×5): qty 1

## 2022-02-05 MED ORDER — OXYCODONE HCL 5 MG PO TABS
5.0000 mg | ORAL_TABLET | Freq: Once | ORAL | Status: AC
  Administered 2022-02-05: 5 mg via ORAL
  Filled 2022-02-05: qty 1

## 2022-02-05 MED ORDER — HYDROMORPHONE HCL 1 MG/ML IJ SOLN
0.5000 mg | INTRAMUSCULAR | Status: DC | PRN
  Administered 2022-02-05 – 2022-02-06 (×3): 0.5 mg via INTRAVENOUS
  Filled 2022-02-05 (×3): qty 1

## 2022-02-05 NOTE — Consult Note (Signed)
[facility] for Electrolyte Monitoring and Replacement   Recent Labs: Potassium (mmol/L)  Date Value  02/05/2022 PENDING   Magnesium (mg/dL)  Date Value  02/04/2022 2.3   Calcium (mg/dL)  Date Value  02/05/2022 7.2 (L)   Albumin (g/dL)  Date Value  02/03/2022 2.6 (L)  07/26/2017 4.3   Phosphorus (mg/dL)  Date Value  02/04/2022 2.8   Sodium (mmol/L)  Date Value  02/05/2022 130 (L)  07/26/2017 138   Corrected Ca: 8.3 mg/dL  Assessment: Patient admitted with pancreatitis, pancreatic pseudocyst, and sepsis. TG ar 4250 on admission and patient was placed on insulin gtt. Pharmacy was consulted to manage electrolytes. Lab has had equipment issues limiting availability of some results  Goal of Therapy:  Electrolytes within  normal limits  Plan:  Recheck potassium at 1600  Follow up electrolytes q4h thru 2/26 and morning labs and replace as needed.  Vallery Sa, PharmD, BCPS Clinical Pharmacist 02/05/2022

## 2022-02-05 NOTE — TOC Initial Note (Signed)
Transition of Care [facility]) - Initial/Assessment Note    Patient Details  Name: CODEE TUTSON MRN: [id number] Date of Birth: 07/02/1961  Transition of Care [facility]) CM/SW Contact:    Shelbie Hutching, RN Phone Number: 02/05/2022, 11:24 AM  Clinical Narrative:                   Transition of Care [facility]) Screening Note   Patient Details  Name: NYSHAUN STANDAGE Date of Birth: 1961-10-17   Transition of Care [facility]) CM/SW Contact:    Shelbie Hutching, RN Phone Number: 02/05/2022, 11:24 AM    Transition of Care Department [facility]) has reviewed patient and no TOC needs have been identified at this time. We will continue to monitor patient advancement through interdisciplinary progression rounds. If new patient transition needs arise, please place a TOC consult.    Expected Discharge Plan: Home/Self Care Barriers to Discharge: Continued Medical Work up   Patient Goals and CMS Choice        Expected Discharge Plan and Services Expected Discharge Plan: Home/Self Care                                              Prior Living Arrangements/Services                       Activities of Daily Living Home Assistive Devices/Equipment: None ADL Screening (condition at time of admission) Patient's cognitive ability adequate to safely complete daily activities?: Yes Is the patient deaf or have difficulty hearing?: No Does the patient have difficulty seeing, even when wearing glasses/contacts?: No Does the patient have difficulty concentrating, remembering, or making decisions?: No Patient able to express need for assistance with ADLs?: Yes Does the patient have difficulty dressing or bathing?: No Independently performs ADLs?: Yes (appropriate for developmental age) Does the patient have difficulty walking or climbing stairs?: No Weakness of Legs: None Weakness of Arms/Hands: None  Permission Sought/Granted                  Emotional Assessment               Admission diagnosis:  Lactic acidosis [E87.20] Acute recurrent pancreatitis [K85.90] SIRS (systemic inflammatory response syndrome) ([place]) [R65.10] Acute pancreatitis without infection or necrosis, unspecified pancreatitis type [K85.90] Patient Active Problem List   Diagnosis Date Noted   Acute recurrent pancreatitis 02/03/2022   Severe sepsis ([place]) 02/03/2022   Diabetes mellitus without complication ([place]) 96/75/9163   HTN (hypertension) 02/03/2022   AKI (acute kidney injury) ([place]) 02/03/2022   Lactic acidosis 02/03/2022   Acute bronchiolitis 02/03/2022   Vitamin D deficiency 02/12/2021   Hypertension, uncontrolled 02/10/2021   Pain in left knee 06/06/2020   Allergic rhinitis 01/17/2020   Arthritis of right acromioclavicular joint 12/12/2017   Hypertriglyceridemia 10/25/2017   Rotator cuff tendinitis, right 10/23/2017   Encounter for well adult exam with abnormal findings 10/11/2017   Right shoulder pain 10/11/2017   Fatigue 08/07/2017   Mass of foot 06/08/2016   Actinic keratoses 11/17/2012   Elbow stiffness 11/17/2012   Abnormal EKG 09/18/2011   HYPOGONADISM 02/16/2011   SINUSITIS, ACUTE 02/16/2011   Sunburn 02/16/2011   SNORING 02/16/2011   OSTEOPENIA 12/15/2010   NECK PAIN 11/15/2010   LOW BACK PAIN, ACUTE 11/15/2010   ARM PAIN 11/15/2010   PARESTHESIA 11/15/2010   FATTY LIVER  DISEASE 10/27/2010   LIVER FUNCTION TESTS, ABNORMAL, HX OF 10/27/2010   DEPRESSION/ANXIETY 08/25/2010   B12 deficiency 04/28/2010   CHOLELITHIASIS 01/13/2010   PANCREATITIS, ACUTE 12/29/2009   PSEUDOCYST, PANCREAS 12/29/2009   Diabetes ([place]) 12/08/2009   Hyperlipidemia 12/08/2009   PANCREATITIS, HX OF 12/08/2009   PCP:  Biagio Borg, MD Pharmacy:   [id number] [place], [place] [address] Phone: [phone] Fax: [phone]  [facility] [place] [place] [facility]  [employer] [address] [place] [address] Phone: [phone] Fax: [phone]  [facility] [place], [place] [address] Phone: [phone] Fax: [phone]     Social Determinants of Health (SDOH) Interventions    Readmission Risk Interventions No flowsheet data found.

## 2022-02-05 NOTE — Progress Notes (Signed)
PROGRESS NOTE  Kevin Miller  EZM:[id number] DOB: February 19, 1961 DOA: 02/03/2022 PCP: Biagio Borg, MD   Brief Narrative: Patient is a 61 year old male with history of pancreatitis, pancreatic pseudocyst, hyperlipidemia, hypertriglyceridemia, hypertension, diabetes type 2, hypogonadism who presented with abdominal pain.  No history of alcohol use.  On presentation he was hypotensive so was started on IV fluids.  Lab work showed lipase level 637, creatinine of 1.4, leukocytosis.  Triglyceride level found in the range of 4000.  Patient was admitted for the management of pancreatitis secondary to hypertriglyceridemia.  Started on insulin drip.   Assessment & Plan:  Principal Problem:   Acute recurrent pancreatitis Active Problems:   Hyperlipidemia   Hypertriglyceridemia   Severe sepsis (HCC)   Diabetes mellitus without complication (HCC)   HTN (hypertension)   AKI (acute kidney injury) ([place])   Lactic acidosis   Acute bronchiolitis   Assessment and Plan: * Acute recurrent pancreatitis- (present on admission) Has history of pancreatitis, pancreatic pseudocyst.  CT abdomen/pelvis showed acute pancreatitis with significant peripancreatic inflammatory change, trace amount of ascites.  No discrete acute peripancreatic fluid collection.  Also showed thickening and inflammation of duodenum most likely reactive.  Pancreatitis secondary to hypertriglyceridemia.  Acute bronchiolitis- (present on admission) Now at 2 L of oxygen.  Chest x-ray showed features of bronchitis/developing multilobular bilateral pneumonia.  Continue current antibiotics: On Zosyn, azithromycin.    Lactic acidosis- (present on admission) Resolved with IV fluids  AKI (acute kidney injury) ([place])- (present on admission) Continue IV fluids.  Monitor kidney function.  Losartan on hold  HTN (hypertension)- (present on admission) Hypertensive on presentation.  Takes Cozaar, amlodipine, metoprolol at home.  Currently these  medications are on hold.  Diabetes mellitus without complication ([place]) Takes glipizide, Farxiga at home.  No evidence of DKA.  Continue current insulin regimen. Patient is currently hypoglycemic because he is on insulin drip.  Continue Current D10 infusion.  Avoid hypoglycemia  Severe sepsis ([place])- (present on admission) Presented with hypotension, leukocytosis, AKI.  Follow-up cultures.  Elevated procalcitonin.  Currently hemodynamically stable.  Continue current antibiotics  Hypertriglyceridemia- (present on admission) Root problem for pancreatitis.  Triglyceride level was in the range of 4000 on admission.  Started on insulin drip.  We will continue insulin drip until triglyceride level comes around 1000  Hyperlipidemia- (present on admission) On Lipitor and fenofibrate   Obesity: BMI of 33.9         DVT prophylaxis:     Code Status: Full Code  Family Communication: wife at bedside on 2/26  Patient status:Inpatient  Patient is from :Home  Anticipated discharge TK:PTWS  Estimated DC date:in 2-3 days   Consultants: None  Procedures:None  Antimicrobials:  Anti-infectives (From admission, onward)    Start     Dose/Rate Route Frequency Ordered Stop   02/03/22 2100  piperacillin-tazobactam (ZOSYN) IVPB 3.375 g        3.375 g 12.5 mL/hr over 240 Minutes Intravenous Every 8 hours 02/03/22 1754     02/03/22 1800  azithromycin (ZITHROMAX) 500 mg in sodium chloride 0.9 % 250 mL IVPB        500 mg 250 mL/hr over 60 Minutes Intravenous Every 24 hours 02/03/22 1738     02/03/22 1200  metroNIDAZOLE (FLAGYL) IVPB 500 mg        500 mg 100 mL/hr over 60 Minutes Intravenous  Once 02/03/22 1146 02/03/22 1336   02/03/22 1200  ceFEPIme (MAXIPIME) 2 g in sodium chloride 0.9 % 100 mL IVPB  2 g 200 mL/hr over 30 Minutes Intravenous  Once 02/03/22 1149 02/03/22 1256       Subjective: Patient seen and examined the bedside this morning.  Hemodynamically stable.  He feels  better today.  Abdomen pain is better.  Abdomen is distended, passing gas, no bowel movement yet.  Remains on insulin drip.  Objective: Vitals:   02/05/22 0700 02/05/22 0720 02/05/22 0800 02/05/22 0900  BP: (!) 141/62  140/70 122/64  Pulse: (!) 104  (!) 112 (!) 105  Resp: 18     Temp:  98.5 F (36.9 C)    TempSrc:  Axillary    SpO2: 94%  93% (!) 87%  Weight:      Height:        Intake/Output Summary (Last 24 hours) at 02/05/2022 1206 Last data filed at 02/05/2022 1000 Gross per 24 hour  Intake 5461.57 ml  Output 2175 ml  Net 3286.57 ml   Filed Weights   02/03/22 1107  Weight: 113.4 kg    Examination:   General exam: Overall comfortable, not in distress,obese HEENT: PERRL Respiratory system:  no wheezes or crackles  Cardiovascular system: Sinus tach.  Gastrointestinal system: Abdomen is distended, soft and tender inepigastric region,bowel sounds present Central nervous system: Alert and oriented Extremities: No edema, no clubbing ,no cyanosis Skin: No rashes, no ulcers,no icterus    Data Reviewed: I have personally reviewed following labs and imaging studies  CBC: Recent Labs  Lab 02/03/22 1109 02/04/22 0301 02/05/22 0607  WBC 17.7* 12.5* 9.7  NEUTROABS  --   --  8.2*  HGB 17.2* 12.1* 11.2*  HCT 48.5 35.5* 34.2*  MCV 85.4 85.7 86.6  PLT 362 228 270   Basic Metabolic Panel: Recent Labs  Lab 02/03/22 1911 02/03/22 2223 02/04/22 0301 02/04/22 0639 02/04/22 1036 02/04/22 1437 02/04/22 2016  NA 132*   < > 134* 132* 134* 134* 132*  K 5.2*   < > 3.8 3.7 3.4* 3.7 3.8  CL 104   < > 101 102 98 96* 98  CO2 16*   < > 21* 22 24 24 24   GLUCOSE 203*   < > 56* 68* 70 98 88  BUN 22*   < > 23* 26* 23* 21* 17  CREATININE 1.31*   < > 1.39* 1.28* 1.15 1.12 0.82  CALCIUM 6.5*   < > 6.6* 6.5* 6.7* 7.0* 7.2*  MG 1.8  --  2.3  --   --   --   --   PHOS 2.7  --  2.8  --   --   --   --    < > = values in this interval not displayed.     Recent Results (from the past  240 hour(s))  Blood Culture (routine x 2)     Status: None (Preliminary result)   Collection Time: 02/03/22 12:16 PM   Specimen: BLOOD  Result Value Ref Range Status   Specimen Description BLOOD BLOOD RIGHT WRIST  Final   Special Requests   Final    BOTTLES DRAWN AEROBIC AND ANAEROBIC Blood Culture adequate volume   Culture   Final    NO GROWTH 2 DAYS Performed at [facility] [address] [place], [place] [address]    Report Status PENDING  Incomplete  Blood Culture (routine x 2)     Status: None (Preliminary result)   Collection Time: 02/03/22 12:16 PM   Specimen: BLOOD  Result Value Ref Range Status   Specimen Description BLOOD  RIGHT ANTECUBITAL  Final   Special Requests   Final    BOTTLES DRAWN AEROBIC AND ANAEROBIC Blood Culture adequate volume   Culture   Final    NO GROWTH 2 DAYS Performed at [facility], [address] [place], [place] [address]    Report Status PENDING  Incomplete  Resp Panel by RT-PCR (Flu A&B, Covid) Nasopharyngeal Swab     Status: None   Collection Time: 02/03/22 12:16 PM   Specimen: Nasopharyngeal Swab; Nasopharyngeal(NP) swabs in vial transport medium  Result Value Ref Range Status   SARS Coronavirus 2 by RT PCR NEGATIVE NEGATIVE Final    Comment: (NOTE) SARS-CoV-2 target nucleic acids are NOT DETECTED.  The SARS-CoV-2 RNA is generally detectable in upper respiratory specimens during the acute phase of infection. The lowest concentration of SARS-CoV-2 viral copies this assay can detect is 138 copies/mL. A negative result does not preclude SARS-Cov-2 infection and should not be used as the sole basis for treatment or other patient management decisions. A negative result may occur with  improper specimen collection/handling, submission of specimen other than nasopharyngeal swab, presence of viral mutation(s) within the areas targeted by this assay, and inadequate number of viral copies(<138 copies/mL). A negative result  must be combined with clinical observations, patient history, and epidemiological information. The expected result is Negative.  Fact Sheet for Patients:  EntrepreneurPulse.com.au  Fact Sheet for Healthcare Providers:  IncredibleEmployment.be  This test is no t yet approved or cleared by the [place] FDA and  has been authorized for detection and/or diagnosis of SARS-CoV-2 by FDA under an Emergency Use Authorization (EUA). This EUA will remain  in effect (meaning this test can be used) for the duration of the COVID-19 declaration under Section 564(b)(1) of the Act, 21 U.S.C.section 360bbb-3(b)(1), unless the authorization is terminated  or revoked sooner.       Influenza A by PCR NEGATIVE NEGATIVE Final   Influenza B by PCR NEGATIVE NEGATIVE Final    Comment: (NOTE) The Xpert Xpress SARS-CoV-2/FLU/RSV plus assay is intended as an aid in the diagnosis of influenza from Nasopharyngeal swab specimens and should not be used as a sole basis for treatment. Nasal washings and aspirates are unacceptable for Xpert Xpress SARS-CoV-2/FLU/RSV testing.  Fact Sheet for Patients: EntrepreneurPulse.com.au  Fact Sheet for Healthcare Providers: IncredibleEmployment.be  This test is not yet approved or cleared by the [place] FDA and has been authorized for detection and/or diagnosis of SARS-CoV-2 by FDA under an Emergency Use Authorization (EUA). This EUA will remain in effect (meaning this test can be used) for the duration of the COVID-19 declaration under Section 564(b)(1) of the Act, 21 U.S.C. section 360bbb-3(b)(1), unless the authorization is terminated or revoked.  Performed at [facility], [place],  [address]   Respiratory (~20 pathogens) panel by PCR     Status: None   Collection Time: 02/03/22 12:20 PM   Specimen: Nasopharyngeal Swab; Respiratory  Result Value Ref  Range Status   Adenovirus NOT DETECTED NOT DETECTED Final   Coronavirus 229E NOT DETECTED NOT DETECTED Final    Comment: (NOTE) The Coronavirus on the Respiratory Panel, DOES NOT test for the novel  Coronavirus (2019 nCoV)    Coronavirus HKU1 NOT DETECTED NOT DETECTED Final   Coronavirus NL63 NOT DETECTED NOT DETECTED Final   Coronavirus OC43 NOT DETECTED NOT DETECTED Final   Metapneumovirus NOT DETECTED NOT DETECTED Final   Rhinovirus / Enterovirus NOT DETECTED NOT DETECTED Final   Influenza  A NOT DETECTED NOT DETECTED Final   Influenza B NOT DETECTED NOT DETECTED Final   Parainfluenza Virus 1 NOT DETECTED NOT DETECTED Final   Parainfluenza Virus 2 NOT DETECTED NOT DETECTED Final   Parainfluenza Virus 3 NOT DETECTED NOT DETECTED Final   Parainfluenza Virus 4 NOT DETECTED NOT DETECTED Final   Respiratory Syncytial Virus NOT DETECTED NOT DETECTED Final   Bordetella pertussis NOT DETECTED NOT DETECTED Final   Bordetella Parapertussis NOT DETECTED NOT DETECTED Final   Chlamydophila pneumoniae NOT DETECTED NOT DETECTED Final   Mycoplasma pneumoniae NOT DETECTED NOT DETECTED Final    Comment: Performed at [facility], [place] [address], [place], [place] [address]  Expectorated Sputum Assessment w Gram Stain, Rflx to Resp Cult     Status: None   Collection Time: 02/03/22  6:29 PM   Specimen: Sputum  Result Value Ref Range Status   Specimen Description SPUTUM  Final   Special Requests NONE  Final   Sputum evaluation   Final    Sputum specimen not acceptable for testing.  Please recollect.   CANDACE PARKER @0 :32 02/04/2022 TTG Performed at [facility], [address], [place],  [address]    Report Status 02/04/2022 FINAL  Final     Radiology Studies: CT ABDOMEN PELVIS WO CONTRAST  Result Date: 02/03/2022 CLINICAL DATA:  Abdominal pain and distension since yesterday. EXAM: CT ABDOMEN AND PELVIS WITHOUT CONTRAST TECHNIQUE: Multidetector CT imaging of the abdomen  and pelvis was performed following the standard protocol without IV contrast. RADIATION DOSE REDUCTION: This exam was performed according to the departmental dose-optimization program which includes automated exposure control, adjustment of the mA and/or kV according to patient size and/or use of iterative reconstruction technique. COMPARISON:  01/13/2010. FINDINGS: Lower chest: No acute abnormality. Hepatobiliary: Liver mildly enlarged and diffusely decreased in attenuation. No liver mass or focal lesion. Gallbladder surgically absent. No bile duct dilation. Pancreas: Peripancreatic inflammation that tracks along the gastrohepatic and gastro duodenal ligaments and anterior pararenal fascia, right greater than left. Trace amount of associated ascites. No discrete fluid collection. Spleen: Normal in size without focal abnormality. Adrenals/Urinary Tract: Adrenal glands are unremarkable. Kidneys are normal, without renal calculi, focal lesion, or hydronephrosis. Bladder is unremarkable. Stomach/Bowel: Stomach distended, otherwise unremarkable. Duodenal wall is thickened with adjacent inflammation, that is most likely reactive to adjacent pancreatic inflammation. Small bowel and colon are normal in caliber. No wall thickening. No other areas of inflammation. Normal appendix visualized. Vascular/Lymphatic: No vascular abnormality. No enlarged lymph nodes. Reproductive: Unremarkable. Other: None. Musculoskeletal: No fracture or acute finding.  No bone lesion. IMPRESSION: 1. Findings consistent with acute pancreatitis, with significant peripancreatic inflammatory change and a trace amount of ascites. No discrete acute peripancreatic fluid collection. 2. Wall thickening and inflammation of the duodenum, which is most likely reactive. Consider duodenitis if there are in consistent clinical findings for pancreatitis. 3. No other acute abnormality within the abdomen or pelvis. 4. Mild hepatomegaly with extensive hepatic  steatosis. Electronically Signed   By: Lajean Manes M.D.   On: 02/03/2022 13:23   [place] EKG SITE RITE  Result Date: 02/04/2022 If [facility] image not attached, placement could not be confirmed due to current cardiac rhythm.   Scheduled Meds:  atorvastatin  40 mg Oral q1800   Chlorhexidine Gluconate Cloth  6 each Topical Daily   cholecalciferol  2,000 Units Oral Daily   enoxaparin (LOVENOX) injection  0.5 mg/kg Subcutaneous Q24H   fenofibrate  160 mg Oral Daily  metoprolol tartrate  12.5 mg Oral BID   sodium chloride flush  10-40 mL Intracatheter Q12H   Continuous Infusions:  azithromycin Stopped (02/03/22 2026)   dextrose 75 mL/hr at 02/05/22 1000   insulin 4 Units/hr (02/05/22 1103)   lactated ringers 125 mL/hr at 02/05/22 1000   piperacillin-tazobactam (ZOSYN)  IV 12.5 mL/hr at 02/05/22 1000     LOS: 2 days   Shelly Coss, MD [facility] P2/27/2023, 12:06 PM

## 2022-02-06 ENCOUNTER — Inpatient Hospital Stay: Payer: 59

## 2022-02-06 LAB — CBC WITH DIFFERENTIAL/PLATELET
Abs Immature Granulocytes: 0.14 10*3/uL — ABNORMAL HIGH (ref 0.00–0.07)
Basophils Absolute: 0.1 10*3/uL (ref 0.0–0.1)
Basophils Relative: 1 %
Eosinophils Absolute: 0.5 10*3/uL (ref 0.0–0.5)
Eosinophils Relative: 4 %
HCT: 33.5 % — ABNORMAL LOW (ref 39.0–52.0)
Hemoglobin: 10.8 g/dL — ABNORMAL LOW (ref 13.0–17.0)
Immature Granulocytes: 1 %
Lymphocytes Relative: 6 %
Lymphs Abs: 0.6 10*3/uL — ABNORMAL LOW (ref 0.7–4.0)
MCH: 28.1 pg (ref 26.0–34.0)
MCHC: 32.2 g/dL (ref 30.0–36.0)
MCV: 87 fL (ref 80.0–100.0)
Monocytes Absolute: 0.9 10*3/uL (ref 0.1–1.0)
Monocytes Relative: 9 %
Neutro Abs: 8.3 10*3/uL — ABNORMAL HIGH (ref 1.7–7.7)
Neutrophils Relative %: 79 %
Platelets: 195 10*3/uL (ref 150–400)
RBC: 3.85 MIL/uL — ABNORMAL LOW (ref 4.22–5.81)
RDW: 15.9 % — ABNORMAL HIGH (ref 11.5–15.5)
WBC: 10.5 10*3/uL (ref 4.0–10.5)
nRBC: 0 % (ref 0.0–0.2)

## 2022-02-06 LAB — GLUCOSE, CAPILLARY
Glucose-Capillary: 100 mg/dL — ABNORMAL HIGH (ref 70–99)
Glucose-Capillary: 124 mg/dL — ABNORMAL HIGH (ref 70–99)
Glucose-Capillary: 85 mg/dL (ref 70–99)
Glucose-Capillary: 87 mg/dL (ref 70–99)
Glucose-Capillary: 90 mg/dL (ref 70–99)
Glucose-Capillary: 91 mg/dL (ref 70–99)
Glucose-Capillary: 92 mg/dL (ref 70–99)
Glucose-Capillary: 92 mg/dL (ref 70–99)
Glucose-Capillary: 96 mg/dL (ref 70–99)
Glucose-Capillary: 98 mg/dL (ref 70–99)
Glucose-Capillary: 99 mg/dL (ref 70–99)

## 2022-02-06 LAB — BASIC METABOLIC PANEL
Anion gap: 10 (ref 5–15)
Anion gap: 7 (ref 5–15)
BUN: 17 mg/dL (ref 6–20)
BUN: 11 mg/dL (ref 6–20)
CO2: 24 mmol/L (ref 22–32)
CO2: 26 mmol/L (ref 22–32)
Calcium: 7.2 mg/dL — ABNORMAL LOW (ref 8.9–10.3)
Calcium: 8.1 mg/dL — ABNORMAL LOW (ref 8.9–10.3)
Chloride: 98 mmol/L (ref 98–111)
Chloride: 102 mmol/L (ref 98–111)
Creatinine, Ser: 0.82 mg/dL (ref 0.61–1.24)
Creatinine, Ser: 0.69 mg/dL (ref 0.61–1.24)
GFR, Estimated: >60 mL/min (ref >60)
GFR, Estimated: >60 mL/min (ref >60)
Glucose, Bld: 106 mg/dL — ABNORMAL HIGH (ref 70–99)
Glucose, Bld: 100 mg/dL — ABNORMAL HIGH (ref 70–99)
Potassium: 3.8 mmol/L (ref 3.5–5.1)
Potassium: 3.7 mmol/L (ref 3.5–5.1)
Sodium: 132 mmol/L — ABNORMAL LOW (ref 135–145)
Sodium: 135 mmol/L (ref 135–145)

## 2022-02-06 LAB — TRIGLYCERIDES: Triglycerides: 469 mg/dL — ABNORMAL HIGH (ref <150)

## 2022-02-06 LAB — LEGIONELLA PNEUMOPHILA SEROGP 1 UR AG: L. pneumophila Serogp 1 Ur Ag: NEGATIVE

## 2022-02-06 MED ORDER — ATORVASTATIN CALCIUM 20 MG PO TABS
80.0000 mg | ORAL_TABLET | Freq: Every day | ORAL | Status: DC
  Administered 2022-02-06 – 2022-02-11 (×6): 80 mg via ORAL
  Filled 2022-02-06 (×6): qty 4

## 2022-02-06 MED ORDER — METOPROLOL TARTRATE 50 MG PO TABS
50.0000 mg | ORAL_TABLET | Freq: Two times a day (BID) | ORAL | Status: DC
  Administered 2022-02-06 – 2022-02-08 (×5): 50 mg via ORAL
  Filled 2022-02-06 (×5): qty 1

## 2022-02-06 MED ORDER — AMLODIPINE BESYLATE 5 MG PO TABS
5.0000 mg | ORAL_TABLET | Freq: Every day | ORAL | Status: DC
Start: 2022-02-06 — End: 2022-02-08
  Administered 2022-02-06 – 2022-02-07 (×2): 5 mg via ORAL
  Filled 2022-02-06 (×2): qty 1

## 2022-02-06 NOTE — Progress Notes (Signed)
Report given to RN for patient to transfer to room 113 girlfriend of patient at bedside patient transported via Wheelchair with IV pole

## 2022-02-06 NOTE — Plan of Care (Signed)
°  Problem: Education: Goal: Knowledge of General Education information will improve Description: Including pain rating scale, medication(s)/side effects and non-pharmacologic comfort measures Outcome: Progressing   Problem: Health Behavior/Discharge Planning: Goal: Ability to manage health-related needs will improve Outcome: Progressing   Problem: Clinical Measurements: Goal: Ability to maintain clinical measurements within normal limits will improve Outcome: Progressing Goal: Will remain free from infection Outcome: Progressing Goal: Diagnostic test results will improve Outcome: Progressing Goal: Respiratory complications will improve Outcome: Progressing Goal: Cardiovascular complication will be avoided Outcome: Progressing   Problem: Activity: Goal: Risk for activity intolerance will decrease Outcome: Progressing   Problem: Nutrition: Goal: Adequate nutrition will be maintained Outcome: Progressing   Problem: Coping: Goal: Level of anxiety will decrease Outcome: Progressing   Problem: Elimination: Goal: Will not experience complications related to bowel motility Outcome: Progressing Goal: Will not experience complications related to urinary retention Outcome: Progressing   Problem: Pain Managment: Goal: General experience of comfort will improve Outcome: Progressing   Problem: Safety: Goal: Ability to remain free from injury will improve Outcome: Progressing   Problem: Skin Integrity: Goal: Risk for impaired skin integrity will decrease Outcome: Progressing Patient able to ambulate self in room decrease in pain medication patient off insulin gtt waiting for bed out of stepdown at this time

## 2022-02-06 NOTE — Consult Note (Signed)
[place] for Electrolyte Monitoring and Replacement   Recent Labs: Potassium (mmol/L)  Date Value  02/06/2022 3.7   Magnesium (mg/dL)  Date Value  02/04/2022 2.3   Calcium (mg/dL)  Date Value  02/06/2022 8.1 (L)   Albumin (g/dL)  Date Value  02/03/2022 2.6 (L)  07/26/2017 4.3   Phosphorus (mg/dL)  Date Value  02/04/2022 2.8   Sodium (mmol/L)  Date Value  02/06/2022 135  07/26/2017 138   Corrected Ca: 8.3 mg/dL  Assessment: Patient admitted with pancreatitis, pancreatic pseudocyst, and sepsis. TG ar 4250 on admission and patient was placed on insulin gtt. Pharmacy was consulted to manage electrolytes. Lab has had equipment issues limiting availability of some results  On insulin gtt for Trig Trig: 4250>2486>1287>469 Ins gtt rate: 0.1 >0.04>0.05 un/k/h >> 4 un/h > OFF on 2/27 PM  Goal of Therapy:  Electrolytes within  normal limits  Plan:  Lytes remain WNL and Ins gtt remains off overnight. Renal fxn and gluc stable. Triglyerides continue to improve. No repletion warranted at this time CTM and adjust PRN with AM labs  Lorna Dibble, PharmD, [facility] Clinical Pharmacist 02/06/2022 8:56 AM

## 2022-02-06 NOTE — Progress Notes (Signed)
PROGRESS NOTE  Kevin Miller  VVO:[id number] DOB: January 17, 1961 DOA: 02/03/2022 PCP: Biagio Borg, MD   Brief Narrative: Patient is a 61 year old male with history of pancreatitis, pancreatic pseudocyst, hyperlipidemia, hypertriglyceridemia, hypertension, diabetes type 2, hypogonadism who presented with abdominal pain.  No history of alcohol use.  On presentation he was hypotensive so was started on IV fluids.  Lab work showed lipase level 637, creatinine of 1.4, leukocytosis.  Triglyceride level found in the range of 4000.  Patient was admitted for the management of pancreatitis secondary to hypertriglyceridemia.  Started on insulin drip.  Triglyceride level has significantly improved with insulin drip, drip discontinued.  Hospital course remarkable for persistent abdominal discomfort   Assessment & Plan:  Principal Problem:   Acute recurrent pancreatitis Active Problems:   Hyperlipidemia   Hypertriglyceridemia   Severe sepsis (HCC)   Diabetes mellitus without complication (HCC)   HTN (hypertension)   AKI (acute kidney injury) ([place])   Lactic acidosis   Acute bronchiolitis   Assessment and Plan: * Acute recurrent pancreatitis- (present on admission) Has history of pancreatitis, pancreatic pseudocyst.  CT abdomen/pelvis showed acute pancreatitis with significant peripancreatic inflammatory change, trace amount of ascites.  No discrete acute peripancreatic fluid collection.  Also showed thickening and inflammation of duodenum most likely reactive.  Pancreatitis secondary to hypertriglyceridemia. Continues to complain of abdominal discomfort, abdomen remains distended.  Passing gas, has slow bowel sounds, had a small bowel movement yesterday.  We will check abdominal x-ray  Acute bronchiolitis- (present on admission) Now at 2 L of oxygen.  Chest x-ray showed features of bronchitis/developing multilobular bilateral pneumonia.  Continue current antibiotics: On Zosyn, azithromycin.     Lactic acidosis- (present on admission) Resolved with IV fluids  AKI (acute kidney injury) ([place])- (present on admission) Resolved. Losartan on hold  HTN (hypertension)- (present on admission) Hypertensive on presentation.  Takes Cozaar, amlodipine, metoprolol at home.  Restarted metoprolol,amlod.  Diabetes mellitus without complication ([place]) Takes glipizide, Farxiga at home.  No evidence of DKA.    Severe sepsis ([place])- (present on admission) Presented with hypotension, leukocytosis, AKI.  Follow-up cultures. NGTD. Elevated procalcitonin.  Currently hemodynamically stable.  Continue current antibiotics  Hypertriglyceridemia- (present on admission) Root problem for pancreatitis.  Triglyceride level was in the range of 4000 on admission.  Started on insulin drip with significant improvement.  Insulin drip was stopped.  Continue high-dose Lipitor, fenofibrate  Hyperlipidemia- (present on admission) On Lipitor and fenofibrate   Obesity: BMI of 33.9         DVT prophylaxis:     Code Status: Full Code  Family Communication: daughter at bedside on 2/28  Patient status:Inpatient  Patient is from :Home  Anticipated discharge YI:RSWN  Estimated DC date:in 2-3 days   Consultants: None  Procedures:None  Antimicrobials:  Anti-infectives (From admission, onward)    Start     Dose/Rate Route Frequency Ordered Stop   02/03/22 2100  piperacillin-tazobactam (ZOSYN) IVPB 3.375 g        3.375 g 12.5 mL/hr over 240 Minutes Intravenous Every 8 hours 02/03/22 1754     02/03/22 1800  azithromycin (ZITHROMAX) 500 mg in sodium chloride 0.9 % 250 mL IVPB        500 mg 250 mL/hr over 60 Minutes Intravenous Every 24 hours 02/03/22 1738     02/03/22 1200  metroNIDAZOLE (FLAGYL) IVPB 500 mg        500 mg 100 mL/hr over 60 Minutes Intravenous  Once 02/03/22 1146 02/03/22 1336   02/03/22  1200  ceFEPIme (MAXIPIME) 2 g in sodium chloride 0.9 % 100 mL IVPB        2 g 200 mL/hr over 30  Minutes Intravenous  Once 02/03/22 1149 02/03/22 1256       Subjective: Patient seen and examined at the bedside this morning.  Insulin drip has been stopped.  His abdomen remains distended and he complains of discomfort.  Had a small bowel movement yesterday.  Passing gas.  Continues to remain n.p.o.  Objective: Vitals:   02/06/22 0700 02/06/22 0734 02/06/22 0800 02/06/22 0813  BP: (!) 157/83  (!) 162/84   Pulse: (!) 101     Resp: (!) 22  17 (!) 21  Temp:  98.4 F (36.9 C)    TempSrc:  Oral    SpO2: 91%     Weight:      Height:        Intake/Output Summary (Last 24 hours) at 02/06/2022 1311 Last data filed at 02/06/2022 1230 Gross per 24 hour  Intake 5052.11 ml  Output 1975 ml  Net 3077.11 ml   Filed Weights   02/03/22 1107  Weight: 113.4 kg    Examination:   General exam: Overall comfortable, not in distress, obese HEENT: PERRL Respiratory system:  no wheezes or crackles  Cardiovascular system: Sinus tachycardia.  Gastrointestinal system: Abdomen is distended, mild generalized tenderness mainly on the epigastric region, slow bowel sounds. Central nervous system: Alert and oriented Extremities: No edema, no clubbing ,no cyanosis Skin: No rashes, no ulcers,no icterus    Data Reviewed: I have personally reviewed following labs and imaging studies  CBC: Recent Labs  Lab 02/03/22 1109 02/04/22 0301 02/05/22 0607 02/06/22 0435  WBC 17.7* 12.5* 9.7 10.5  NEUTROABS  --   --  8.2* 8.3*  HGB 17.2* 12.1* 11.2* 10.8*  HCT 48.5 35.5* 34.2* 33.5*  MCV 85.4 85.7 86.6 87.0  PLT 362 228 186 559   Basic Metabolic Panel: Recent Labs  Lab 02/03/22 1911 02/03/22 2223 02/04/22 0301 02/04/22 0639 02/04/22 1036 02/04/22 1437 02/04/22 2016 02/05/22 0607 02/06/22 0435  NA 132*   < > 134*   < > 134* 134* 132* 130* 135  K 5.2*   < > 3.8   < > 3.4* 3.7 3.8 3.8 3.7  CL 104   < > 101   < > 98 96* 98 101 102  CO2 16*   < > 21*   < > 24 24 24 22 26   GLUCOSE 203*   < > 56*    < > 70 98 88 96 100*  BUN 22*   < > 23*   < > 23* 21* 17 13 11   CREATININE 1.31*   < > 1.39*   < > 1.15 1.12 0.82 0.66 0.69  CALCIUM 6.5*   < > 6.6*   < > 6.7* 7.0* 7.2* 7.2* 8.1*  MG 1.8  --  2.3  --   --   --   --   --   --   PHOS 2.7  --  2.8  --   --   --   --   --   --    < > = values in this interval not displayed.     Recent Results (from the past 240 hour(s))  Blood Culture (routine x 2)     Status: None (Preliminary result)   Collection Time: 02/03/22 12:16 PM   Specimen: BLOOD  Result Value Ref Range Status   Specimen Description  BLOOD BLOOD RIGHT WRIST  Final   Special Requests   Final    BOTTLES DRAWN AEROBIC AND ANAEROBIC Blood Culture adequate volume   Culture   Final    NO GROWTH 2 DAYS Performed at [facility], [address] [place], [place] [address]    Report Status PENDING  Incomplete  Blood Culture (routine x 2)     Status: None (Preliminary result)   Collection Time: 02/03/22 12:16 PM   Specimen: BLOOD  Result Value Ref Range Status   Specimen Description BLOOD RIGHT ANTECUBITAL  Final   Special Requests   Final    BOTTLES DRAWN AEROBIC AND ANAEROBIC Blood Culture adequate volume   Culture   Final    NO GROWTH 2 DAYS Performed at [facility], [address] [place], [place] [address]    Report Status PENDING  Incomplete  Resp Panel by RT-PCR (Flu A&B, Covid) Nasopharyngeal Swab     Status: None   Collection Time: 02/03/22 12:16 PM   Specimen: Nasopharyngeal Swab; Nasopharyngeal(NP) swabs in vial transport medium  Result Value Ref Range Status   SARS Coronavirus 2 by RT PCR NEGATIVE NEGATIVE Final    Comment: (NOTE) SARS-CoV-2 target nucleic acids are NOT DETECTED.  The SARS-CoV-2 RNA is generally detectable in upper respiratory specimens during the acute phase of infection. The lowest concentration of SARS-CoV-2 viral copies this assay can detect is 138 copies/mL. A negative result does not preclude SARS-Cov-2 infection  and should not be used as the sole basis for treatment or other patient management decisions. A negative result may occur with  improper specimen collection/handling, submission of specimen other than nasopharyngeal swab, presence of viral mutation(s) within the areas targeted by this assay, and inadequate number of viral copies(<138 copies/mL). A negative result must be combined with clinical observations, patient history, and epidemiological information. The expected result is Negative.  Fact Sheet for Patients:  EntrepreneurPulse.com.au  Fact Sheet for Healthcare Providers:  IncredibleEmployment.be  This test is no t yet approved or cleared by the [place] FDA and  has been authorized for detection and/or diagnosis of SARS-CoV-2 by FDA under an Emergency Use Authorization (EUA). This EUA will remain  in effect (meaning this test can be used) for the duration of the COVID-19 declaration under Section 564(b)(1) of the Act, 21 U.S.C.section 360bbb-3(b)(1), unless the authorization is terminated  or revoked sooner.       Influenza A by PCR NEGATIVE NEGATIVE Final   Influenza B by PCR NEGATIVE NEGATIVE Final    Comment: (NOTE) The Xpert Xpress SARS-CoV-2/FLU/RSV plus assay is intended as an aid in the diagnosis of influenza from Nasopharyngeal swab specimens and should not be used as a sole basis for treatment. Nasal washings and aspirates are unacceptable for Xpert Xpress SARS-CoV-2/FLU/RSV testing.  Fact Sheet for Patients: EntrepreneurPulse.com.au  Fact Sheet for Healthcare Providers: IncredibleEmployment.be  This test is not yet approved or cleared by the [place] FDA and has been authorized for detection and/or diagnosis of SARS-CoV-2 by FDA under an Emergency Use Authorization (EUA). This EUA will remain in effect (meaning this test can be used) for the duration of the COVID-19 declaration  under Section 564(b)(1) of the Act, 21 U.S.C. section 360bbb-3(b)(1), unless the authorization is terminated or revoked.  Performed at [facility], [place], [place] [address]   Respiratory (~20 pathogens) panel by PCR     Status: None   Collection Time: 02/03/22 12:20 PM   Specimen: Nasopharyngeal Swab;  Respiratory  Result Value Ref Range Status   Adenovirus NOT DETECTED NOT DETECTED Final   Coronavirus 229E NOT DETECTED NOT DETECTED Final    Comment: (NOTE) The Coronavirus on the Respiratory Panel, DOES NOT test for the novel  Coronavirus (2019 nCoV)    Coronavirus HKU1 NOT DETECTED NOT DETECTED Final   Coronavirus NL63 NOT DETECTED NOT DETECTED Final   Coronavirus OC43 NOT DETECTED NOT DETECTED Final   Metapneumovirus NOT DETECTED NOT DETECTED Final   Rhinovirus / Enterovirus NOT DETECTED NOT DETECTED Final   Influenza A NOT DETECTED NOT DETECTED Final   Influenza B NOT DETECTED NOT DETECTED Final   Parainfluenza Virus 1 NOT DETECTED NOT DETECTED Final   Parainfluenza Virus 2 NOT DETECTED NOT DETECTED Final   Parainfluenza Virus 3 NOT DETECTED NOT DETECTED Final   Parainfluenza Virus 4 NOT DETECTED NOT DETECTED Final   Respiratory Syncytial Virus NOT DETECTED NOT DETECTED Final   Bordetella pertussis NOT DETECTED NOT DETECTED Final   Bordetella Parapertussis NOT DETECTED NOT DETECTED Final   Chlamydophila pneumoniae NOT DETECTED NOT DETECTED Final   Mycoplasma pneumoniae NOT DETECTED NOT DETECTED Final    Comment: Performed at [facility], [place]. [address] [place], [place] [address]  Expectorated Sputum Assessment w Gram Stain, Rflx to Resp Cult     Status: None   Collection Time: 02/03/22  6:29 PM   Specimen: Sputum  Result Value Ref Range Status   Specimen Description SPUTUM  Final   Special Requests NONE  Final   Sputum evaluation   Final    Sputum specimen not acceptable for testing.  Please recollect.   CANDACE PARKER @0 :32 02/04/2022  TTG Performed at [facility], [address], [place], [place] [address]    Report Status 02/04/2022 FINAL  Final     Radiology Studies: [place] EKG SITE RITE  Result Date: 02/04/2022 If [facility] image not attached, placement could not be confirmed due to current cardiac rhythm.   Scheduled Meds:  amLODipine  5 mg Oral Daily   atorvastatin  80 mg Oral q1800   Chlorhexidine Gluconate Cloth  6 each Topical Daily   cholecalciferol  2,000 Units Oral Daily   enoxaparin (LOVENOX) injection  0.5 mg/kg Subcutaneous Q24H   fenofibrate  160 mg Oral Daily   metoprolol tartrate  50 mg Oral BID   sodium chloride flush  10-40 mL Intracatheter Q12H   Continuous Infusions:  azithromycin Stopped (02/05/22 1907)   dextrose Stopped (02/06/22 1227)   lactated ringers 125 mL/hr at 02/06/22 1100   piperacillin-tazobactam (ZOSYN)  IV 12.5 mL/hr at 02/06/22 1100     LOS: 3 days   Shelly Coss, MD [facility] P2/28/2023, 1:11 PM

## 2022-02-07 LAB — CBC WITH DIFFERENTIAL/PLATELET
Abs Immature Granulocytes: 0.91 10*3/uL — ABNORMAL HIGH (ref 0.00–0.07)
Basophils Absolute: 0 10*3/uL (ref 0.0–0.1)
Basophils Relative: 0 %
Eosinophils Absolute: 0.3 10*3/uL (ref 0.0–0.5)
Eosinophils Relative: 2 %
HCT: 33.2 % — ABNORMAL LOW (ref 39.0–52.0)
Hemoglobin: 10.8 g/dL — ABNORMAL LOW (ref 13.0–17.0)
Immature Granulocytes: 8 %
Lymphocytes Relative: 6 %
Lymphs Abs: 0.6 10*3/uL — ABNORMAL LOW (ref 0.7–4.0)
MCH: 27.9 pg (ref 26.0–34.0)
MCHC: 32.5 g/dL (ref 30.0–36.0)
MCV: 85.8 fL (ref 80.0–100.0)
Monocytes Absolute: 1.4 10*3/uL — ABNORMAL HIGH (ref 0.1–1.0)
Monocytes Relative: 13 %
Neutro Abs: 7.9 10*3/uL — ABNORMAL HIGH (ref 1.7–7.7)
Neutrophils Relative %: 71 %
Platelets: 271 10*3/uL (ref 150–400)
RBC: 3.87 MIL/uL — ABNORMAL LOW (ref 4.22–5.81)
RDW: 16.4 % — ABNORMAL HIGH (ref 11.5–15.5)
Smear Review: NORMAL
WBC: 11.1 10*3/uL — ABNORMAL HIGH (ref 4.0–10.5)
nRBC: 0 % (ref 0.0–0.2)

## 2022-02-07 LAB — BASIC METABOLIC PANEL
Anion gap: 16 — ABNORMAL HIGH (ref 5–15)
BUN: 16 mg/dL (ref 6–20)
CO2: 19 mmol/L — ABNORMAL LOW (ref 22–32)
Calcium: 8.3 mg/dL — ABNORMAL LOW (ref 8.9–10.3)
Chloride: 102 mmol/L (ref 98–111)
Creatinine, Ser: 0.83 mg/dL (ref 0.61–1.24)
GFR, Estimated: >60 mL/min (ref >60)
Glucose, Bld: 150 mg/dL — ABNORMAL HIGH (ref 70–99)
Potassium: 3.9 mmol/L (ref 3.5–5.1)
Sodium: 137 mmol/L (ref 135–145)

## 2022-02-07 LAB — GLUCOSE, CAPILLARY
Glucose-Capillary: 119 mg/dL — ABNORMAL HIGH (ref 70–99)
Glucose-Capillary: 129 mg/dL — ABNORMAL HIGH (ref 70–99)
Glucose-Capillary: 135 mg/dL — ABNORMAL HIGH (ref 70–99)
Glucose-Capillary: 176 mg/dL — ABNORMAL HIGH (ref 70–99)
Glucose-Capillary: 193 mg/dL — ABNORMAL HIGH (ref 70–99)
Glucose-Capillary: 194 mg/dL — ABNORMAL HIGH (ref 70–99)

## 2022-02-07 MED ORDER — LOSARTAN POTASSIUM 50 MG PO TABS
100.0000 mg | ORAL_TABLET | Freq: Every day | ORAL | Status: DC
  Administered 2022-02-07 – 2022-02-12 (×6): 100 mg via ORAL
  Filled 2022-02-07 (×6): qty 2

## 2022-02-07 MED ORDER — AZITHROMYCIN 500 MG PO TABS: 500.0000 mg | ORAL_TABLET | Freq: Once | ORAL | Status: DC

## 2022-02-07 MED ORDER — AMOXICILLIN-POT CLAVULANATE 875-125 MG PO TABS
1.0000 | ORAL_TABLET | Freq: Two times a day (BID) | ORAL | Status: AC
  Administered 2022-02-07 – 2022-02-08 (×4): 1 via ORAL
  Filled 2022-02-07 (×4): qty 1

## 2022-02-07 MED ORDER — INSULIN ASPART 100 UNIT/ML IJ SOLN
0.0000 [IU] | Freq: Every day | INTRAMUSCULAR | Status: DC
  Administered 2022-02-08 – 2022-02-11 (×4): 2 [IU] via SUBCUTANEOUS
  Filled 2022-02-07 (×4): qty 1

## 2022-02-07 MED ORDER — INSULIN ASPART 100 UNIT/ML IJ SOLN
0.0000 [IU] | Freq: Three times a day (TID) | INTRAMUSCULAR | Status: DC
  Administered 2022-02-08: 2 [IU] via SUBCUTANEOUS
  Administered 2022-02-08 – 2022-02-09 (×3): 3 [IU] via SUBCUTANEOUS
  Administered 2022-02-09: 5 [IU] via SUBCUTANEOUS
  Administered 2022-02-09: 3 [IU] via SUBCUTANEOUS
  Administered 2022-02-10: 5 [IU] via SUBCUTANEOUS
  Administered 2022-02-10: 3 [IU] via SUBCUTANEOUS
  Administered 2022-02-10: 8 [IU] via SUBCUTANEOUS
  Administered 2022-02-11: 3 [IU] via SUBCUTANEOUS
  Administered 2022-02-11 – 2022-02-12 (×4): 5 [IU] via SUBCUTANEOUS
  Filled 2022-02-07 (×14): qty 1

## 2022-02-07 NOTE — Progress Notes (Signed)
PHARMACIST - PHYSICIAN COMMUNICATION ? ?CONCERNING: Antibiotic IV to Oral Route Change Policy ? ?RECOMMENDATION: ?This patient is receiving azithromycin by the intravenous route.  Based on criteria approved by the Pharmacy and Therapeutics Committee, the antibiotic(s) is/are being converted to the equivalent oral dose form(s). ? ? ?DESCRIPTION: ?These criteria include: ?Patient being treated for a respiratory tract infection, urinary tract infection, cellulitis or clostridium difficile associated diarrhea if on metronidazole ?The patient is not neutropenic and does not exhibit a GI malabsorption state ?The patient is eating (either orally or via tube) and/or has been taking other orally administered medications for a least 24 hours ?The patient is improving clinically and has a Tmax < 100.5 ? ?If you have questions about this conversion, please contact the Pharmacy Department  ? ?Benita Gutter  ?02/07/22  ?  ?

## 2022-02-07 NOTE — Progress Notes (Signed)
PROGRESS NOTE  Kevin GEISSINGER  KVQ:[id number] DOB: Apr 29, 1961 DOA: 02/03/2022 PCP: Biagio Borg, MD   Brief Narrative: Patient is a 61 year old male with history of pancreatitis, pancreatic pseudocyst, hyperlipidemia, hypertriglyceridemia, hypertension, diabetes type 2, hypogonadism who presented with abdominal pain.  No history of alcohol use.  On presentation he was hypotensive so was started on IV fluids.  Lab work showed lipase level 637, creatinine of 1.4, leukocytosis.  Triglyceride level found in the range of 4000.  Patient was admitted for the management of pancreatitis secondary to hypertriglyceridemia.  Started on insulin drip.  Triglyceride level has significantly improved with insulin drip, drip discontinued.  Hospital course remarkable for persistent abdominal discomfort   Assessment & Plan:  Principal Problem:   Acute recurrent pancreatitis Active Problems:   Hyperlipidemia   Hypertriglyceridemia   Severe sepsis (HCC)   Diabetes mellitus without complication (HCC)   HTN (hypertension)   AKI (acute kidney injury) ([employer])   Lactic acidosis   Acute bronchiolitis   Assessment and Plan: * Acute recurrent pancreatitis- (present on admission) Has history of pancreatitis, pancreatic pseudocyst.  CT abdomen/pelvis showed acute pancreatitis with significant peripancreatic inflammatory change, trace amount of ascites.  No discrete acute peripancreatic fluid collection.  Also showed thickening and inflammation of duodenum most likely reactive.  Pancreatitis secondary to hypertriglyceridemia. Abd xray does not show ileus.  Abdominal distention has improved.  Improved abdominal pain.  He had bowel movements.  Started on clear liquid diet  Acute bronchiolitis- (present on admission) Now on room air  Chest x-ray showed features of bronchitis/developing multilobular bilateral pneumonia.  Antibiotics changed to oral.  Lactic acidosis- (present on admission) Resolved with IV fluids  AKI  (acute kidney injury) ([place])- (present on admission) Resolved.   HTN (hypertension)- (present on admission)  Takes Cozaar, amlodipine, metoprolol at home.  Restarted  Diabetes mellitus without complication ([place]) Takes glipizide, Farxiga at home.  No evidence of DKA.    Severe sepsis ([place])- (present on admission) Presented with hypotension, leukocytosis, AKI.  Follow-up cultures. NGTD. Elevated procalcitonin.  Currently hemodynamically stable.  Continue current antibiotics  Hypertriglyceridemia- (present on admission) Root problem for pancreatitis.  Triglyceride level was in the range of 4000 on admission.  Started on insulin drip with significant improvement.  Insulin drip was stopped.  Continue high-dose Lipitor, fenofibrate  Hyperlipidemia- (present on admission) On Lipitor and fenofibrate   Obesity: BMI of 33.9         DVT prophylaxis:     Code Status: Full Code  Family Communication: daughter at bedside on 2/28  Patient status:Inpatient  Patient is from :Home  Anticipated discharge IE:PPIR  Estimated DC date:in 1-2 days   Consultants: None  Procedures:None  Antimicrobials:  Anti-infectives (From admission, onward)    Start     Dose/Rate Route Frequency Ordered Stop   02/07/22 1800  azithromycin (ZITHROMAX) tablet 500 mg  Status:  Discontinued        500 mg Oral  Once 02/07/22 0736 02/07/22 0852   02/07/22 1000  amoxicillin-clavulanate (AUGMENTIN) 875-125 MG per tablet 1 tablet        1 tablet Oral Every 12 hours 02/07/22 0852 02/09/22 0959   02/03/22 2100  piperacillin-tazobactam (ZOSYN) IVPB 3.375 g  Status:  Discontinued        3.375 g 12.5 mL/hr over 240 Minutes Intravenous Every 8 hours 02/03/22 1754 02/07/22 0852   02/03/22 1800  azithromycin (ZITHROMAX) 500 mg in sodium chloride 0.9 % 250 mL IVPB  Status:  Discontinued  500 mg 250 mL/hr over 60 Minutes Intravenous Every 24 hours 02/03/22 1738 02/07/22 0736   02/03/22 1200  metroNIDAZOLE  (FLAGYL) IVPB 500 mg        500 mg 100 mL/hr over 60 Minutes Intravenous  Once 02/03/22 1146 02/03/22 1336   02/03/22 1200  ceFEPIme (MAXIPIME) 2 g in sodium chloride 0.9 % 100 mL IVPB        2 g 200 mL/hr over 30 Minutes Intravenous  Once 02/03/22 1149 02/03/22 1256       Subjective: Patient seen and examined at the bedside this morning.  Hemodynamically stable.  Looks much better today.  On room air.  He had 1 or 2 bowel movements.  Abdomen pain has improved.  Wants to eat.  Sarted with clear liquid diet  Objective: Vitals:   02/07/22 0032 02/07/22 0154 02/07/22 0432 02/07/22 0721  BP: (!) 141/81  (!) 142/81 (!) 156/91  Pulse: 96  (!) 106 (!) 105  Resp: (!) 24  16 16   Temp: 98 F (36.7 C)  97.7 F (36.5 C) 99.1 F (37.3 C)  TempSrc:      SpO2: 94% 95% 94% 95%  Weight:      Height:        Intake/Output Summary (Last 24 hours) at 02/07/2022 1124 Last data filed at 02/07/2022 0913 Gross per 24 hour  Intake 785.26 ml  Output 2700 ml  Net -1914.74 ml   Filed Weights   02/03/22 1107  Weight: 113.4 kg    Examination:   General exam: Overall comfortable, not in distress, obese HEENT: PERRL Respiratory system:  no wheezes or crackles  Cardiovascular system: Sinus tachycardia.  Gastrointestinal system: Abdomen is remarkably less tender, less firm today.  Good bowel sounds. Central nervous system: Alert and oriented Extremities: No edema, no clubbing ,no cyanosis Skin: No rashes, no ulcers,no icterus    Data Reviewed: I have personally reviewed following labs and imaging studies  CBC: Recent Labs  Lab 02/03/22 1109 02/04/22 0301 02/05/22 0607 02/06/22 0435 02/07/22 0627  WBC 17.7* 12.5* 9.7 10.5 11.1*  NEUTROABS  --   --  8.2* 8.3* 7.9*  HGB 17.2* 12.1* 11.2* 10.8* 10.8*  HCT 48.5 35.5* 34.2* 33.5* 33.2*  MCV 85.4 85.7 86.6 87.0 85.8  PLT 362 228 186 195 295   Basic Metabolic Panel: Recent Labs  Lab 02/03/22 1911 02/03/22 2223 02/04/22 0301 02/04/22 0639  02/04/22 1437 02/04/22 2016 02/05/22 0607 02/06/22 0435 02/07/22 0627  NA 132*   < > 134*   < > 134* 132* 130* 135 137  K 5.2*   < > 3.8   < > 3.7 3.8 3.8 3.7 3.9  CL 104   < > 101   < > 96* 98 101 102 102  CO2 16*   < > 21*   < > 24 24 22 26  19*  GLUCOSE 203*   < > 56*   < > 98 106* 96 100* 150*  BUN 22*   < > 23*   < > 21* 17 13 11 16   CREATININE 1.31*   < > 1.39*   < > 1.12 0.82 0.66 0.69 0.83  CALCIUM 6.5*   < > 6.6*   < > 7.0* 7.2* 7.2* 8.1* 8.3*  MG 1.8  --  2.3  --   --   --   --   --   --   PHOS 2.7  --  2.8  --   --   --   --   --   --    < > =  values in this interval not displayed.     Recent Results (from the past 240 hour(s))  Blood Culture (routine x 2)     Status: None (Preliminary result)   Collection Time: 02/03/22 12:16 PM   Specimen: BLOOD  Result Value Ref Range Status   Specimen Description BLOOD BLOOD RIGHT WRIST  Final   Special Requests   Final    BOTTLES DRAWN AEROBIC AND ANAEROBIC Blood Culture adequate volume   Culture   Final    NO GROWTH 4 DAYS Performed at [facility], [address] [place], [place] [address]    Report Status PENDING  Incomplete  Blood Culture (routine x 2)     Status: None (Preliminary result)   Collection Time: 02/03/22 12:16 PM   Specimen: BLOOD  Result Value Ref Range Status   Specimen Description BLOOD RIGHT ANTECUBITAL  Final   Special Requests   Final    BOTTLES DRAWN AEROBIC AND ANAEROBIC Blood Culture adequate volume   Culture   Final    NO GROWTH 4 DAYS Performed at [facility], [address] [place],  [address]    Report Status PENDING  Incomplete  Resp Panel by RT-PCR (Flu A&B, Covid) Nasopharyngeal Swab     Status: None   Collection Time: 02/03/22 12:16 PM   Specimen: Nasopharyngeal Swab; Nasopharyngeal(NP) swabs in vial transport medium  Result Value Ref Range Status   SARS Coronavirus 2 by RT PCR NEGATIVE NEGATIVE Final    Comment: (NOTE) SARS-CoV-2 target nucleic acids are  NOT DETECTED.  The SARS-CoV-2 RNA is generally detectable in upper respiratory specimens during the acute phase of infection. The lowest concentration of SARS-CoV-2 viral copies this assay can detect is 138 copies/mL. A negative result does not preclude SARS-Cov-2 infection and should not be used as the sole basis for treatment or other patient management decisions. A negative result may occur with  improper specimen collection/handling, submission of specimen other than nasopharyngeal swab, presence of viral mutation(s) within the areas targeted by this assay, and inadequate number of viral copies(<138 copies/mL). A negative result must be combined with clinical observations, patient history, and epidemiological information. The expected result is Negative.  Fact Sheet for Patients:  EntrepreneurPulse.com.au  Fact Sheet for Healthcare Providers:  IncredibleEmployment.be  This test is no t yet approved or cleared by the [place] FDA and  has been authorized for detection and/or diagnosis of SARS-CoV-2 by FDA under an Emergency Use Authorization (EUA). This EUA will remain  in effect (meaning this test can be used) for the duration of the COVID-19 declaration under Section 564(b)(1) of the Act, 21 U.S.C.section 360bbb-3(b)(1), unless the authorization is terminated  or revoked sooner.       Influenza A by PCR NEGATIVE NEGATIVE Final   Influenza B by PCR NEGATIVE NEGATIVE Final    Comment: (NOTE) The Xpert Xpress SARS-CoV-2/FLU/RSV plus assay is intended as an aid in the diagnosis of influenza from Nasopharyngeal swab specimens and should not be used as a sole basis for treatment. Nasal washings and aspirates are unacceptable for Xpert Xpress SARS-CoV-2/FLU/RSV testing.  Fact Sheet for Patients: EntrepreneurPulse.com.au  Fact Sheet for Healthcare Providers: IncredibleEmployment.be  This test is not  yet approved or cleared by the [place] FDA and has been authorized for detection and/or diagnosis of SARS-CoV-2 by FDA under an Emergency Use Authorization (EUA). This EUA will remain in effect (meaning this test can be used) for the duration of the COVID-19 declaration under Section 564(b)(1) of  the Act, 21 U.S.C. section 360bbb-3(b)(1), unless the authorization is terminated or revoked.  Performed at [facility], [place], [place] [address]   Respiratory (~20 pathogens) panel by PCR     Status: None   Collection Time: 02/03/22 12:20 PM   Specimen: Nasopharyngeal Swab; Respiratory  Result Value Ref Range Status   Adenovirus NOT DETECTED NOT DETECTED Final   Coronavirus 229E NOT DETECTED NOT DETECTED Final    Comment: (NOTE) The Coronavirus on the Respiratory Panel, DOES NOT test for the novel  Coronavirus (2019 nCoV)    Coronavirus HKU1 NOT DETECTED NOT DETECTED Final   Coronavirus NL63 NOT DETECTED NOT DETECTED Final   Coronavirus OC43 NOT DETECTED NOT DETECTED Final   Metapneumovirus NOT DETECTED NOT DETECTED Final   Rhinovirus / Enterovirus NOT DETECTED NOT DETECTED Final   Influenza A NOT DETECTED NOT DETECTED Final   Influenza B NOT DETECTED NOT DETECTED Final   Parainfluenza Virus 1 NOT DETECTED NOT DETECTED Final   Parainfluenza Virus 2 NOT DETECTED NOT DETECTED Final   Parainfluenza Virus 3 NOT DETECTED NOT DETECTED Final   Parainfluenza Virus 4 NOT DETECTED NOT DETECTED Final   Respiratory Syncytial Virus NOT DETECTED NOT DETECTED Final   Bordetella pertussis NOT DETECTED NOT DETECTED Final   Bordetella Parapertussis NOT DETECTED NOT DETECTED Final   Chlamydophila pneumoniae NOT DETECTED NOT DETECTED Final   Mycoplasma pneumoniae NOT DETECTED NOT DETECTED Final    Comment: Performed at [facility], [place]. [address] [place], [place] [address]  Expectorated Sputum Assessment w Gram Stain, Rflx to Resp Cult     Status: None    Collection Time: 02/03/22  6:29 PM   Specimen: Sputum  Result Value Ref Range Status   Specimen Description SPUTUM  Final   Special Requests NONE  Final   Sputum evaluation   Final    Sputum specimen not acceptable for testing.  Please recollect.   CANDACE PARKER @0 :32 02/04/2022 TTG Performed at [facility], [place] [place],  [address]    Report Status 02/04/2022 FINAL  Final     Radiology Studies: DG Abd 1 View  Result Date: 02/06/2022 CLINICAL DATA:  Abdominal pain EXAM: ABDOMEN - 1 VIEW COMPARISON:  CT abdomen 02/03/2022 FINDINGS: The bowel gas pattern is normal. No radio-opaque calculi or other significant radiographic abnormality are seen. IMPRESSION: Negative. Electronically Signed   By: Kathreen Devoid M.D.   On: 02/06/2022 13:56    Scheduled Meds:  amLODipine  5 mg Oral Daily   amoxicillin-clavulanate  1 tablet Oral Q12H   atorvastatin  80 mg Oral q1800   Chlorhexidine Gluconate Cloth  6 each Topical Daily   cholecalciferol  2,000 Units Oral Daily   enoxaparin (LOVENOX) injection  0.5 mg/kg Subcutaneous Q24H   fenofibrate  160 mg Oral Daily   losartan  100 mg Oral Daily   metoprolol tartrate  50 mg Oral BID   sodium chloride flush  10-40 mL Intracatheter Q12H   Continuous Infusions:  lactated ringers 75 mL/hr at 02/07/22 0911     LOS: 4 days   Shelly Coss, MD [facility] P3/12/2021, 11:24 AM

## 2022-02-07 NOTE — Consult Note (Signed)
PHARMACY CONSULT NOTE ? ?Pharmacy Consult for Electrolyte Monitoring and Replacement  ? ?Recent Labs: ?Potassium (mmol/L)  ?Date Value  ?02/07/2022 3.9  ? ?Magnesium (mg/dL)  ?Date Value  ?02/04/2022 2.3  ? ?Calcium (mg/dL)  ?Date Value  ?02/07/2022 8.3 (L)  ? ?Albumin (g/dL)  ?Date Value  ?02/03/2022 2.6 (L)  ?07/26/2017 4.3  ? ?Phosphorus (mg/dL)  ?Date Value  ?02/04/2022 2.8  ? ?Sodium (mmol/L)  ?Date Value  ?02/07/2022 137  ?07/26/2017 138  ? ?Assessment: ?Patient admitted with pancreatitis, pancreatic pseudocyst, and sepsis. TG ar 4250 on admission and patient was placed on insulin gtt. Pharmacy was consulted to manage electrolytes. Lab has had equipment issues limiting availability of some results ? ?Completed insulin gtt for hypertriglyceridemia ?Trig: 4250>2486>1287>469 ? ?Goal of Therapy:  ?Electrolytes within  normal limits ? ?Plan:  ?--Electrolytes within normal limits. No electrolyte replacement indicated at this time ?--Will discontinue electrolyte consult at this time. Defer further ordering of labs and electrolyte replacement to primary team ?--Pharmacy will continue to monitor peripherally ? ?Benita Gutter  ?02/07/2022 7:30 AM ? ? ? ?

## 2022-02-08 ENCOUNTER — Inpatient Hospital Stay: Payer: 59

## 2022-02-08 LAB — BASIC METABOLIC PANEL
Anion gap: 11 (ref 5–15)
BUN: 15 mg/dL (ref 6–20)
CO2: 24 mmol/L (ref 22–32)
Calcium: 8.6 mg/dL — ABNORMAL LOW (ref 8.9–10.3)
Chloride: 101 mmol/L (ref 98–111)
Creatinine, Ser: 0.77 mg/dL (ref 0.61–1.24)
GFR, Estimated: >60 mL/min (ref >60)
Glucose, Bld: 161 mg/dL — ABNORMAL HIGH (ref 70–99)
Potassium: 3.5 mmol/L (ref 3.5–5.1)
Sodium: 136 mmol/L (ref 135–145)

## 2022-02-08 LAB — GLUCOSE, CAPILLARY
Glucose-Capillary: 142 mg/dL — ABNORMAL HIGH (ref 70–99)
Glucose-Capillary: 156 mg/dL — ABNORMAL HIGH (ref 70–99)
Glucose-Capillary: 157 mg/dL — ABNORMAL HIGH (ref 70–99)
Glucose-Capillary: 200 mg/dL — ABNORMAL HIGH (ref 70–99)
Glucose-Capillary: 170 mg/dL — ABNORMAL HIGH (ref 70–99)
Glucose-Capillary: 223 mg/dL — ABNORMAL HIGH (ref 70–99)

## 2022-02-08 LAB — CULTURE, BLOOD (ROUTINE X 2)
Culture: NO GROWTH
Culture: NO GROWTH
Special Requests: ADEQUATE
Special Requests: ADEQUATE

## 2022-02-08 MED ORDER — IPRATROPIUM-ALBUTEROL 0.5-2.5 (3) MG/3ML IN SOLN
3.0000 mL | Freq: Four times a day (QID) | RESPIRATORY_TRACT | Status: DC | PRN
  Administered 2022-02-09: 3 mL via RESPIRATORY_TRACT
  Filled 2022-02-08: qty 3

## 2022-02-08 MED ORDER — AMLODIPINE BESYLATE 10 MG PO TABS
10.0000 mg | ORAL_TABLET | Freq: Every day | ORAL | Status: DC
  Administered 2022-02-08 – 2022-02-12 (×5): 10 mg via ORAL
  Filled 2022-02-08 (×5): qty 1

## 2022-02-08 NOTE — Progress Notes (Signed)
PROGRESS NOTE  Kevin Miller  HWE:[id number] DOB: [birth year] DOA: 02/03/2022 PCP: Biagio Borg, MD   Brief Narrative: Patient is a 61 year old male with history of pancreatitis, pancreatic pseudocyst, hyperlipidemia, hypertriglyceridemia, hypertension, diabetes type 2, hypogonadism who presented with abdominal pain.  No history of alcohol use.  On presentation he was hypotensive so was started on IV fluids.  Lab work showed lipase level 637, creatinine of 1.4, leukocytosis.  Triglyceride level found in the range of 4000.  Patient was admitted for the management of pancreatitis secondary to hypertriglyceridemia.  Started on insulin drip.  Triglyceride level has significantly improved with insulin drip, drip discontinued.  Hospital course remarkable for persistent abdominal discomfort,now improving.  Diet advanced to full liquid.  Plan for discharge home tomorrow   Assessment & Plan:  Principal Problem:   Acute recurrent pancreatitis Active Problems:   Hyperlipidemia   Hypertriglyceridemia   Severe sepsis (HCC)   Diabetes mellitus without complication (HCC)   HTN (hypertension)   AKI (acute kidney injury) ([place])   Lactic acidosis   Acute bronchiolitis   Assessment and Plan: * Acute recurrent pancreatitis- (present on admission) Has history of pancreatitis, pancreatic pseudocyst.  CT abdomen/pelvis showed acute pancreatitis with significant peripancreatic inflammatory change, trace amount of ascites.  No discrete acute peripancreatic fluid collection.  Also showed thickening and inflammation of duodenum most likely reactive.  Pancreatitis secondary to hypertriglyceridemia. Abd xray does not show ileus.  Abdominal distention has improved.  Improved abdominal pain.  He had bowel movements.  Diet advanced to full  Acute bronchiolitis- (present on admission) Now on room air  Chest x-ray showed features of bronchitis/developing multilobular bilateral pneumonia.  Antibiotics changed to  oral.  Lactic acidosis- (present on admission) Resolved with IV fluids  AKI (acute kidney injury) ([place])- (present on admission) Resolved.   HTN (hypertension)- (present on admission)  Takes Cozaar, amlodipine, metoprolol at home.  Restarted, remains mildly hypertensive.  Increased the dose of amlodipine to 10 mg daily  Diabetes mellitus without complication ([place]) Takes glipizide, Farxiga at home.  No evidence of DKA.    Severe sepsis ([place])- (present on admission) Presented with hypotension, leukocytosis, AKI.  Follow-up cultures. NGTD. Elevated procalcitonin.  Currently hemodynamically stable.  Continue current antibiotics  Hypertriglyceridemia- (present on admission) Root problem for pancreatitis.  Triglyceride level was in the range of 4000 on admission.  Started on insulin drip with significant improvement.  Insulin drip was stopped.  Continue high-dose Lipitor, fenofibrate.  He needs to follow-up with lipid clinic as an outpatient  Hyperlipidemia- (present on admission) On Lipitor and fenofibrate   Obesity: BMI of 33.9         DVT prophylaxis:     Code Status: Full Code  Family Communication: daughter at bedside on 2/28  Patient status:Inpatient  Patient is from :Home  Anticipated discharge EL:FYBO  Estimated DC date: tomorrow   Consultants: None  Procedures:None  Antimicrobials:  Anti-infectives (From admission, onward)    Start     Dose/Rate Route Frequency Ordered Stop   02/07/22 1800  azithromycin (ZITHROMAX) tablet 500 mg  Status:  Discontinued        500 mg Oral  Once 02/07/22 0736 02/07/22 0852   02/07/22 1000  amoxicillin-clavulanate (AUGMENTIN) 875-125 MG per tablet 1 tablet        1 tablet Oral Every 12 hours 02/07/22 0852 02/09/22 0959   02/03/22 2100  piperacillin-tazobactam (ZOSYN) IVPB 3.375 g  Status:  Discontinued        3.375 g 12.5  mL/hr over 240 Minutes Intravenous Every 8 hours 02/03/22 1754 02/07/22 0852   02/03/22 1800   azithromycin (ZITHROMAX) 500 mg in sodium chloride 0.9 % 250 mL IVPB  Status:  Discontinued        500 mg 250 mL/hr over 60 Minutes Intravenous Every 24 hours 02/03/22 1738 02/07/22 0736   02/03/22 1200  metroNIDAZOLE (FLAGYL) IVPB 500 mg        500 mg 100 mL/hr over 60 Minutes Intravenous  Once 02/03/22 1146 02/03/22 1336   02/03/22 1200  ceFEPIme (MAXIPIME) 2 g in sodium chloride 0.9 % 100 mL IVPB        2 g 200 mL/hr over 30 Minutes Intravenous  Once 02/03/22 1149 02/03/22 1256       Subjective:  Patient seen and examined at the bedside this morning.  Hemodynamically stable.  Mildly hypertensive.  Complains of some back pain today.  Abdomen remains distended but not tender.  He is eager to advance the diet to full liquid  Objective: Vitals:   02/07/22 2018 02/08/22 0028 02/08/22 0444 02/08/22 0732  BP: (!) 168/86 (!) 159/83 (!) 152/90 (!) 154/91  Pulse: (!) 105 99 (!) 105 (!) 101  Resp: 20 20 17 20   Temp: 98.6 F (37 C) 98 F (36.7 C) (!) 97.5 F (36.4 C) 98.6 F (37 C)  TempSrc: Oral Oral Oral   SpO2: 95% 92% 93% 93%  Weight:      Height:        Intake/Output Summary (Last 24 hours) at 02/08/2022 1028 Last data filed at 02/08/2022 0949 Gross per 24 hour  Intake 600 ml  Output 2000 ml  Net -1400 ml   Filed Weights   02/03/22 1107  Weight: 113.4 kg    Examination:   General exam: Overall comfortable, not in distress HEENT: PERRL Respiratory system:  no wheezes or crackles  Cardiovascular system: S1 & S2 heard, RRR.  Gastrointestinal system: Abdomen is distended, soft and nontender. Central nervous system: Alert and oriented Extremities: No edema, no clubbing ,no cyanosis Skin: No rashes, no ulcers,no icterus    Data Reviewed: I have personally reviewed following labs and imaging studies  CBC: Recent Labs  Lab 02/03/22 1109 02/04/22 0301 02/05/22 0607 02/06/22 0435 02/07/22 0627  WBC 17.7* 12.5* 9.7 10.5 11.1*  NEUTROABS  --   --  8.2* 8.3* 7.9*  HGB  17.2* 12.1* 11.2* 10.8* 10.8*  HCT 48.5 35.5* 34.2* 33.5* 33.2*  MCV 85.4 85.7 86.6 87.0 85.8  PLT 362 228 186 195 637   Basic Metabolic Panel: Recent Labs  Lab 02/03/22 1911 02/03/22 2223 02/04/22 0301 02/04/22 0639 02/04/22 2016 02/05/22 0607 02/06/22 0435 02/07/22 0627 02/08/22 0631  NA 132*   < > 134*   < > 132* 130* 135 137 136  K 5.2*   < > 3.8   < > 3.8 3.8 3.7 3.9 3.5  CL 104   < > 101   < > 98 101 102 102 101  CO2 16*   < > 21*   < > 24 22 26  19* 24  GLUCOSE 203*   < > 56*   < > 106* 96 100* 150* 161*  BUN 22*   < > 23*   < > 17 13 11 16 15   CREATININE 1.31*   < > 1.39*   < > 0.82 0.66 0.69 0.83 0.77  CALCIUM 6.5*   < > 6.6*   < > 7.2* 7.2* 8.1* 8.3* 8.6*  MG 1.8  --  2.3  --   --   --   --   --   --   PHOS 2.7  --  2.8  --   --   --   --   --   --    < > = values in this interval not displayed.     Recent Results (from the past 240 hour(s))  Blood Culture (routine x 2)     Status: None   Collection Time: 02/03/22 12:16 PM   Specimen: BLOOD  Result Value Ref Range Status   Specimen Description BLOOD BLOOD RIGHT WRIST  Final   Special Requests   Final    BOTTLES DRAWN AEROBIC AND ANAEROBIC Blood Culture adequate volume   Culture   Final    NO GROWTH 5 DAYS Performed at [facility], [address], [place], [place] [address]    Report Status 02/08/2022 FINAL  Final  Blood Culture (routine x 2)     Status: None   Collection Time: 02/03/22 12:16 PM   Specimen: BLOOD  Result Value Ref Range Status   Specimen Description BLOOD RIGHT ANTECUBITAL  Final   Special Requests   Final    BOTTLES DRAWN AEROBIC AND ANAEROBIC Blood Culture adequate volume   Culture   Final    NO GROWTH 5 DAYS Performed at [facility], [address] [place], [place] [address]    Report Status 02/08/2022 FINAL  Final  Resp Panel by RT-PCR (Flu A&B, Covid) Nasopharyngeal Swab     Status: None   Collection Time: 02/03/22 12:16 PM   Specimen: Nasopharyngeal Swab;  Nasopharyngeal(NP) swabs in vial transport medium  Result Value Ref Range Status   SARS Coronavirus 2 by RT PCR NEGATIVE NEGATIVE Final    Comment: (NOTE) SARS-CoV-2 target nucleic acids are NOT DETECTED.  The SARS-CoV-2 RNA is generally detectable in upper respiratory specimens during the acute phase of infection. The lowest concentration of SARS-CoV-2 viral copies this assay can detect is 138 copies/mL. A negative result does not preclude SARS-Cov-2 infection and should not be used as the sole basis for treatment or other patient management decisions. A negative result may occur with  improper specimen collection/handling, submission of specimen other than nasopharyngeal swab, presence of viral mutation(s) within the areas targeted by this assay, and inadequate number of viral copies(<138 copies/mL). A negative result must be combined with clinical observations, patient history, and epidemiological information. The expected result is Negative.  Fact Sheet for Patients:  EntrepreneurPulse.com.au  Fact Sheet for Healthcare Providers:  IncredibleEmployment.be  This test is no t yet approved or cleared by the [place] FDA and  has been authorized for detection and/or diagnosis of SARS-CoV-2 by FDA under an Emergency Use Authorization (EUA). This EUA will remain  in effect (meaning this test can be used) for the duration of the COVID-19 declaration under Section 564(b)(1) of the Act, 21 U.S.C.section 360bbb-3(b)(1), unless the authorization is terminated  or revoked sooner.       Influenza A by PCR NEGATIVE NEGATIVE Final   Influenza B by PCR NEGATIVE NEGATIVE Final    Comment: (NOTE) The Xpert Xpress SARS-CoV-2/FLU/RSV plus assay is intended as an aid in the diagnosis of influenza from Nasopharyngeal swab specimens and should not be used as a sole basis for treatment. Nasal washings and aspirates are unacceptable for Xpert Xpress  SARS-CoV-2/FLU/RSV testing.  Fact Sheet for Patients: EntrepreneurPulse.com.au  Fact Sheet for Healthcare Providers: IncredibleEmployment.be  This test is not yet approved or  cleared by the [place] and has been authorized for detection and/or diagnosis of SARS-CoV-2 by FDA under an Emergency Use Authorization (EUA). This EUA will remain in effect (meaning this test can be used) for the duration of the COVID-19 declaration under Section 564(b)(1) of the Act, 21 U.S.C. section 360bbb-3(b)(1), unless the authorization is terminated or revoked.  Performed at [facility], [place], [place] [address]   Respiratory (~20 pathogens) panel by PCR     Status: None   Collection Time: 02/03/22 12:20 PM   Specimen: Nasopharyngeal Swab; Respiratory  Result Value Ref Range Status   Adenovirus NOT DETECTED NOT DETECTED Final   Coronavirus 229E NOT DETECTED NOT DETECTED Final    Comment: (NOTE) The Coronavirus on the Respiratory Panel, DOES NOT test for the novel  Coronavirus (2019 nCoV)    Coronavirus HKU1 NOT DETECTED NOT DETECTED Final   Coronavirus NL63 NOT DETECTED NOT DETECTED Final   Coronavirus OC43 NOT DETECTED NOT DETECTED Final   Metapneumovirus NOT DETECTED NOT DETECTED Final   Rhinovirus / Enterovirus NOT DETECTED NOT DETECTED Final   Influenza A NOT DETECTED NOT DETECTED Final   Influenza B NOT DETECTED NOT DETECTED Final   Parainfluenza Virus 1 NOT DETECTED NOT DETECTED Final   Parainfluenza Virus 2 NOT DETECTED NOT DETECTED Final   Parainfluenza Virus 3 NOT DETECTED NOT DETECTED Final   Parainfluenza Virus 4 NOT DETECTED NOT DETECTED Final   Respiratory Syncytial Virus NOT DETECTED NOT DETECTED Final   Bordetella pertussis NOT DETECTED NOT DETECTED Final   Bordetella Parapertussis NOT DETECTED NOT DETECTED Final   Chlamydophila pneumoniae NOT DETECTED NOT DETECTED Final   Mycoplasma pneumoniae NOT  DETECTED NOT DETECTED Final    Comment: Performed at [facility], [place]. [address] [place], [place] [address]  Expectorated Sputum Assessment w Gram Stain, Rflx to Resp Cult     Status: None   Collection Time: 02/03/22  6:29 PM   Specimen: Sputum  Result Value Ref Range Status   Specimen Description SPUTUM  Final   Special Requests NONE  Final   Sputum evaluation   Final    Sputum specimen not acceptable for testing.  Please recollect.   CANDACE PARKER @0 :32 02/04/2022 TTG Performed at [facility], [address] [place], [place] [address]    Report Status 02/04/2022 FINAL  Final     Radiology Studies: DG Abd 1 View  Result Date: 02/06/2022 CLINICAL DATA:  Abdominal pain EXAM: ABDOMEN - 1 VIEW COMPARISON:  CT abdomen 02/03/2022 FINDINGS: The bowel gas pattern is normal. No radio-opaque calculi or other significant radiographic abnormality are seen. IMPRESSION: Negative. Electronically Signed   By: Kathreen Devoid M.D.   On: 02/06/2022 13:56    Scheduled Meds:  amLODipine  10 mg Oral Daily   amoxicillin-clavulanate  1 tablet Oral Q12H   atorvastatin  80 mg Oral q1800   Chlorhexidine Gluconate Cloth  6 each Topical Daily   cholecalciferol  2,000 Units Oral Daily   enoxaparin (LOVENOX) injection  0.5 mg/kg Subcutaneous Q24H   fenofibrate  160 mg Oral Daily   insulin aspart  0-15 Units Subcutaneous TID WC   insulin aspart  0-5 Units Subcutaneous QHS   losartan  100 mg Oral Daily   metoprolol tartrate  50 mg Oral BID   sodium chloride flush  10-40 mL Intracatheter Q12H   Continuous Infusions:  lactated ringers 75 mL/hr at 02/08/22 0240     LOS: 5 days  Shelly Coss, MD [facility] P3/01/2022, 10:28 AM

## 2022-02-09 ENCOUNTER — Encounter: Payer: Self-pay | Admitting: Internal Medicine

## 2022-02-09 ENCOUNTER — Inpatient Hospital Stay: Payer: 59

## 2022-02-09 LAB — BASIC METABOLIC PANEL
Anion gap: 7 (ref 5–15)
BUN: 12 mg/dL (ref 6–20)
CO2: 30 mmol/L (ref 22–32)
Calcium: 8.2 mg/dL — ABNORMAL LOW (ref 8.9–10.3)
Chloride: 98 mmol/L (ref 98–111)
Creatinine, Ser: 0.6 mg/dL — ABNORMAL LOW (ref 0.61–1.24)
GFR, Estimated: >60 mL/min (ref >60)
Glucose, Bld: 192 mg/dL — ABNORMAL HIGH (ref 70–99)
Potassium: 3.4 mmol/L — ABNORMAL LOW (ref 3.5–5.1)
Sodium: 135 mmol/L (ref 135–145)

## 2022-02-09 LAB — CBC WITH DIFFERENTIAL/PLATELET
Abs Immature Granulocytes: 1.69 10*3/uL — ABNORMAL HIGH (ref 0.00–0.07)
Basophils Absolute: 0 10*3/uL (ref 0.0–0.1)
Basophils Relative: 0 %
Eosinophils Absolute: 0.3 10*3/uL (ref 0.0–0.5)
Eosinophils Relative: 2 %
HCT: 33.1 % — ABNORMAL LOW (ref 39.0–52.0)
Hemoglobin: 10.5 g/dL — ABNORMAL LOW (ref 13.0–17.0)
Immature Granulocytes: 10 %
Lymphocytes Relative: 6 %
Lymphs Abs: 1 10*3/uL (ref 0.7–4.0)
MCH: 27.5 pg (ref 26.0–34.0)
MCHC: 31.7 g/dL (ref 30.0–36.0)
MCV: 86.6 fL (ref 80.0–100.0)
Monocytes Absolute: 1.5 10*3/uL — ABNORMAL HIGH (ref 0.1–1.0)
Monocytes Relative: 8 %
Neutro Abs: 13.2 10*3/uL — ABNORMAL HIGH (ref 1.7–7.7)
Neutrophils Relative %: 74 %
Platelets: 215 10*3/uL (ref 150–400)
RBC: 3.82 MIL/uL — ABNORMAL LOW (ref 4.22–5.81)
RDW: 16.2 % — ABNORMAL HIGH (ref 11.5–15.5)
Smear Review: NORMAL
WBC: 17.7 10*3/uL — ABNORMAL HIGH (ref 4.0–10.5)
nRBC: 0.2 % (ref 0.0–0.2)

## 2022-02-09 LAB — GLUCOSE, CAPILLARY
Glucose-Capillary: 183 mg/dL — ABNORMAL HIGH (ref 70–99)
Glucose-Capillary: 212 mg/dL — ABNORMAL HIGH (ref 70–99)
Glucose-Capillary: 174 mg/dL — ABNORMAL HIGH (ref 70–99)
Glucose-Capillary: 209 mg/dL — ABNORMAL HIGH (ref 70–99)

## 2022-02-09 LAB — TRIGLYCERIDES: Triglycerides: 362 mg/dL — ABNORMAL HIGH (ref <150)

## 2022-02-09 MED ORDER — PANTOPRAZOLE SODIUM 40 MG PO TBEC
40.0000 mg | DELAYED_RELEASE_TABLET | Freq: Every day | ORAL | Status: DC
  Administered 2022-02-09 – 2022-02-12 (×4): 40 mg via ORAL
  Filled 2022-02-09 (×4): qty 1

## 2022-02-09 MED ORDER — POTASSIUM CHLORIDE CRYS ER 20 MEQ PO TBCR
40.0000 meq | EXTENDED_RELEASE_TABLET | Freq: Once | ORAL | Status: AC
  Administered 2022-02-09: 40 meq via ORAL
  Filled 2022-02-09: qty 2

## 2022-02-09 MED ORDER — METOPROLOL TARTRATE 50 MG PO TABS
75.0000 mg | ORAL_TABLET | Freq: Two times a day (BID) | ORAL | Status: DC
  Administered 2022-02-09 (×2): 75 mg via ORAL
  Filled 2022-02-09 (×3): qty 1

## 2022-02-09 MED ORDER — ALUM & MAG HYDROXIDE-SIMETH 200-200-20 MG/5ML PO SUSP
30.0000 mL | Freq: Four times a day (QID) | ORAL | Status: DC | PRN
  Administered 2022-02-09: 30 mL via ORAL
  Filled 2022-02-09: qty 30

## 2022-02-09 MED ORDER — IOHEXOL 9 MG/ML PO SOLN
500.0000 mL | ORAL | Status: AC
  Administered 2022-02-09 (×2): 500 mL via ORAL

## 2022-02-09 MED ORDER — LACTATED RINGERS IV SOLN: INTRAVENOUS | Status: DC

## 2022-02-09 MED ORDER — IOHEXOL 300 MG/ML  SOLN
100.0000 mL | Freq: Once | INTRAMUSCULAR | Status: AC | PRN
  Administered 2022-02-09: 100 mL via INTRAVENOUS

## 2022-02-09 MED ORDER — SODIUM CHLORIDE 0.9 % IV SOLN: INTRAVENOUS | Status: DC

## 2022-02-09 NOTE — Consult Note (Signed)
Inpatient Consultation   Patient ID: Kevin Miller is a 61 y.o. male.  Requesting Provider: Dr. Tawanna Solo  Date of Admission: 02/03/2022  Date of Consult: 02/09/22   Reason for Consultation: pancreatitis   Patient's Chief Complaint:   Chief Complaint  Patient presents with   Abdominal Pain    61 year old Caucasian male with history of pancreatitis with pseudocyst, hypertriglyceridemia, DM 2, fatty liver, HLD, hypertension who presents to the hospital on 2/25 with abdominal pain.  Gastroenterology is consulted for pancreatitis  Patient has known history of pancreatitis secondary to hypertriglyceridemia.  His last and first episode was in 2011 which at that point he developed pseudocyst that resolved on their own.  At time of presentation the pain was diffuse, constant and nonradiating.  The pain was severe but he denies any nausea or vomiting.  Has had no further fever or chills during his hospitalization.  Denies alcohol use.  No changes in medication or dosage.   Was initially treated with abx for pna.  Initial creatinine was 1.42 with white count of 17.7 and was tachycardic.  LFTs were within normal limits.  CT was performed demonstrating acute pancreatitis with trace ascites.  Adjacent duodenitis was noted.  Patient has been improving during his hospitalization although today he felt more bloated and distended than previous.  A repeat CT was performed demonstrating peripancreatic edema.  WBC count went back up to 17 after initially improving.  Currently he is resting in bed comfortably.  Patient is bleeding well controlled.  He denies any further nausea or vomiting.  He notes that his stools are loose.  He does not take any pancreatic enzymes at home.  Denies NSAIDs, Anti-plt agents, and anticoagulants Denies family history of gastrointestinal disease and malignancy. No fhx pancreatitis Previous Endoscopies: Colon- normal- 7-10 y ago; no h/o egd   Past Medical History:   Diagnosis Date   Clostridium difficile infection 2010   Diabetes mellitus    Elevated LFTs 2011   Fatty liver    Hyperlipidemia    Hypertension    Hypogonadism male 2012   Leg fracture, left    metal rod   Neck pain    Left   Osteopenia 2011   back pain   Pancreatic pseudocyst    Resolved   Pancreatitis 2010   ? etiol   Vitamin B 12 deficiency     Past Surgical History:  Procedure Laterality Date   CHOLECYSTECTOMY     ELBOW SURGERY     right   HERNIA REPAIR     LEG SURGERY     Left fracture   TIBIA FRACTURE SURGERY     left    Allergies  Allergen Reactions   Gemfibrozil     REACTION: abn LFTs    Family History  Problem Relation Age of Onset   COPD Mother    Peripheral vascular disease Mother    Alcohol abuse Father    Colon polyps Father    Colon cancer Neg Hx     Social History   Tobacco Use   Smoking status: Never   Smokeless tobacco: Never  Vaping Use   Vaping Use: Never used  Substance Use Topics   Alcohol use: Yes    Alcohol/week: 0.0 standard drinks    Comment: extremely rare; less than 3 drinks annually   Drug use: No     Pertinent GI related history and allergies were reviewed with the patient  Review of Systems  Constitutional:  Positive for  appetite change. Negative for activity change, chills, diaphoresis, fatigue, fever and unexpected weight change.  HENT:  Negative for trouble swallowing and voice change.   Respiratory:  Negative for shortness of breath and wheezing.   Cardiovascular:  Negative for chest pain, palpitations and leg swelling.  Gastrointestinal:  Positive for abdominal distention and abdominal pain. Negative for anal bleeding, blood in stool, constipation, diarrhea, nausea and vomiting.  Musculoskeletal:  Negative for arthralgias and myalgias.  Skin:  Negative for color change and pallor.  Neurological:  Negative for dizziness, syncope and weakness.  Psychiatric/Behavioral:  Negative for confusion. The patient is  not nervous/anxious.   All other systems reviewed and are negative.   Medications Home Medications No current facility-administered medications on file prior to encounter.   Current Outpatient Medications on File Prior to Encounter  Medication Sig Dispense Refill   acetaminophen (TYLENOL) 500 MG tablet Take 1,000 mg by mouth every 6 (six) hours as needed for mild pain.     amLODipine (NORVASC) 5 MG tablet TAKE 1 TABLET BY MOUTH EVERY DAY 90 tablet 2   Cholecalciferol (THERA-D 2000) 50 MCG (2000 UT) TABS 1 tab by mouth once daily 30 tablet 99   cyanocobalamin (,VITAMIN B-12,) 1000 MCG/ML injection Inject 1 mL (1,000 mcg total) into the muscle every 30 (thirty) days. 3 mL 3   dapagliflozin propanediol (FARXIGA) 10 MG TABS tablet Take 1 tablet (10 mg total) by mouth daily. 90 tablet 3   glipiZIDE (GLUCOTROL XL) 5 MG 24 hr tablet TAKE 1 TABLET BY MOUTH EVERY DAY WITH BREAKFAST 90 tablet 2   losartan (COZAAR) 100 MG tablet Take 1 tablet (100 mg total) by mouth daily. 90 tablet 3   metoprolol tartrate (LOPRESSOR) 50 MG tablet Take 1 tablet (50 mg total) by mouth 2 (two) times daily. 180 tablet 3   naproxen (NAPROSYN) 500 MG tablet TAKE 1 TABLET BY MOUTH TWICE A DAY AS NEEDED 180 tablet 1   VITAMIN K PO Take 1 tablet by mouth daily.      atorvastatin (LIPITOR) 40 MG tablet Take 1 tablet (40 mg total) by mouth daily at 6 PM. 90 tablet 3   b complex vitamins tablet Take 1 tablet by mouth daily. (Patient not taking: Reported on 02/03/2022)     Diclofenac Sodium (PENNSAID) 2 % SOLN Place 2 g 2 (two) times daily onto the skin. (Patient not taking: Reported on 02/03/2022) 112 g 3   fenofibrate (TRICOR) 145 MG tablet Take 1 tablet (145 mg total) by mouth daily. 90 tablet 3   glucose blood (ONE TOUCH ULTRA TEST) test strip USE ONCE DAILY AS NEEDED 50 each 3   metFORMIN (GLUCOPHAGE) 500 MG tablet Take 1 tablet (500 mg total) by mouth 3 (three) times daily. (Patient not taking: Reported on 02/03/2022) 270 tablet  3   omega-3 acid ethyl esters (LOVAZA) 1 g capsule Take 3 capsules (3 g total) by mouth at bedtime. (Patient not taking: Reported on 02/03/2022) 270 capsule 3   Pertinent GI related medications were reviewed with the patient  Inpatient Medications  Current Facility-Administered Medications:    0.9 %  sodium chloride infusion, , Intravenous, Continuous, Adhikari, Amrit, MD, Last Rate: 100 mL/hr at 02/09/22 1449, New Bag at 02/09/22 1449   acetaminophen (TYLENOL) tablet 650 mg, 650 mg, Oral, Q6H PRN, Ivor Costa, MD, 650 mg at 02/06/22 0349   albuterol (PROVENTIL) (2.5 MG/3ML) 0.083% nebulizer solution 3 mL, 3 mL, Inhalation, Q4H PRN, Ivor Costa, MD, 3 mL at 02/07/22 1446  alum & mag hydroxide-simeth (MAALOX/MYLANTA) 200-200-20 MG/5ML suspension 30 mL, 30 mL, Oral, Q6H PRN, Tawanna Solo, Amrit, MD, 30 mL at 02/09/22 0829   amLODipine (NORVASC) tablet 10 mg, 10 mg, Oral, Daily, Adhikari, Amrit, MD, 10 mg at 02/09/22 0831   atorvastatin (LIPITOR) tablet 80 mg, 80 mg, Oral, q1800, Adhikari, Amrit, MD, 80 mg at 02/08/22 1737   Chlorhexidine Gluconate Cloth 2 % PADS 6 each, 6 each, Topical, Daily, Ivor Costa, MD, 6 each at 02/09/22 [phone]   cholecalciferol (VITAMIN D3) tablet 2,000 Units, 2,000 Units, Oral, Daily, Ivor Costa, MD, 2,000 Units at 02/09/22 0830   dextromethorphan-guaiFENesin ([place] DM) 30-600 MG per 12 hr tablet 1 tablet, 1 tablet, Oral, BID PRN, Ivor Costa, MD, 1 tablet at 02/08/22 2018   enoxaparin (LOVENOX) injection 57.5 mg, 0.5 mg/kg, Subcutaneous, Q24H, Rauer, Forde Dandy, RPH, 57.5 mg at 02/08/22 2233   fenofibrate tablet 160 mg, 160 mg, Oral, Daily, Ivor Costa, MD, 160 mg at 02/09/22 2671   hydrALAZINE (APRESOLINE) injection 5 mg, 5 mg, Intravenous, Q2H PRN, Ivor Costa, MD, 5 mg at 02/08/22 2017   HYDROmorphone (DILAUDID) injection 0.5 mg, 0.5 mg, Intravenous, Q4H PRN, Tawanna Solo, Amrit, MD, 0.5 mg at 02/06/22 2011   insulin aspart (novoLOG) injection 0-15 Units, 0-15 Units, Subcutaneous,  TID WC, Adhikari, Amrit, MD, 5 Units at 02/09/22 1243   insulin aspart (novoLOG) injection 0-5 Units, 0-5 Units, Subcutaneous, QHS, Adhikari, Amrit, MD, 2 Units at 02/08/22 2219   ipratropium-albuterol (DUONEB) 0.5-2.5 (3) MG/3ML nebulizer solution 3 mL, 3 mL, Nebulization, Q6H PRN, Adhikari, Amrit, MD   losartan (COZAAR) tablet 100 mg, 100 mg, Oral, Daily, Adhikari, Amrit, MD, 100 mg at 02/09/22 0831   melatonin tablet 5 mg, 5 mg, Oral, QHS PRN, Foust, Katy L, NP, 5 mg at 02/07/22 2134   metoprolol tartrate (LOPRESSOR) tablet 75 mg, 75 mg, Oral, BID, Adhikari, Amrit, MD, 75 mg at 02/09/22 0831   ondansetron (ZOFRAN) injection 4 mg, 4 mg, Intravenous, Q8H PRN, Ivor Costa, MD, 4 mg at 02/08/22 1441   oxyCODONE (Oxy IR/ROXICODONE) immediate release tablet 5 mg, 5 mg, Oral, Q6H PRN, Tawanna Solo, Amrit, MD, 5 mg at 02/08/22 2232   pantoprazole (PROTONIX) EC tablet 40 mg, 40 mg, Oral, Daily, Adhikari, Amrit, MD, 40 mg at 02/09/22 0829   sodium chloride flush (NS) 0.9 % injection 10-40 mL, 10-40 mL, Intracatheter, Q12H, Adhikari, Amrit, MD, 10 mL at 02/09/22 0833   sodium chloride flush (NS) 0.9 % injection 10-40 mL, 10-40 mL, Intracatheter, PRN, Tawanna Solo, Amrit, MD  sodium chloride 100 mL/hr at 02/09/22 1449    acetaminophen, albuterol, alum & mag hydroxide-simeth, dextromethorphan-guaiFENesin, hydrALAZINE, HYDROmorphone (DILAUDID) injection, ipratropium-albuterol, melatonin, ondansetron (ZOFRAN) IV, oxyCODONE, sodium chloride flush   Objective   Vitals:   02/09/22 0059 02/09/22 0433 02/09/22 0812 02/09/22 1230  BP: (!) 161/86 (!) 159/92 (!) 157/88 (!) 153/83  Pulse: (!) 112 (!) 107 (!) 106 (!) 108  Resp: 18 18 20 20   Temp: 98.4 F (36.9 C) 98.6 F (37 C) 98.1 F (36.7 C) 98.4 F (36.9 C)  TempSrc: Oral Oral Oral   SpO2: (!) 89% (!) 88% 90% 93%  Weight:      Height:        Physical Exam Vitals and nursing note reviewed.  Constitutional:      Appearance: He is ill-appearing. He is not  toxic-appearing or diaphoretic.  HENT:     Head: Normocephalic and atraumatic.     Nose: Nose normal.     Mouth/Throat:  Mouth: Mucous membranes are moist.     Pharynx: Oropharynx is clear.  Eyes:     General: No scleral icterus.    Extraocular Movements: Extraocular movements intact.  Cardiovascular:     Rate and Rhythm: Regular rhythm. Tachycardia present.     Heart sounds: Normal heart sounds. No murmur heard.   No friction rub. No gallop.  Pulmonary:     Effort: Pulmonary effort is normal. No respiratory distress.     Breath sounds: Normal breath sounds. No wheezing, rhonchi or rales.  Abdominal:     General: Bowel sounds are normal. There is distension.     Palpations: Abdomen is soft.     Tenderness: There is abdominal tenderness (mild- no rigidity, non peritoneal). There is no guarding or rebound.     Comments: Hypoactive bowel sounds  Musculoskeletal:     Cervical back: Neck supple.     Right lower leg: Edema present.     Left lower leg: Edema present.  Skin:    General: Skin is warm and dry.     Coloration: Skin is not jaundiced or pale.  Neurological:     General: No focal deficit present.     Mental Status: He is alert and oriented to person, place, and time. Mental status is at baseline.  Psychiatric:        Mood and Affect: Mood normal.        Behavior: Behavior normal.        Thought Content: Thought content normal.        Judgment: Judgment normal.    Laboratory Data Recent Labs  Lab 02/06/22 0435 02/07/22 0627 02/09/22 0605  WBC 10.5 11.1* 17.7*  HGB 10.8* 10.8* 10.5*  HCT 33.5* 33.2* 33.1*  PLT 195 271 215   Recent Labs  Lab 02/03/22 1109 02/03/22 1911 02/07/22 0627 02/08/22 0631 02/09/22 0605  NA 132*   < > 137 136 135  K 5.0   < > 3.9 3.5 3.4*  CL 100   < > 102 101 98  CO2 12*   < > 19* 24 30  BUN 19   < > 16 15 12   CALCIUM 7.2*   < > 8.3* 8.6* 8.2*  PROT 6.3*  --   --   --   --   BILITOT 1.5*  --   --   --   --   ALKPHOS 67  --    --   --   --   ALT 25  --   --   --   --   AST 39  --   --   --   --   GLUCOSE 348*   < > 150* 161* 192*   < > = values in this interval not displayed.   Recent Labs  Lab 02/03/22 1216  INR 1.1    No results for input(s): LIPASE in the last 72 hours.      Imaging Studies: CT ABDOMEN PELVIS W CONTRAST  Result Date: 02/09/2022 CLINICAL DATA:  Abdominal pain EXAM: CT ABDOMEN AND PELVIS WITH CONTRAST TECHNIQUE: Multidetector CT imaging of the abdomen and pelvis was performed using the standard protocol following bolus administration of intravenous contrast. RADIATION DOSE REDUCTION: This exam was performed according to the departmental dose-optimization program which includes automated exposure control, adjustment of the mA and/or kV according to patient size and/or use of iterative reconstruction technique. CONTRAST:  11mL OMNIPAQUE IOHEXOL 300 MG/ML  SOLN COMPARISON:  02/03/2022 FINDINGS: Lower chest: There  is small to moderate left pleural effusion. There is minimal right pleural effusion. Compression atelectasis is seen in the lower lung fields, more so on the left side. Hepatobiliary: Liver measures 25.4 cm. There is fatty infiltration. There is no dilation of bile ducts. Surgical clips are seen in gallbladder fossa. Pancreas: There is interval worsening of peripancreatic edema. There is no definite focal necrosis. There are no loculated fluid collections in the or around the pancreas. Spleen: Spleen measures 14 cm in maximum diameter. Adrenals/Urinary Tract: Adrenals are unremarkable. There is no hydronephrosis. There are no renal or ureteral stones. There are small parapelvic cysts in the kidneys, more so on the left side. Stomach/Bowel: Stomach is unremarkable. Small bowel loops are not dilated. Appendix is not dilated. There is no significant focal wall thickening in colon. Vascular/Lymphatic: Unremarkable. Reproductive: Unremarkable. Other: Small ascites is present. There is no  pneumoperitoneum. Left inguinal hernia containing fat is seen. Small umbilical hernia containing fat is seen. There is subcutaneous edema suggesting anasarca. Musculoskeletal: There is encroachment of neural foramina from L3-S1 levels. IMPRESSION: There is interval worsening of peripancreatic edema extending into the mesentery suggesting worsening of acute pancreatitis. There is no abnormal loculated fluid collection in or around the pancreas. There is no evidence of focal pancreatic necrosis. There is no evidence of intestinal obstruction or pneumoperitoneum. There is no hydronephrosis. Appendix is unremarkable. Enlarged fatty liver. Enlarged spleen. Parapelvic cysts seen in the kidneys. Small ascites. Other findings as described in the body of the report. Electronically Signed   By: Elmer Picker M.D.   On: 02/09/2022 13:57   DG Chest Port 1 View  Result Date: 02/08/2022 CLINICAL DATA:  Shortness of breath EXAM: PORTABLE CHEST 1 VIEW COMPARISON:  02/03/2022 FINDINGS: Single frontal view of the chest demonstrates interval placement of a right-sided PICC tip overlying superior vena cava. Cardiac silhouette is unremarkable. Lung volumes are diminished, with crowding of the central vasculature. No airspace disease, effusion, or pneumothorax. No acute bony abnormality. IMPRESSION: 1. Low lung volumes.  No acute airspace disease. Electronically Signed   By: Randa Ngo M.D.   On: 02/08/2022 17:56    Assessment:   # Acute pancreatitis - 2/2 hypertriglyceridemia - s/p cholecystectomy (2011)- first and only other attack -  no fhx pancreatitis - has h/o pseudocyst- non currently on imaging - on statin and fenofibrate - repeat CT scan performed with worsening edema- suspect this is evolving imaging over worsening pancreatitis since pt is doing clinically better - no steroid or alcohol use - losartan and atorvastatin have been associated with drug induced pancreatitis, but likelihood is low - no  evidence of ductal leak at this time  # Leukocytosis - suspect reactive/inflammation from pancreatitis - was on abx from recent pna- course completed  # AKI- likely 2/2 pancreatitis; resolved  # Hypertriglyceridemia -  improving  #DM2  Plan:  Continue PO- low fat, gi soft diet. Avoid dairy Recheck triglycerides- if elevated, consider reinitiating of insulin gtt. Can consider plasmapheresis at that point, however, pt clinically improving so not indicated currently Recheck Lfts in am Continue lactated ringer ivf No indication for abx at this time Symptomatic control- anti emetics, pain medicine as per primary team Bowel regimen recommended- miralax daily given requirement for opioids Outpatient GI follow up. May consider repeat scan in several weeks  I personally performed the service.  Management of other medical comorbidities as per primary team  Thank you for allowing [place] to participate in this patient's care. Please don't hesitate to  call if any questions or concerns arise.   Annamaria Helling, DO [facility]  Portions of the record may have been created with voice recognition software. Occasional wrong-word or 'sound-a-like' substitutions may have occurred due to the inherent limitations of voice recognition software.  Read the chart carefully and recognize, using context, where substitutions may have occurred.

## 2022-02-09 NOTE — Progress Notes (Addendum)
PROGRESS NOTE  Kevin Miller  ZYS:[id number] DOB: [birth year] DOA: 02/03/2022 PCP: Biagio Borg, MD   Brief Narrative: Patient is a 61 year old male with history of pancreatitis, pancreatic pseudocyst, hyperlipidemia, hypertriglyceridemia, hypertension, diabetes type 2, hypogonadism who presented with abdominal pain.  No history of alcohol use.  On presentation he was hypotensive so was started on IV fluids.  Lab work showed lipase level 637, creatinine of 1.4, leukocytosis.  Triglyceride level found in the range of 4000.  Patient was admitted for the management of pancreatitis secondary to hypertriglyceridemia.  Started on insulin drip.  Triglyceride level has significantly improved with insulin drip, drip discontinued.  Hospital course remarkable for persistent abdominal discomfort,now improving.  Diet advanced to full liquid.  Undergoing CT abdomen/pelvis for follow-up examination due to leukocytosis.   Assessment & Plan:  Principal Problem:   Acute recurrent pancreatitis Active Problems:   Hyperlipidemia   Hypertriglyceridemia   Severe sepsis (HCC)   Diabetes mellitus without complication (HCC)   HTN (hypertension)   AKI (acute kidney injury) ([place])   Lactic acidosis   Acute bronchiolitis   Assessment and Plan: * Acute recurrent pancreatitis Has history of pancreatitis, pancreatic pseudocyst.  CT abdomen/pelvis showed acute pancreatitis with significant peripancreatic inflammatory change, trace amount of ascites.  No discrete acute peripancreatic fluid collection.  Also showed thickening and inflammation of duodenum most likely reactive.  Pancreatitis secondary to hypertriglyceridemia. Abd xray does not show ileus.  Abdominal distention has improved.  Improved abdominal pain.  He had bowel movements.  Diet advanced to full, does not want to advance today. Developed unusual leukocytosis today.  On clinical examination, he does not have any alarming signs.  Ordered follow-up CT  abdomen/pelvis with contrast   Acute bronchiolitis Now on room air  Chest x-ray showed features of bronchitis/developing multilobular bilateral pneumonia.  Antibiotics changed to oral, completed course.  Follow-up chest x-ray yesterday did not show any acute findings.  Lactic acidosis Resolved with IV fluids  AKI (acute kidney injury) ([place]) Resolved.   HTN (hypertension)  Takes Cozaar, amlodipine, metoprolol at home.  Restarted, remains mildly hypertensive.  Increased the dose of amlodipine to 10 mg daily, metoprolol increased to 75 mg twice daily  Diabetes mellitus without complication (HCC) Takes glipizide, Farxiga at home.  No evidence of DKA.    Severe sepsis ([place]) Presented with hypotension, leukocytosis, AKI.  Likely from pneumonia.  Cultures: NGTD. Elevated procalcitonin.  Currently hemodynamically stable.  Finished antibiotics course  Hypertriglyceridemia Root problem for pancreatitis.  Triglyceride level was in the range of 4000 on admission.  Started on insulin drip with significant improvement.  Insulin drip was stopped.  Continue high-dose Lipitor, fenofibrate.  He needs to follow-up with lipid clinic as an outpatient  Hyperlipidemia On Lipitor and fenofibrate   Obesity: BMI of 33.9  Addendum:  Follow-up CT scan showed worsening of pancreatitis but no fluid collection or any other signs suggesting necrotic  pancreatitis.  No indication for starting antibiotics.  I have consulted GI, sent secure chat message to Dr. Virgina Jock.  Will change his diet back to clears . I will also recheck triglycerides.         DVT prophylaxis:     Code Status: Full Code  Family Communication: daughter at bedside on 2/28  Patient status:Inpatient  Patient is from :Home  Anticipated discharge XN:ATFT  Estimated DC date:Likely in next 1 to 2 days.  Diet needs to be advanced.   Consultants: None  Procedures:None  Antimicrobials:  Anti-infectives (From admission, onward)  Start     Dose/Rate Route Frequency Ordered Stop   02/07/22 1800  azithromycin (ZITHROMAX) tablet 500 mg  Status:  Discontinued        500 mg Oral  Once 02/07/22 0736 02/07/22 0852   02/07/22 1000  amoxicillin-clavulanate (AUGMENTIN) 875-125 MG per tablet 1 tablet        1 tablet Oral Every 12 hours 02/07/22 0852 02/08/22 2232   02/03/22 2100  piperacillin-tazobactam (ZOSYN) IVPB 3.375 g  Status:  Discontinued        3.375 g 12.5 mL/hr over 240 Minutes Intravenous Every 8 hours 02/03/22 1754 02/07/22 0852   02/03/22 1800  azithromycin (ZITHROMAX) 500 mg in sodium chloride 0.9 % 250 mL IVPB  Status:  Discontinued        500 mg 250 mL/hr over 60 Minutes Intravenous Every 24 hours 02/03/22 1738 02/07/22 0736   02/03/22 1200  metroNIDAZOLE (FLAGYL) IVPB 500 mg        500 mg 100 mL/hr over 60 Minutes Intravenous  Once 02/03/22 1146 02/03/22 1336   02/03/22 1200  ceFEPIme (MAXIPIME) 2 g in sodium chloride 0.9 % 100 mL IVPB        2 g 200 mL/hr over 30 Minutes Intravenous  Once 02/03/22 1149 02/03/22 1256       Subjective:  Patient seen and examined at the bedside this morning.  Hemodynamically stable.  Blood pressure little high but not terribly bad.  He complains of indigestion, heartburn today.  Ordered Mag-Al, Protonix.  Last bowel movement was yesterday.  Denies any worsening abdominal pain.  Afebrile.  I encouraged him to ambulate. Objective: Vitals:   02/08/22 2221 02/09/22 0059 02/09/22 0433 02/09/22 0812  BP: (!) 145/91 (!) 161/86 (!) 159/92 (!) 157/88  Pulse: (!) 117 (!) 112 (!) 107 (!) 106  Resp: 18 18 18 20   Temp: 98.1 F (36.7 C) 98.4 F (36.9 C) 98.6 F (37 C) 98.1 F (36.7 C)  TempSrc: Oral Oral Oral Oral  SpO2: 97% (!) 89% (!) 88% 90%  Weight:      Height:        Intake/Output Summary (Last 24 hours) at 02/09/2022 1125 Last data filed at 02/09/2022 1049 Gross per 24 hour  Intake 370 ml  Output 800 ml  Net -430 ml   Filed Weights   02/03/22 1107  Weight: 113.4 kg     Examination:   General exam: Overall comfortable, not in distress,obese HEENT: PERRL Respiratory system:  no wheezes or crackles  Cardiovascular system: S1 & S2 heard, RRR.  Gastrointestinal system: Abdomen is distended, soft and mostly nontender.  Good bowel sounds Central nervous system: Alert and oriented Extremities: No edema, no clubbing ,no cyanosis Skin: No rashes, no ulcers,no icterus    Data Reviewed: I have personally reviewed following labs and imaging studies  CBC: Recent Labs  Lab 02/04/22 0301 02/05/22 0607 02/06/22 0435 02/07/22 0627 02/09/22 0605  WBC 12.5* 9.7 10.5 11.1* 17.7*  NEUTROABS  --  8.2* 8.3* 7.9* 13.2*  HGB 12.1* 11.2* 10.8* 10.8* 10.5*  HCT 35.5* 34.2* 33.5* 33.2* 33.1*  MCV 85.7 86.6 87.0 85.8 86.6  PLT 228 186 195 271 378   Basic Metabolic Panel: Recent Labs  Lab 02/03/22 1911 02/03/22 2223 02/04/22 0301 02/04/22 0639 02/05/22 0607 02/06/22 0435 02/07/22 0627 02/08/22 0631 02/09/22 0605  NA 132*   < > 134*   < > 130* 135 137 136 135  K 5.2*   < > 3.8   < > 3.8  3.7 3.9 3.5 3.4*  CL 104   < > 101   < > 101 102 102 101 98  CO2 16*   < > 21*   < > 22 26 19* 24 30  GLUCOSE 203*   < > 56*   < > 96 100* 150* 161* 192*  BUN 22*   < > 23*   < > 13 11 16 15 12   CREATININE 1.31*   < > 1.39*   < > 0.66 0.69 0.83 0.77 0.60*  CALCIUM 6.5*   < > 6.6*   < > 7.2* 8.1* 8.3* 8.6* 8.2*  MG 1.8  --  2.3  --   --   --   --   --   --   PHOS 2.7  --  2.8  --   --   --   --   --   --    < > = values in this interval not displayed.     Recent Results (from the past 240 hour(s))  Blood Culture (routine x 2)     Status: None   Collection Time: 02/03/22 12:16 PM   Specimen: BLOOD  Result Value Ref Range Status   Specimen Description BLOOD BLOOD RIGHT WRIST  Final   Special Requests   Final    BOTTLES DRAWN AEROBIC AND ANAEROBIC Blood Culture adequate volume   Culture   Final    NO GROWTH 5 DAYS Performed at [facility], [address], [place], [place] [address]    Report Status 02/08/2022 FINAL  Final  Blood Culture (routine x 2)     Status: None   Collection Time: 02/03/22 12:16 PM   Specimen: BLOOD  Result Value Ref Range Status   Specimen Description BLOOD RIGHT ANTECUBITAL  Final   Special Requests   Final    BOTTLES DRAWN AEROBIC AND ANAEROBIC Blood Culture adequate volume   Culture   Final    NO GROWTH 5 DAYS Performed at [facility], [address] [place], [place] [address]    Report Status 02/08/2022 FINAL  Final  Resp Panel by RT-PCR (Flu A&B, Covid) Nasopharyngeal Swab     Status: None   Collection Time: 02/03/22 12:16 PM   Specimen: Nasopharyngeal Swab; Nasopharyngeal(NP) swabs in vial transport medium  Result Value Ref Range Status   SARS Coronavirus 2 by RT PCR NEGATIVE NEGATIVE Final    Comment: (NOTE) SARS-CoV-2 target nucleic acids are NOT DETECTED.  The SARS-CoV-2 RNA is generally detectable in upper respiratory specimens during the acute phase of infection. The lowest concentration of SARS-CoV-2 viral copies this assay can detect is 138 copies/mL. A negative result does not preclude SARS-Cov-2 infection and should not be used as the sole basis for treatment or other patient management decisions. A negative result may occur with  improper specimen collection/handling, submission of specimen other than nasopharyngeal swab, presence of viral mutation(s) within the areas targeted by this assay, and inadequate number of viral copies(<138 copies/mL). A negative result must be combined with clinical observations, patient history, and epidemiological information. The expected result is Negative.  Fact Sheet for Patients:  EntrepreneurPulse.com.au  Fact Sheet for Healthcare Providers:  IncredibleEmployment.be  This test is no t yet approved or cleared by the [place] FDA and  has been authorized for detection and/or diagnosis of SARS-CoV-2  by FDA under an Emergency Use Authorization (EUA). This EUA will remain  in effect (meaning this test can be used) for the duration  of the COVID-19 declaration under Section 564(b)(1) of the Act, 21 U.S.C.section 360bbb-3(b)(1), unless the authorization is terminated  or revoked sooner.       Influenza A by PCR NEGATIVE NEGATIVE Final   Influenza B by PCR NEGATIVE NEGATIVE Final    Comment: (NOTE) The Xpert Xpress SARS-CoV-2/FLU/RSV plus assay is intended as an aid in the diagnosis of influenza from Nasopharyngeal swab specimens and should not be used as a sole basis for treatment. Nasal washings and aspirates are unacceptable for Xpert Xpress SARS-CoV-2/FLU/RSV testing.  Fact Sheet for Patients: EntrepreneurPulse.com.au  Fact Sheet for Healthcare Providers: IncredibleEmployment.be  This test is not yet approved or cleared by the [place] FDA and has been authorized for detection and/or diagnosis of SARS-CoV-2 by FDA under an Emergency Use Authorization (EUA). This EUA will remain in effect (meaning this test can be used) for the duration of the COVID-19 declaration under Section 564(b)(1) of the Act, 21 U.S.C. section 360bbb-3(b)(1), unless the authorization is terminated or revoked.  Performed at [facility], [place], [place] [address]   Respiratory (~20 pathogens) panel by PCR     Status: None   Collection Time: 02/03/22 12:20 PM   Specimen: Nasopharyngeal Swab; Respiratory  Result Value Ref Range Status   Adenovirus NOT DETECTED NOT DETECTED Final   Coronavirus 229E NOT DETECTED NOT DETECTED Final    Comment: (NOTE) The Coronavirus on the Respiratory Panel, DOES NOT test for the novel  Coronavirus (2019 nCoV)    Coronavirus HKU1 NOT DETECTED NOT DETECTED Final   Coronavirus NL63 NOT DETECTED NOT DETECTED Final   Coronavirus OC43 NOT DETECTED NOT DETECTED Final   Metapneumovirus NOT DETECTED NOT  DETECTED Final   Rhinovirus / Enterovirus NOT DETECTED NOT DETECTED Final   Influenza A NOT DETECTED NOT DETECTED Final   Influenza B NOT DETECTED NOT DETECTED Final   Parainfluenza Virus 1 NOT DETECTED NOT DETECTED Final   Parainfluenza Virus 2 NOT DETECTED NOT DETECTED Final   Parainfluenza Virus 3 NOT DETECTED NOT DETECTED Final   Parainfluenza Virus 4 NOT DETECTED NOT DETECTED Final   Respiratory Syncytial Virus NOT DETECTED NOT DETECTED Final   Bordetella pertussis NOT DETECTED NOT DETECTED Final   Bordetella Parapertussis NOT DETECTED NOT DETECTED Final   Chlamydophila pneumoniae NOT DETECTED NOT DETECTED Final   Mycoplasma pneumoniae NOT DETECTED NOT DETECTED Final    Comment: Performed at [facility], [place]. [address] [place], [place] [address]  Expectorated Sputum Assessment w Gram Stain, Rflx to Resp Cult     Status: None   Collection Time: 02/03/22  6:29 PM   Specimen: Sputum  Result Value Ref Range Status   Specimen Description SPUTUM  Final   Special Requests NONE  Final   Sputum evaluation   Final    Sputum specimen not acceptable for testing.  Please recollect.   CANDACE PARKER @0 :32 02/04/2022 TTG Performed at [facility], [address] [place], [place] [address]    Report Status 02/04/2022 FINAL  Final     Radiology Studies: [facility] Chest Port 1 View  Result Date: 02/08/2022 CLINICAL DATA:  Shortness of breath EXAM: PORTABLE CHEST 1 VIEW COMPARISON:  02/03/2022 FINDINGS: Single frontal view of the chest demonstrates interval placement of a right-sided PICC tip overlying superior vena cava. Cardiac silhouette is unremarkable. Lung volumes are diminished, with crowding of the central vasculature. No airspace disease, effusion, or pneumothorax. No acute bony abnormality. IMPRESSION: 1. Low lung volumes.  No acute  airspace disease. Electronically Signed   By: Randa Ngo M.D.   On: 02/08/2022 17:56    Scheduled Meds:  amLODipine  10 mg Oral Daily    atorvastatin  80 mg Oral q1800   Chlorhexidine Gluconate Cloth  6 each Topical Daily   cholecalciferol  2,000 Units Oral Daily   enoxaparin (LOVENOX) injection  0.5 mg/kg Subcutaneous Q24H   fenofibrate  160 mg Oral Daily   insulin aspart  0-15 Units Subcutaneous TID WC   insulin aspart  0-5 Units Subcutaneous QHS   losartan  100 mg Oral Daily   metoprolol tartrate  75 mg Oral BID   pantoprazole  40 mg Oral Daily   sodium chloride flush  10-40 mL Intracatheter Q12H   Continuous Infusions:     LOS: 6 days   Shelly Coss, MD [facility] P3/02/2022, 11:25 AM

## 2022-02-10 DIAGNOSIS — R6520 Severe sepsis without septic shock: Secondary | ICD-10-CM

## 2022-02-10 DIAGNOSIS — J218 Acute bronchiolitis due to other specified organisms: Secondary | ICD-10-CM

## 2022-02-10 DIAGNOSIS — E782 Mixed hyperlipidemia: Secondary | ICD-10-CM

## 2022-02-10 DIAGNOSIS — E781 Pure hyperglyceridemia: Secondary | ICD-10-CM

## 2022-02-10 DIAGNOSIS — R0602 Shortness of breath: Secondary | ICD-10-CM

## 2022-02-10 DIAGNOSIS — A419 Sepsis, unspecified organism: Principal | ICD-10-CM

## 2022-02-10 LAB — CBC WITH DIFFERENTIAL/PLATELET
Abs Immature Granulocytes: 1.26 10*3/uL — ABNORMAL HIGH (ref 0.00–0.07)
Basophils Absolute: 0.1 10*3/uL (ref 0.0–0.1)
Basophils Relative: 1 %
Eosinophils Absolute: 0.2 10*3/uL (ref 0.0–0.5)
Eosinophils Relative: 1 %
HCT: 32.5 % — ABNORMAL LOW (ref 39.0–52.0)
Hemoglobin: 10.2 g/dL — ABNORMAL LOW (ref 13.0–17.0)
Immature Granulocytes: 7 %
Lymphocytes Relative: 5 %
Lymphs Abs: 0.9 10*3/uL (ref 0.7–4.0)
MCH: 27.4 pg (ref 26.0–34.0)
MCHC: 31.4 g/dL (ref 30.0–36.0)
MCV: 87.4 fL (ref 80.0–100.0)
Monocytes Absolute: 1.3 10*3/uL — ABNORMAL HIGH (ref 0.1–1.0)
Monocytes Relative: 7 %
Neutro Abs: 15 10*3/uL — ABNORMAL HIGH (ref 1.7–7.7)
Neutrophils Relative %: 79 %
Platelets: 180 10*3/uL (ref 150–400)
RBC: 3.72 MIL/uL — ABNORMAL LOW (ref 4.22–5.81)
RDW: 15.9 % — ABNORMAL HIGH (ref 11.5–15.5)
Smear Review: NORMAL
WBC: 18.8 10*3/uL — ABNORMAL HIGH (ref 4.0–10.5)
nRBC: 0 % (ref 0.0–0.2)

## 2022-02-10 LAB — GLUCOSE, CAPILLARY
Glucose-Capillary: 275 mg/dL — ABNORMAL HIGH (ref 70–99)
Glucose-Capillary: 197 mg/dL — ABNORMAL HIGH (ref 70–99)
Glucose-Capillary: 207 mg/dL — ABNORMAL HIGH (ref 70–99)
Glucose-Capillary: 248 mg/dL — ABNORMAL HIGH (ref 70–99)

## 2022-02-10 LAB — BASIC METABOLIC PANEL
Anion gap: 7 (ref 5–15)
BUN: 12 mg/dL (ref 6–20)
CO2: 29 mmol/L (ref 22–32)
Calcium: 8.2 mg/dL — ABNORMAL LOW (ref 8.9–10.3)
Chloride: 98 mmol/L (ref 98–111)
Creatinine, Ser: 0.67 mg/dL (ref 0.61–1.24)
GFR, Estimated: >60 mL/min (ref >60)
Glucose, Bld: 214 mg/dL — ABNORMAL HIGH (ref 70–99)
Potassium: 3.7 mmol/L (ref 3.5–5.1)
Sodium: 134 mmol/L — ABNORMAL LOW (ref 135–145)

## 2022-02-10 LAB — HEPATIC FUNCTION PANEL
ALT: 19 U/L (ref 0–44)
AST: 28 U/L (ref 15–41)
Albumin: 1.8 g/dL — ABNORMAL LOW (ref 3.5–5.0)
Alkaline Phosphatase: 79 U/L (ref 38–126)
Bilirubin, Direct: 0.1 mg/dL (ref 0.0–0.2)
Indirect Bilirubin: 0.5 mg/dL (ref 0.3–0.9)
Total Bilirubin: 0.6 mg/dL (ref 0.3–1.2)
Total Protein: 5.3 g/dL — ABNORMAL LOW (ref 6.5–8.1)

## 2022-02-10 MED ORDER — METOPROLOL TARTRATE 50 MG PO TABS
100.0000 mg | ORAL_TABLET | Freq: Two times a day (BID) | ORAL | Status: DC
  Administered 2022-02-10 – 2022-02-12 (×5): 100 mg via ORAL
  Filled 2022-02-10 (×5): qty 2

## 2022-02-10 MED ORDER — SIMETHICONE 80 MG PO CHEW
80.0000 mg | CHEWABLE_TABLET | Freq: Four times a day (QID) | ORAL | Status: DC | PRN
  Filled 2022-02-10: qty 1

## 2022-02-10 NOTE — Progress Notes (Signed)
Noted a 20oz bottle of diet [employer] and a 20oz bottle of Pepsi One on the patient's bedside table. Educated the patient  that  caffeine elevates BP and HR and that since his BP and HR have been trending high he should stop drinking both beverages. The patient verbalized that he understood the provided education and stated that he would "cut down" on the amount that he drinks.  ?

## 2022-02-10 NOTE — Progress Notes (Signed)
GI Inpatient Follow-up Note ? ?Subjective: ? ?Patient seen in follow-up for acute pancreatitis 2/2 hypertriglyceridemia. No acute events overnight. He has girlfriend with him at bedside. He reports that his abdominal distention is improved compared to yesterday. He has been on liquid diet this morning with no postprandial abdominal pain, nausea, or vomiting. He was given incentive spirometer today. He has had a few bowel movements this morning after taking Miralax. No rectal bleeding. He denies any fevers or altered mental status.  ? ?Scheduled Inpatient Medications:  ? amLODipine  10 mg Oral Daily  ? atorvastatin  80 mg Oral q1800  ? Chlorhexidine Gluconate Cloth  6 each Topical Daily  ? cholecalciferol  2,000 Units Oral Daily  ? enoxaparin (LOVENOX) injection  0.5 mg/kg Subcutaneous Q24H  ? fenofibrate  160 mg Oral Daily  ? insulin aspart  0-15 Units Subcutaneous TID WC  ? insulin aspart  0-5 Units Subcutaneous QHS  ? losartan  100 mg Oral Daily  ? metoprolol tartrate  100 mg Oral BID  ? pantoprazole  40 mg Oral Daily  ? sodium chloride flush  10-40 mL Intracatheter Q12H  ? ? ?PRN Inpatient Medications:  ?acetaminophen, albuterol, alum & mag hydroxide-simeth, dextromethorphan-guaiFENesin, ipratropium-albuterol, melatonin, ondansetron (ZOFRAN) IV, oxyCODONE, sodium chloride flush ? ?Review of Systems: ?Constitutional: Weight is stable.  ?Eyes: No changes in vision. ?ENT: No oral lesions, sore throat.  ?GI: see HPI.  ?Heme/Lymph: No easy bruising.  ?CV: No chest pain.  ?GU: No hematuria.  ?Integumentary: No rashes.  ?Neuro: No headaches.  ?Psych: No depression/anxiety.  ?Endocrine: No heat/cold intolerance.  ?Allergic/Immunologic: No urticaria.  ?Resp: No cough, SOB.  ?Musculoskeletal: No joint swelling.  ?  ?Physical Examination: ?BP (!) 168/88 (BP Location: Left Arm)   Pulse (!) 108   Temp 98.7 ?F (37.1 ?C) (Oral)   Resp 18   Ht 6' (1.829 m)   Wt 113.4 kg   SpO2 96%   BMI 33.91 kg/m?  ?Gen: NAD, alert and  oriented x 4 ?HEENT: PEERLA, EOMI, ?Neck: supple, no JVD or thyromegaly ?Chest: CTA bilaterally, no wheezes, crackles, or other adventitious sounds ?CV: RRR, no m/g/c/r ?Abd: soft, mild distention, tender to deep palpation in LUQ and epigastrium, no RUQ TTP, no HSM, guarding, ridigity, or rebound tenderness ?Ext: no edema, well perfused with 2+ pulses, ?Skin: no rash or lesions noted ?Lymph: no LAD ? ?Data: ?Lab Results  ?Component Value Date  ? WBC 18.8 (H) 02/10/2022  ? HGB 10.2 (L) 02/10/2022  ? HCT 32.5 (L) 02/10/2022  ? MCV 87.4 02/10/2022  ? PLT 180 02/10/2022  ? ?Recent Labs  ?Lab 02/07/22 ?7416 02/09/22 ?3845 02/10/22 ?0650  ?HGB 10.8* 10.5* 10.2*  ? ?Lab Results  ?Component Value Date  ? NA 134 (L) 02/10/2022  ? K 3.7 02/10/2022  ? CL 98 02/10/2022  ? CO2 29 02/10/2022  ? BUN 12 02/10/2022  ? CREATININE 0.67 02/10/2022  ? ?Lab Results  ?Component Value Date  ? ALT 19 02/10/2022  ? AST 28 02/10/2022  ? ALKPHOS 79 02/10/2022  ? BILITOT 0.6 02/10/2022  ? ?Recent Labs  ?Lab 02/03/22 ?1216  ?INR 1.1  ? ?Assessment/Plan: ? ?61 y/o Caucasian male with a PMH of Hx of pancreatitis with pseudocyst in 2011, hypertriglyceridemia, T2DM, fatty liver disease, HLD, HTN, and obesity admitted for acute pancreatitis 2/2 hypertriglyceridemia 2/25. GI consulted for further evaluation and management yesterday due to concerns of abdominal distention and worsening pain. Repeat CT scan yesterday showed interval worsening of peripancreatic edema extending  into the mesentery suggesting worsening acute pancreatitis. No evidence of pseudocyst or pancreatic necrosis.  ? ?Acute pancreatitis 2/2 hypertriglyceridemia - suspect 2/2 evolving imaging over worsening pancreatitis since patient is clinically better ? ?Leukocytosis - 2/2 reactive/inflammation from pancreatitis ? ?AKI - resolved ? ?Hypertriglyceridemia - improving  ? ?T2DM   ? ?Recommendations: ? ?- Clinically, patient is improving with less pain and less abdominal distention ?-  Leukocytosis little worse to 18.8K which suspect is 2/2 resolving pancreatitis ?- Continue to trend triglycerides  ?- Continue supportive care with pain control, IV fluids, and anti-emetics ?- Continue bowel regimen ?- No further GI interventions planned at this time. Continue current treatment.  ?- Outpatient GI follow-up with [facility] ?- GI will sign off at this time.  ? ?Please call with questions or concerns. ? ? ? ?Octavia Bruckner, PA-C ?[facility] ?[phone] ?[phone] (Cell) ? ? ? ?

## 2022-02-10 NOTE — Progress Notes (Signed)
Provided incentive spirometer education to the patient. ?

## 2022-02-10 NOTE — Progress Notes (Signed)
PROGRESS NOTE  Kevin Miller    DOB: 1961/08/03, 61 y.o.  PZW:[id number]    Code Status: Full Code   DOA: 02/03/2022   LOS: 7   Brief hospital course  Kevin Miller is a 61 y.o. male with a PMH significant for pancreatitis, pancreatic pseudocyst, HLD, hypertriglyceridemia, HTN, Type II DM, C difficile, hypogonadism. They presented from home to the ED on 02/03/2022 with abdominal pain x 2 days. He did not have N/V/D.  In the ED, it was found that they had hypotension abdominal CT showed acute pancreatitis. He also had hypertriglyceridemia with 4252. Additionally, he was found to have bronchitis on chest xray.  They were treated with insulin drip, supplemental oxygen, and analgesia. GI was consulted.  Patient was admitted to medicine service for further workup and management of pancreatitis, bronchitis as outlined in detail below.  02/10/22 -stable  Assessment & Plan  Principal Problem:   Acute recurrent pancreatitis Active Problems:   Hyperlipidemia   Hypertriglyceridemia   Severe sepsis (HCC)   Diabetes mellitus without complication (HCC)   HTN (hypertension)   AKI (acute kidney injury) ([place])   Lactic acidosis   Acute bronchiolitis  Acute recurrent pancreatitis- CT abdomen/pelvis showed acute pancreatitis with significant peripancreatic inflammatory change, trace amount of ascites.  No discrete acute peripancreatic fluid collection.  Also showed thickening and inflammation of duodenum most likely reactive.  Pancreatitis secondary to hypertriglyceridemia. Abd xray does not show ileus.  Abdominal distention has improved.  Improved abdominal pain.  He had bowel movements.  Diet advanced to softs. Repeat CT without significant change. - GI following, appreciate recs - advance diet as tolerated   Acute bronchiolitis Now on room air  Chest x-ray showed features of bronchitis/developing multilobular bilateral pneumonia.  Antibiotics changed to oral, completed course.  Follow-up  chest x-ray yesterday did not show any acute findings.   AKI - Resolved.    HTN - mildly elevated - continue amlodipine, losartan, metoprolol   Diabetes mellitus without complication ([place]) Takes glipizide, Farxiga at home.  No evidence of DKA.   - sSSI s/p insulin drip for TG   Severe sepsis- criteria have resolved Presented with hypotension, leukocytosis, AKI. Cultures: NGTD. Elevated procalcitonin.  Currently hemodynamically stable.  Finished antibiotics course   Hypertriglyceridemia Root problem for pancreatitis.  Triglyceride level was in the range of 4000 on admission.  Started on insulin drip with significant improvement.  Insulin drip was stopped.  Continue high-dose Lipitor, fenofibrate.  He needs to follow-up with lipid clinic as an outpatient   Hyperlipidemia On Lipitor and fenofibrate  Body mass index is 33.91 kg/m.  VTE ppx:    Diet:     Diet   Diet full liquid Room service appropriate? Yes; Fluid consistency: Thin   Subjective 02/10/22    Pt reports complaints about the food on his diet. He states that his epigastric pain is improving. Endorses improvement of breathing.   Objective   Vitals:   02/09/22 2155 02/09/22 2210 02/10/22 0036 02/10/22 0618  BP: (!) 161/92 (!) 158/83 (!) 149/92 (!) 143/83  Pulse: (!) 115 (!) 109 (!) 103 (!) 102  Resp: '18 18 16 18  '$ Temp: 98.2 F (36.8 C) 98.1 F (36.7 C) 98.2 F (36.8 C) 97.9 F (36.6 C)  TempSrc: Oral Oral Oral Oral  SpO2: (!) 88% 96% 94% 93%  Weight:      Height:        Intake/Output Summary (Last 24 hours) at 02/10/2022 4235 Last data filed at 02/10/2022  0620 Gross per 24 hour  Intake 681.96 ml  Output 1000 ml  Net -318.04 ml   Filed Weights   02/03/22 1107  Weight: 113.4 kg     Physical Exam:  General: awake, alert, NAD HEENT: atraumatic, clear conjunctiva, anicteric sclera, MMM, hearing grossly normal Respiratory: normal respiratory effort. Cardiovascular: quick capillary refill, normal S1/S2,  RRR, no JVD, murmurs Gastrointestinal: distended, mildly tender, soft Nervous: A&O x3. no gross focal neurologic deficits, normal speech Extremities: moves all equally, no edema, normal tone Skin: dry, intact, normal temperature, normal color. No rashes, lesions or ulcers on exposed skin Psychiatry: normal mood, congruent affect  Labs   I have personally reviewed the following labs and imaging studies CBC    Component Value Date/Time   WBC 18.8 (H) 02/10/2022 0650   RBC 3.72 (L) 02/10/2022 0650   HGB 10.2 (L) 02/10/2022 0650   HCT 32.5 (L) 02/10/2022 0650   PLT 180 02/10/2022 0650   MCV 87.4 02/10/2022 0650   MCH 27.4 02/10/2022 0650   MCHC 31.4 02/10/2022 0650   RDW 15.9 (H) 02/10/2022 0650   LYMPHSABS PENDING 02/10/2022 0650   MONOABS PENDING 02/10/2022 0650   EOSABS PENDING 02/10/2022 0650   BASOSABS PENDING 02/10/2022 0650   BMP Latest Ref Rng & Units 02/10/2022 02/09/2022 02/08/2022  Glucose 70 - 99 mg/dL 214(H) 192(H) 161(H)  BUN 6 - 20 mg/dL '12 12 15  '$ Creatinine 0.61 - 1.24 mg/dL 0.67 0.60(L) 0.77  BUN/Creat Ratio 6 - 22 (calc) - - -  Sodium 135 - 145 mmol/L 134(L) 135 136  Potassium 3.5 - 5.1 mmol/L 3.7 3.4(L) 3.5  Chloride 98 - 111 mmol/L 98 98 101  CO2 22 - 32 mmol/L '29 30 24  '$ Calcium 8.9 - 10.3 mg/dL 8.2(L) 8.2(L) 8.6(L)    CT ABDOMEN PELVIS W CONTRAST  Result Date: 02/09/2022 CLINICAL DATA:  Abdominal pain EXAM: CT ABDOMEN AND PELVIS WITH CONTRAST TECHNIQUE: Multidetector CT imaging of the abdomen and pelvis was performed using the standard protocol following bolus administration of intravenous contrast. RADIATION DOSE REDUCTION: This exam was performed according to the departmental dose-optimization program which includes automated exposure control, adjustment of the mA and/or kV according to patient size and/or use of iterative reconstruction technique. CONTRAST:  143m OMNIPAQUE IOHEXOL 300 MG/ML  SOLN COMPARISON:  02/03/2022 FINDINGS: Lower chest: There is small to  moderate left pleural effusion. There is minimal right pleural effusion. Compression atelectasis is seen in the lower lung fields, more so on the left side. Hepatobiliary: Liver measures 25.4 cm. There is fatty infiltration. There is no dilation of bile ducts. Surgical clips are seen in gallbladder fossa. Pancreas: There is interval worsening of peripancreatic edema. There is no definite focal necrosis. There are no loculated fluid collections in the or around the pancreas. Spleen: Spleen measures 14 cm in maximum diameter. Adrenals/Urinary Tract: Adrenals are unremarkable. There is no hydronephrosis. There are no renal or ureteral stones. There are small parapelvic cysts in the kidneys, more so on the left side. Stomach/Bowel: Stomach is unremarkable. Small bowel loops are not dilated. Appendix is not dilated. There is no significant focal wall thickening in colon. Vascular/Lymphatic: Unremarkable. Reproductive: Unremarkable. Other: Small ascites is present. There is no pneumoperitoneum. Left inguinal hernia containing fat is seen. Small umbilical hernia containing fat is seen. There is subcutaneous edema suggesting anasarca. Musculoskeletal: There is encroachment of neural foramina from L3-S1 levels. IMPRESSION: There is interval worsening of peripancreatic edema extending into the mesentery suggesting worsening of acute pancreatitis. There  is no abnormal loculated fluid collection in or around the pancreas. There is no evidence of focal pancreatic necrosis. There is no evidence of intestinal obstruction or pneumoperitoneum. There is no hydronephrosis. Appendix is unremarkable. Enlarged fatty liver. Enlarged spleen. Parapelvic cysts seen in the kidneys. Small ascites. Other findings as described in the body of the report. Electronically Signed   By: Elmer Picker M.D.   On: 02/09/2022 13:57   DG Chest Port 1 View  Result Date: 02/08/2022 CLINICAL DATA:  Shortness of breath EXAM: PORTABLE CHEST 1 VIEW  COMPARISON:  02/03/2022 FINDINGS: Single frontal view of the chest demonstrates interval placement of a right-sided PICC tip overlying superior vena cava. Cardiac silhouette is unremarkable. Lung volumes are diminished, with crowding of the central vasculature. No airspace disease, effusion, or pneumothorax. No acute bony abnormality. IMPRESSION: 1. Low lung volumes.  No acute airspace disease. Electronically Signed   By: Randa Ngo M.D.   On: 02/08/2022 17:56    Disposition Plan & Communication  Patient status: Inpatient  Admitted From: Home Planned disposition location: Home Anticipated discharge date: 3/6 pending respiratory and abdominal improvement. Tolerating full diet  Family Communication: girlfriend at bedside    Author: Richarda Osmond, DO [facility] 02/10/2022, 8:11 AM   Available by Epic secure chat 7AM-7PM. If 7PM-7AM, please contact night-coverage.  [facility] contact information found on [email or web address].

## 2022-02-11 DIAGNOSIS — E7849 Other hyperlipidemia: Secondary | ICD-10-CM

## 2022-02-11 LAB — CBC
HCT: 31.9 % — ABNORMAL LOW (ref 39.0–52.0)
Hemoglobin: 10.2 g/dL — ABNORMAL LOW (ref 13.0–17.0)
MCH: 27.6 pg (ref 26.0–34.0)
MCHC: 32 g/dL (ref 30.0–36.0)
MCV: 86.2 fL (ref 80.0–100.0)
Platelets: 186 10*3/uL (ref 150–400)
RBC: 3.7 MIL/uL — ABNORMAL LOW (ref 4.22–5.81)
RDW: 15.7 % — ABNORMAL HIGH (ref 11.5–15.5)
WBC: 16.1 10*3/uL — ABNORMAL HIGH (ref 4.0–10.5)
nRBC: 0 % (ref 0.0–0.2)

## 2022-02-11 LAB — COMPREHENSIVE METABOLIC PANEL
ALT: 22 U/L (ref 0–44)
AST: 31 U/L (ref 15–41)
Albumin: 1.8 g/dL — ABNORMAL LOW (ref 3.5–5.0)
Alkaline Phosphatase: 87 U/L (ref 38–126)
Anion gap: 8 (ref 5–15)
BUN: 10 mg/dL (ref 6–20)
CO2: 28 mmol/L (ref 22–32)
Calcium: 8.1 mg/dL — ABNORMAL LOW (ref 8.9–10.3)
Chloride: 98 mmol/L (ref 98–111)
Creatinine, Ser: 0.65 mg/dL (ref 0.61–1.24)
GFR, Estimated: >60 mL/min (ref >60)
Glucose, Bld: 210 mg/dL — ABNORMAL HIGH (ref 70–99)
Potassium: 3.9 mmol/L (ref 3.5–5.1)
Sodium: 134 mmol/L — ABNORMAL LOW (ref 135–145)
Total Bilirubin: 0.5 mg/dL (ref 0.3–1.2)
Total Protein: 5.3 g/dL — ABNORMAL LOW (ref 6.5–8.1)

## 2022-02-11 LAB — GLUCOSE, CAPILLARY
Glucose-Capillary: 191 mg/dL — ABNORMAL HIGH (ref 70–99)
Glucose-Capillary: 204 mg/dL — ABNORMAL HIGH (ref 70–99)
Glucose-Capillary: 205 mg/dL — ABNORMAL HIGH (ref 70–99)
Glucose-Capillary: 218 mg/dL — ABNORMAL HIGH (ref 70–99)
Glucose-Capillary: 218 mg/dL — ABNORMAL HIGH (ref 70–99)

## 2022-02-11 LAB — TRIGLYCERIDES: Triglycerides: 241 mg/dL — ABNORMAL HIGH (ref <150)

## 2022-02-11 NOTE — Discharge Summary (Signed)
Physician Discharge Summary  Patient: Kevin Miller JGG:[id number] DOB: [birth year]   Code Status: Full Code Admit date: 02/03/2022 Discharge date: 02/12/2022 Disposition: Home, No home health services recommended PCP: Biagio Borg, MD  Recommendations for Outpatient Follow-up:  Follow up with PCP within 1-2 weeks Monitor blood glucose. Titrate back on diabetes medications as tolerated. Held in setting of acute pancreatis as shown below Continue diabetes education Monitor triglycerides. 241 day prior to discharge Follow up with GI for pancreatitis f/u  Discharge Diagnoses:  Principal Problem:   Acute recurrent pancreatitis Active Problems:   Hyperlipidemia   Hypertriglyceridemia   Severe sepsis (HCC)   Diabetes mellitus without complication (HCC)   HTN (hypertension)   AKI (acute kidney injury) ([place])   Lactic acidosis   Acute bronchiolitis   SOB (shortness of breath)  Brief Hospital Course Summary: Pt Kevin Miller is a 61 y.o. male with a PMH significant for pancreatitis, pancreatic pseudocyst, HLD, hypertriglyceridemia, HTN, Type II DM, C difficile, hypogonadism. They presented from home to the ED on 02/03/2022 with abdominal pain x 2 days. He did not have N/V/D.  He was found to have several conditions requiring attention.  In the ED, it was found that they had hypotension which resolved with treatment of underlying illnesses as listed below.  Abdominal CT 2/25 showed acute pancreatitis 2/2 hypertriglyceridemia- initially 4252. He was treated with insulin drip and frequent triglyceride monitoring. His tg improved to 300s prior to dc. His pain worsened in early stay so CT was repeated 3/3 which showed worsening edema peripancreas area without further complications. Other incidental findings per report. With analgesia and fluid support, patient had improvement over next several days. Patient was able to tolerate a normal diet on day of discharge. Gi followed and signed off  for outpatient f/u.  Additionally, he was found to be hypoxic on admission requiring up to 2L [place]. thought to be due to bronchitis seen on chest xray. He was weaned to room air with symptomatic care.   Type II DM non-insulin dependent prior to admission but had hyperglycemia in setting of holding his home medications and diet changes in setting of acute pancreatitis. He received diabetes education and nutrition consult to discuss diet changes prior to discharge. Will need close follow up with PCP for diabetes management.    Discharge Condition: Good, improved Recommended discharge diet: Diabetic diet  Consultations: GI   Procedures/Studies: Insulin gtt   Allergies as of 02/12/2022       Reactions   Gemfibrozil    REACTION: abn LFTs        Medication List     STOP taking these medications    b complex vitamins tablet   cyanocobalamin 1000 MCG/ML injection Commonly known as: (VITAMIN B-12)   dapagliflozin propanediol 10 MG Tabs tablet Commonly known as: Farxiga   Diclofenac Sodium 2 % Soln Commonly known as: Pennsaid   glipiZIDE 5 MG 24 hr tablet Commonly known as: GLUCOTROL XL   metFORMIN 500 MG tablet Commonly known as: GLUCOPHAGE   naproxen 500 MG tablet Commonly known as: NAPROSYN   omega-3 acid ethyl esters 1 g capsule Commonly known as: LOVAZA   Thera-D 2000 50 MCG (2000 UT) Tabs Generic drug: Cholecalciferol   VITAMIN K PO       TAKE these medications    acetaminophen 500 MG tablet Commonly known as: TYLENOL Take 1,000 mg by mouth every 6 (six) hours as needed for mild pain.   amLODipine 10 MG tablet  Commonly known as: NORVASC Take 1 tablet (10 mg total) by mouth daily. Start taking on: February 13, 2022 What changed:  medication strength how much to take how to take this when to take this additional instructions   atorvastatin 80 MG tablet Commonly known as: LIPITOR Take 1 tablet (80 mg total) by mouth daily at 6 PM. What changed:   medication strength how much to take   fenofibrate 160 MG tablet Take 1 tablet (160 mg total) by mouth daily. Start taking on: February 13, 2022 What changed:  medication strength how much to take   glucose blood test strip Commonly known as: ONE TOUCH ULTRA TEST USE ONCE DAILY AS NEEDED   insulin detemir 100 UNIT/ML FlexPen Commonly known as: LEVEMIR Inject 10 Units into the skin daily.   losartan 100 MG tablet Commonly known as: COZAAR Take 1 tablet (100 mg total) by mouth daily.   metoprolol tartrate 100 MG tablet Commonly known as: LOPRESSOR Take 1 tablet (100 mg total) by mouth 2 (two) times daily. What changed:  medication strength how much to take         Subjective   Pt reports resolved abdominal pain. Able to tolerate normal diet. Expresses anxiety over the correct diet and medications to prevent him from getting pancreatitis again. We discussed that he is at risk of getting it again and we had nutritionist and diabetes educator see him prior to discharge.  Work note provided.  Objective  Blood pressure (!) 161/89, pulse 96, temperature 98.6 F (37 C), resp. rate 19, height 6' (1.829 m), weight 113.4 kg, SpO2 94 %.   General: Pt is alert, awake, not in acute distress Cardiovascular: RRR, S1/S2 +, no rubs, no gallops Respiratory: CTA bilaterally, no wheezing, no rhonchi Abdominal: Soft, NT, ND, bowel sounds + Extremities: no edema, no cyanosis   The results of significant diagnostics from this hospitalization (including imaging, microbiology, ancillary and laboratory) are listed below for reference.   Imaging studies: CT ABDOMEN PELVIS WO CONTRAST  Result Date: 02/03/2022 CLINICAL DATA:  Abdominal pain and distension since yesterday. EXAM: CT ABDOMEN AND PELVIS WITHOUT CONTRAST TECHNIQUE: Multidetector CT imaging of the abdomen and pelvis was performed following the standard protocol without IV contrast. RADIATION DOSE REDUCTION: This exam was performed  according to the departmental dose-optimization program which includes automated exposure control, adjustment of the mA and/or kV according to patient size and/or use of iterative reconstruction technique. COMPARISON:  01/13/2010. FINDINGS: Lower chest: No acute abnormality. Hepatobiliary: Liver mildly enlarged and diffusely decreased in attenuation. No liver mass or focal lesion. Gallbladder surgically absent. No bile duct dilation. Pancreas: Peripancreatic inflammation that tracks along the gastrohepatic and gastro duodenal ligaments and anterior pararenal fascia, right greater than left. Trace amount of associated ascites. No discrete fluid collection. Spleen: Normal in size without focal abnormality. Adrenals/Urinary Tract: Adrenal glands are unremarkable. Kidneys are normal, without renal calculi, focal lesion, or hydronephrosis. Bladder is unremarkable. Stomach/Bowel: Stomach distended, otherwise unremarkable. Duodenal wall is thickened with adjacent inflammation, that is most likely reactive to adjacent pancreatic inflammation. Small bowel and colon are normal in caliber. No wall thickening. No other areas of inflammation. Normal appendix visualized. Vascular/Lymphatic: No vascular abnormality. No enlarged lymph nodes. Reproductive: Unremarkable. Other: None. Musculoskeletal: No fracture or acute finding.  No bone lesion. IMPRESSION: 1. Findings consistent with acute pancreatitis, with significant peripancreatic inflammatory change and a trace amount of ascites. No discrete acute peripancreatic fluid collection. 2. Wall thickening and inflammation of the duodenum, which is  most likely reactive. Consider duodenitis if there are in consistent clinical findings for pancreatitis. 3. No other acute abnormality within the abdomen or pelvis. 4. Mild hepatomegaly with extensive hepatic steatosis. Electronically Signed   By: Lajean Manes M.D.   On: 02/03/2022 13:23   DG Chest 2 View  Result Date:  02/03/2022 CLINICAL DATA:  61 year old male with history of shortness of breath. EXAM: CHEST - 2 VIEW COMPARISON:  Chest x-ray 02/17/2010. FINDINGS: Lung volumes are low. Patchy ill-defined opacities and areas of interstitial prominence are noted in the mid to lower lungs bilaterally where there is also diffuse peribronchial cuffing. No pleural effusions. No pneumothorax. No evidence of pulmonary edema. Heart size is normal. Upper mediastinal contours are within normal limits. IMPRESSION: 1. The appearance the chest is concerning for bronchitis and developing multilobar bilateral bronchopneumonia. Electronically Signed   By: Vinnie Langton M.D.   On: 02/03/2022 11:55   DG Abd 1 View  Result Date: 02/06/2022 CLINICAL DATA:  Abdominal pain EXAM: ABDOMEN - 1 VIEW COMPARISON:  CT abdomen 02/03/2022 FINDINGS: The bowel gas pattern is normal. No radio-opaque calculi or other significant radiographic abnormality are seen. IMPRESSION: Negative. Electronically Signed   By: Kathreen Devoid M.D.   On: 02/06/2022 13:56   CT ABDOMEN PELVIS W CONTRAST  Result Date: 02/09/2022 CLINICAL DATA:  Abdominal pain EXAM: CT ABDOMEN AND PELVIS WITH CONTRAST TECHNIQUE: Multidetector CT imaging of the abdomen and pelvis was performed using the standard protocol following bolus administration of intravenous contrast. RADIATION DOSE REDUCTION: This exam was performed according to the departmental dose-optimization program which includes automated exposure control, adjustment of the mA and/or kV according to patient size and/or use of iterative reconstruction technique. CONTRAST:  134m OMNIPAQUE IOHEXOL 300 MG/ML  SOLN COMPARISON:  02/03/2022 FINDINGS: Lower chest: There is small to moderate left pleural effusion. There is minimal right pleural effusion. Compression atelectasis is seen in the lower lung fields, more so on the left side. Hepatobiliary: Liver measures 25.4 cm. There is fatty infiltration. There is no dilation of bile  ducts. Surgical clips are seen in gallbladder fossa. Pancreas: There is interval worsening of peripancreatic edema. There is no definite focal necrosis. There are no loculated fluid collections in the or around the pancreas. Spleen: Spleen measures 14 cm in maximum diameter. Adrenals/Urinary Tract: Adrenals are unremarkable. There is no hydronephrosis. There are no renal or ureteral stones. There are small parapelvic cysts in the kidneys, more so on the left side. Stomach/Bowel: Stomach is unremarkable. Small bowel loops are not dilated. Appendix is not dilated. There is no significant focal wall thickening in colon. Vascular/Lymphatic: Unremarkable. Reproductive: Unremarkable. Other: Small ascites is present. There is no pneumoperitoneum. Left inguinal hernia containing fat is seen. Small umbilical hernia containing fat is seen. There is subcutaneous edema suggesting anasarca. Musculoskeletal: There is encroachment of neural foramina from L3-S1 levels. IMPRESSION: There is interval worsening of peripancreatic edema extending into the mesentery suggesting worsening of acute pancreatitis. There is no abnormal loculated fluid collection in or around the pancreas. There is no evidence of focal pancreatic necrosis. There is no evidence of intestinal obstruction or pneumoperitoneum. There is no hydronephrosis. Appendix is unremarkable. Enlarged fatty liver. Enlarged spleen. Parapelvic cysts seen in the kidneys. Small ascites. Other findings as described in the body of the report. Electronically Signed   By: PElmer PickerM.D.   On: 02/09/2022 13:57   DG Chest Port 1 View  Result Date: 02/08/2022 CLINICAL DATA:  Shortness of breath EXAM: PORTABLE CHEST  1 VIEW COMPARISON:  02/03/2022 FINDINGS: Single frontal view of the chest demonstrates interval placement of a right-sided PICC tip overlying superior vena cava. Cardiac silhouette is unremarkable. Lung volumes are diminished, with crowding of the central  vasculature. No airspace disease, effusion, or pneumothorax. No acute bony abnormality. IMPRESSION: 1. Low lung volumes.  No acute airspace disease. Electronically Signed   By: Randa Ngo M.D.   On: 02/08/2022 17:56   [place] EKG SITE RITE  Result Date: 02/04/2022 If [facility] image not attached, placement could not be confirmed due to current cardiac rhythm.   Labs: Basic Metabolic Panel: Recent Labs  Lab 02/07/22 0627 02/08/22 0631 02/09/22 0605 02/10/22 0650 02/11/22 0500  NA 137 136 135 134* 134*  K 3.9 3.5 3.4* 3.7 3.9  CL 102 101 98 98 98  CO2 19* '24 30 29 28  '$ GLUCOSE 150* 161* 192* 214* 210*  BUN '16 15 12 12 10  '$ CREATININE 0.83 0.77 0.60* 0.67 0.65  CALCIUM 8.3* 8.6* 8.2* 8.2* 8.1*   CBC: Recent Labs  Lab 02/06/22 0435 02/07/22 0627 02/09/22 0605 02/10/22 0650 02/11/22 0500  WBC 10.5 11.1* 17.7* 18.8* 16.1*  NEUTROABS 8.3* 7.9* 13.2* 15.0*  --   HGB 10.8* 10.8* 10.5* 10.2* 10.2*  HCT 33.5* 33.2* 33.1* 32.5* 31.9*  MCV 87.0 85.8 86.6 87.4 86.2  PLT 195 271 215 180 186   Microbiology: None   Time coordinating discharge: Over 30 minutes  Richarda Osmond, MD  [facility] 02/12/2022, 11:56 AM

## 2022-02-11 NOTE — Progress Notes (Signed)
PROGRESS NOTE  Kevin Miller    DOB: [birth year], 61 y.o.  VFI:[id number]    Code Status: Full Code   DOA: 02/03/2022   LOS: 8   Brief hospital course  Kevin Miller is a 61 y.o. male with a PMH significant for pancreatitis, pancreatic pseudocyst, HLD, hypertriglyceridemia, HTN, Type II DM, C difficile, hypogonadism. They presented from home to the ED on 02/03/2022 with abdominal pain x 2 days. He did not have N/V/D.  In the ED, it was found that they had hypotension abdominal CT showed acute pancreatitis. He also had hypertriglyceridemia with 4252. Additionally, he was found to have bronchitis on chest xray.  They were treated with insulin drip, supplemental oxygen, and analgesia. GI was consulted.  Patient was admitted to medicine service for further workup and management of pancreatitis, bronchitis as outlined in detail below.  02/11/22 -stable  Assessment & Plan  Principal Problem:   Acute recurrent pancreatitis Active Problems:   Hyperlipidemia   Hypertriglyceridemia   Severe sepsis (HCC)   Diabetes mellitus without complication (HCC)   HTN (hypertension)   AKI (acute kidney injury) (HCC)   Lactic acidosis   Acute bronchiolitis   SOB (shortness of breath)  Acute recurrent pancreatitis- CT abdomen/pelvis showed acute pancreatitis with significant peripancreatic inflammatory change, trace amount of ascites.  No discrete acute peripancreatic fluid collection.  Also showed thickening and inflammation of duodenum most likely reactive.  Pancreatitis secondary to hypertriglyceridemia. Abd xray does not show ileus.  Abdominal distention has improved.  Improved abdominal pain.  He had bowel movements.  Diet advanced to softs. Repeat CT without significant change. Pt states his pain is currently about 1/10. - GI signed off - advance diet as tolerated - holding home DM treatments   Acute bronchiolitis Now on room air  Chest x-ray showed features of bronchitis/developing  multilobular bilateral pneumonia.  Antibiotics changed to oral, completed course.  Follow-up chest x-ray yesterday did not show any acute findings.   AKI - Resolved.    HTN - mildly elevated - continue amlodipine, losartan, metoprolol   Diabetes mellitus without complication ([place]) Takes glipizide, Farxiga at home.  No evidence of DKA.   - sSSI s/p insulin drip for TG - nutrition consult, per patient request   Severe sepsis- criteria have resolved Presented with hypotension, leukocytosis, AKI. Cultures: NGTD. Elevated procalcitonin.  Currently hemodynamically stable.  Finished antibiotics course   Hypertriglyceridemia Root problem for pancreatitis.  Triglyceride level was in the range of 4000 on admission now <300 s/p insulin drip. Continue high-dose Lipitor, fenofibrate.  He needs to follow-up with lipid clinic as an outpatient   Hyperlipidemia On Lipitor and fenofibrate  Body mass index is 33.91 kg/m.  VTE ppx:    Diet:     Diet   DIET SOFT Room service appropriate? Yes; Fluid consistency: Thin   Subjective 02/11/22    Pt reports improvement in pain. Able to tolerate soft diet. Complaints about the food.    Objective   Vitals:   02/10/22 1745 02/10/22 2202 02/10/22 2326 02/11/22 0350  BP:  (!) 155/84 (!) 149/82 (!) 161/89  Pulse: (!) 109 (!) 103 (!) 108 96  Resp:  '18 18 19  '$ Temp:  98.2 F (36.8 C) 98.2 F (36.8 C) 98.6 F (37 C)  TempSrc:  Oral    SpO2:  95% 93% 94%  Weight:      Height:        Intake/Output Summary (Last 24 hours) at 02/11/2022 0719 Last data filed  at 02/10/2022 2235 Gross per 24 hour  Intake 1732.06 ml  Output --  Net 1732.06 ml    Filed Weights   02/03/22 1107  Weight: 113.4 kg     Physical Exam:  General: awake, alert, NAD HEENT: atraumatic, clear conjunctiva, anicteric sclera, MMM, hearing grossly normal Respiratory: normal respiratory effort. Cardiovascular: quick capillary refill, normal S1/S2, RRR, no JVD,  murmurs Gastrointestinal: distended, mildly tender, soft Nervous: A&O x3. no gross focal neurologic deficits, normal speech Extremities: moves all equally, no edema, normal tone Skin: dry, intact, normal temperature, normal color. No rashes, lesions or ulcers on exposed skin Psychiatry: normal mood, congruent affect  Labs   I have personally reviewed the following labs and imaging studies CBC    Component Value Date/Time   WBC 16.1 (H) 02/11/2022 0500   RBC 3.70 (L) 02/11/2022 0500   HGB 10.2 (L) 02/11/2022 0500   HCT 31.9 (L) 02/11/2022 0500   PLT 186 02/11/2022 0500   MCV 86.2 02/11/2022 0500   MCH 27.6 02/11/2022 0500   MCHC 32.0 02/11/2022 0500   RDW 15.7 (H) 02/11/2022 0500   LYMPHSABS 0.9 02/10/2022 0650   MONOABS 1.3 (H) 02/10/2022 0650   EOSABS 0.2 02/10/2022 0650   BASOSABS 0.1 02/10/2022 0650   BMP Latest Ref Rng & Units 02/11/2022 02/10/2022 02/09/2022  Glucose 70 - 99 mg/dL 210(H) 214(H) 192(H)  BUN 6 - 20 mg/dL '10 12 12  '$ Creatinine 0.61 - 1.24 mg/dL 0.65 0.67 0.60(L)  BUN/Creat Ratio 6 - 22 (calc) - - -  Sodium 135 - 145 mmol/L 134(L) 134(L) 135  Potassium 3.5 - 5.1 mmol/L 3.9 3.7 3.4(L)  Chloride 98 - 111 mmol/L 98 98 98  CO2 22 - 32 mmol/L '28 29 30  '$ Calcium 8.9 - 10.3 mg/dL 8.1(L) 8.2(L) 8.2(L)    CT ABDOMEN PELVIS W CONTRAST  Result Date: 02/09/2022 CLINICAL DATA:  Abdominal pain EXAM: CT ABDOMEN AND PELVIS WITH CONTRAST TECHNIQUE: Multidetector CT imaging of the abdomen and pelvis was performed using the standard protocol following bolus administration of intravenous contrast. RADIATION DOSE REDUCTION: This exam was performed according to the departmental dose-optimization program which includes automated exposure control, adjustment of the mA and/or kV according to patient size and/or use of iterative reconstruction technique. CONTRAST:  185m OMNIPAQUE IOHEXOL 300 MG/ML  SOLN COMPARISON:  02/03/2022 FINDINGS: Lower chest: There is small to moderate left pleural  effusion. There is minimal right pleural effusion. Compression atelectasis is seen in the lower lung fields, more so on the left side. Hepatobiliary: Liver measures 25.4 cm. There is fatty infiltration. There is no dilation of bile ducts. Surgical clips are seen in gallbladder fossa. Pancreas: There is interval worsening of peripancreatic edema. There is no definite focal necrosis. There are no loculated fluid collections in the or around the pancreas. Spleen: Spleen measures 14 cm in maximum diameter. Adrenals/Urinary Tract: Adrenals are unremarkable. There is no hydronephrosis. There are no renal or ureteral stones. There are small parapelvic cysts in the kidneys, more so on the left side. Stomach/Bowel: Stomach is unremarkable. Small bowel loops are not dilated. Appendix is not dilated. There is no significant focal wall thickening in colon. Vascular/Lymphatic: Unremarkable. Reproductive: Unremarkable. Other: Small ascites is present. There is no pneumoperitoneum. Left inguinal hernia containing fat is seen. Small umbilical hernia containing fat is seen. There is subcutaneous edema suggesting anasarca. Musculoskeletal: There is encroachment of neural foramina from L3-S1 levels. IMPRESSION: There is interval worsening of peripancreatic edema extending into the mesentery suggesting worsening of  acute pancreatitis. There is no abnormal loculated fluid collection in or around the pancreas. There is no evidence of focal pancreatic necrosis. There is no evidence of intestinal obstruction or pneumoperitoneum. There is no hydronephrosis. Appendix is unremarkable. Enlarged fatty liver. Enlarged spleen. Parapelvic cysts seen in the kidneys. Small ascites. Other findings as described in the body of the report. Electronically Signed   By: Elmer Picker M.D.   On: 02/09/2022 13:57    Disposition Plan & Communication  Patient status: Inpatient  Admitted From: Home Planned disposition location: Home Anticipated  discharge date: 3/6 pending Tolerating full diet  Family Communication: girlfriend at bedside    Author: Richarda Osmond, DO [facility] 02/11/2022, 7:19 AM   Available by Epic secure chat 7AM-7PM. If 7PM-7AM, please contact night-coverage.  [facility] contact information found on [email or web address].

## 2022-02-12 LAB — GLUCOSE, CAPILLARY
Glucose-Capillary: 236 mg/dL — ABNORMAL HIGH (ref 70–99)
Glucose-Capillary: 233 mg/dL — ABNORMAL HIGH (ref 70–99)

## 2022-02-12 MED ORDER — METOPROLOL TARTRATE 100 MG PO TABS: 100.0000 mg | ORAL_TABLET | Freq: Two times a day (BID) | ORAL | 0 refills | Status: DC

## 2022-02-12 MED ORDER — FENOFIBRATE 160 MG PO TABS: 160.0000 mg | ORAL_TABLET | Freq: Every day | ORAL | 0 refills | Status: DC

## 2022-02-12 MED ORDER — ATORVASTATIN CALCIUM 80 MG PO TABS: 80.0000 mg | ORAL_TABLET | Freq: Every day | ORAL | 0 refills | Status: DC

## 2022-02-12 MED ORDER — "PEN NEEDLES 3/16"" 31G X 5 MM MISC": 1.0000 | Freq: Every day | 0 refills | Status: DC

## 2022-02-12 MED ORDER — INSULIN DETEMIR 100 UNIT/ML FLEXPEN: 10.0000 [IU] | PEN_INJECTOR | Freq: Every day | SUBCUTANEOUS | 11 refills | Status: DC

## 2022-02-12 MED ORDER — AMLODIPINE BESYLATE 10 MG PO TABS: 10.0000 mg | ORAL_TABLET | Freq: Every day | ORAL | 0 refills | Status: DC

## 2022-02-12 MED ORDER — TRAMADOL HCL 50 MG PO TABS: 50.0000 mg | ORAL_TABLET | Freq: Two times a day (BID) | ORAL | 0 refills | Status: AC | PRN

## 2022-02-12 NOTE — Progress Notes (Addendum)
Inpatient Diabetes Program Recommendations ? ?AACE/ADA: New Consensus Statement on Inpatient Glycemic Control (2015) ? ?Target Ranges:  Prepandial:   less than 140 mg/dL ?     Peak postprandial:   less than 180 mg/dL (1-2 hours) ?     Critically ill patients:  140 - 180 mg/dL  ? ? Latest Reference Range & Units 02/12/22 07:27 02/12/22 11:53  ?Glucose-Capillary 70 - 99 mg/dL 236 (H) ? ?5 units Novolog ? 233 (H)  ?(H): Data is abnormally high ? ? ?Admit with: Acute recurrent pancreatitis ? ?History: DM ? ?Home DM Meds: Farxiga 10 mg daily ?       Glipizide 5 mg daily  ?       Metformin 500 mg TID  ? ? ?Current Orders: Novolog Moderate Correction Scale/ SSI (0-15 units) TID AC + HS ?           ? ? ?To D/C home today with Rx for Levemir 10 units Daily ?Stop Farxiga, Metformin, and Glipizide ?Follow up with PCP Dr. Cathlean Cower ? ?Met w/ pt at bedside.  Pt was able to give insulin injection with syringe with RN successfully.  Discussed with pt that Dr. Ouida Sills plans to stop the 3 oral diabetes meds pt was taking at home and send pt home on 1 injection of Levemir insulin daily.  Explained what Levemir is, how it works, when to take, importance of consistency of timing the insulin daily, importance of checking AM CBG (fasting) daily.  Asked pt to check his CBGs at least TID [facility] for the next week to get enough data to review with his PCP.  Also encouraged pt to let PCP know he was started on insulin (at his next appt which is 02/16/2022.  Reviewed healthy CBG levels.  Pt requested Youtube video on insulin pens and video was emailed to him this afternoon. ? ?Educated patient on insulin pen use at home.  Reviewed all steps of insulin pen including attachment of needle, 2-unit air shot, dialing up dose, giving injection, rotation of injection sites, removing needle, disposal of sharps, storage of unused insulin, disposal of insulin etc.  Patient able to provide successful return demonstration.  Reviewed troubleshooting with  insulin pen.  Also reviewed Signs/Symptoms of Hypoglycemia with patient and how to treat Hypoglycemia at home.   ? ? ? ?--Will follow patient during hospitalization-- ? ?Wyn Quaker RN, MSN, CDE ?Diabetes Coordinator ?Inpatient Glycemic Control Team ?Team Pager: [phone] (8a-5p) ? ?

## 2022-02-12 NOTE — Discharge Instructions (Signed)
I'm glad you're feeling better.  ?Please follow up with your primary doctor within the next week to monitor your blood pressure, blood sugars, and cholesterol levels as you have had multiple changes to your previous home medications from this hospitalization. The changes are listed here in your paperwork.  ?I have started daily insulin to take in the morning after you have checked your blood sugars. Please take as long as your morning blood sugar is above 100. You will likely need this as your other diabetic medications have been held while you are recovering from pancreatitis but you can discuss restarting them with your primary doctor. ?I have consulted diabetes education and a nutritionist to follow up with you outpatient.  ? ?Please follow up with GI as they have instructed through their office.  ?

## 2022-02-12 NOTE — Plan of Care (Signed)
?  RD consulted for nutrition education regarding diabetes.  ? ?Lab Results  ?Component Value Date  ? HGBA1C 8.5 (H) 09/15/2021  ? ?Case discussed with RN. Pt to discharge home on insulin today.  ? ?Spoke with pt at bedside, reports feeling frustrated over his diet, especially due to limited tolerance of solid food. Pt consumed some mashed potatoes and grilled chicken today. Discussed ways to optimize nutrition for DM and pancreatitis. NDES referral also ordered.  ? ?RD provided "Carbohydrate Counting for People with Diabetes" handout from the Academy of Nutrition and Dietetics. Discussed different food groups and their effects on blood sugar, emphasizing carbohydrate-containing foods. Provided list of carbohydrates and recommended serving sizes of common foods. ? ?Discussed importance of controlled and consistent carbohydrate intake throughout the day. Provided examples of ways to balance meals/snacks and encouraged intake of high-fiber, whole grain complex carbohydrates. Teach back method used. ? ?Expect fair to good compliance. ? ?Current diet order is regulkar, patient is consuming approximately 80-100% of meals at this time. Labs and medications reviewed. No further nutrition interventions warranted at this time. RD contact information provided. If additional nutrition issues arise, please re-consult RD. ? ?Kevin Miller, RD, LDN, CDCES ?Registered Dietitian II ?Certified Diabetes Care and Education Specialist ?Please refer to [facility] for RD and/or RD on-call/weekend/after hours pager   ?

## 2022-02-13 ENCOUNTER — Telehealth: Payer: Self-pay

## 2022-02-13 NOTE — Telephone Encounter (Signed)
Transition Care Management Follow-up Telephone Call ?Date of discharge and from where: TCM DC [facility] 02-12-22 Dx: acute recurrent pancreatitis ?How have you been since you were released from the hospital? Doing ok  ?Any questions or concerns? No ? ?Items Reviewed: ?Did the pt receive and understand the discharge instructions provided? Yes  ?Medications obtained and verified? Yes  ?Other? No  ?Any new allergies since your discharge? No  ?Dietary orders reviewed? Yes ?Do you have support at home? Yes  ? ?Home Care and Equipment/Supplies: ?Were home health services ordered? no ?If so, what is the name of the agency? na  ?Has the agency set up a time to come to the patient's home? not applicable ?Were any new equipment or medical supplies ordered?  No ?What is the name of the medical supply agency? na ?Were you able to get the supplies/equipment? not applicable ?Do you have any questions related to the use of the equipment or supplies? No ? ?Functional Questionnaire: (I = Independent and D = Dependent) ?ADLs: I ? ?Bathing/Dressing- I ? ?Meal Prep- I ? ?Eating- I ? ?Maintaining continence- I ? ?Transferring/Ambulation- I ? ?Managing Meds- I ? ?Follow up appointments reviewed: ? ?PCP Hospital f/u appt confirmed? Yes  Scheduled to see Dr Jenny Reichmann on 02-16-22 @ 340pm. ?[facility] f/u appt confirmed? No pt plans to call today and make appt with Dr [facility] ?Are transportation arrangements needed? No  ?If their condition worsens, is the pt aware to call PCP or go to the Emergency Dept.? Yes ?Was the patient provided with contact information for the PCP's office or ED? Yes ?Was to pt encouraged to call back with questions or concerns? Yes  ?

## 2022-02-16 ENCOUNTER — Encounter: Payer: Self-pay | Admitting: Internal Medicine

## 2022-02-16 ENCOUNTER — Other Ambulatory Visit: Payer: Self-pay

## 2022-02-16 ENCOUNTER — Ambulatory Visit: Payer: 59 | Admitting: Internal Medicine

## 2022-02-16 VITALS — BP 118/66 | HR 95 | Temp 98.3°F | Ht 72.0 in | Wt 257.0 lb

## 2022-02-16 DIAGNOSIS — K859 Acute pancreatitis without necrosis or infection, unspecified: Secondary | ICD-10-CM

## 2022-02-16 DIAGNOSIS — I10 Essential (primary) hypertension: Secondary | ICD-10-CM | POA: Diagnosis not present

## 2022-02-16 DIAGNOSIS — E119 Type 2 diabetes mellitus without complications: Secondary | ICD-10-CM

## 2022-02-16 MED ORDER — TRAMADOL HCL 50 MG PO TABS: 50.0000 mg | ORAL_TABLET | Freq: Four times a day (QID) | ORAL | 0 refills | Status: DC | PRN

## 2022-02-16 NOTE — Progress Notes (Addendum)
Patient ID: Kevin Miller, male   DOB: 05-07-1961, 61 y.o.   MRN: [id number] ? ? ? ?    Chief Complaint: follow up post hospn 2/25 - 3/6 ? ?     HPI:  Kevin Miller is a 61 y.o. male here with above after admitted poor high fat diet with acute recurrent pancreatitis due to very severe hypertriglyceridemia 4252 (1 other episode in past thought related to GB now s/p CCX) ;  Initially thought with severe sepsis but serial Ct revealed worsening edema peripancreatic.  Additionally, he was found to be hypoxic on admission requiring up to 2L [place]. thought to be due to bronchitis seen on chest xray. He was weaned to room air with symptomatic care.  Severe hyperglycemia at outset required holding home po meds and started insulin with d/c on levemir 10 units.  CBGs at home have been mostly 200 - 250.  Pt denies chest pain, increased sob or doe, wheezing, orthopnea, PND, increased LE swelling, palpitations, dizziness or syncope.   Pt denies polydipsia, polyuria, or new focal neuro s/s.   Still has diffuse abd tender discomfort, needs tramadol qhs for sleep, but o/w Denies worsening reflux, abd pain, dysphagia, n/v, bowel change or blood.    Also tolerating fenofibrate 160 and lipitor 80.  BP has has been stable.  No other new complaints ?      ?Transitional Care Management elements noted today: ?1)  Date of D/C: as above ?2)  Medication reconciliation:  done today at end visit ?3)  Review of D/C summary or other information:  done today ?4)  Review of need for f/u on pending diagnostic tests and treatments:  done today  none ?5)  Review of need for Interaction with other providers who will assume or resume care of pt specific problems: done today - none ?6)  Education of patient/family/guardian or caregiver: none needed today ? ?Wt Readings from Last 3 Encounters:  ?02/16/22 257 lb (116.6 kg)  ?02/03/22 250 lb (113.4 kg)  ?09/22/21 254 lb (115.2 kg)  ? ?BP Readings from Last 3 Encounters:  ?02/16/22 118/66  ?02/12/22 (!)  144/87  ?09/22/21 138/76  ? ?      ?Past Medical History:  ?Diagnosis Date  ? Clostridium difficile infection 2010  ? Diabetes mellitus   ? Elevated LFTs 2011  ? Fatty liver   ? Hyperlipidemia   ? Hypertension   ? Hypogonadism male 2012  ? Leg fracture, left   ? metal rod  ? Neck pain   ? Left  ? Osteopenia 2011  ? back pain  ? Pancreatic pseudocyst   ? Resolved  ? Pancreatitis 2010  ? ? etiol  ? Vitamin B 12 deficiency   ? ?Past Surgical History:  ?Procedure Laterality Date  ? CHOLECYSTECTOMY    ? ELBOW SURGERY    ? right  ? HERNIA REPAIR    ? LEG SURGERY    ? Left fracture  ? TIBIA FRACTURE SURGERY    ? left  ? ? reports that he has never smoked. He has never used smokeless tobacco. He reports current alcohol use. He reports that he does not use drugs. ?family history includes Alcohol abuse in his father; COPD in his mother; Colon polyps in his father; Peripheral vascular disease in his mother. ?Allergies  ?Allergen Reactions  ? Gemfibrozil   ?  REACTION: abn LFTs  ? ?Current Outpatient Medications on File Prior to Visit  ?Medication Sig Dispense Refill  ? acetaminophen (TYLENOL)  500 MG tablet Take 1,000 mg by mouth every 6 (six) hours as needed for mild pain.    ? amLODipine (NORVASC) 10 MG tablet Take 1 tablet (10 mg total) by mouth daily. 30 tablet 0  ? atorvastatin (LIPITOR) 80 MG tablet Take 1 tablet (80 mg total) by mouth daily at 6 PM. 30 tablet 0  ? fenofibrate 160 MG tablet Take 1 tablet (160 mg total) by mouth daily. 30 tablet 0  ? Glucose Blood (RELION PREMIER TEST VI) by In Vitro route 2 (two) times daily at 10 AM and 5 PM.    ? insulin detemir (LEVEMIR) 100 UNIT/ML FlexPen Inject 10 Units into the skin daily. 15 mL 11  ? Insulin Pen Needle (PEN NEEDLES 3/16") 31G X 5 MM MISC 1 each by Does not apply route daily. 100 each 0  ? losartan (COZAAR) 100 MG tablet Take 1 tablet (100 mg total) by mouth daily. 90 tablet 3  ? metoprolol tartrate (LOPRESSOR) 100 MG tablet Take 1 tablet (100 mg total) by mouth 2  (two) times daily. 60 tablet 0  ? ?No current facility-administered medications on file prior to visit.  ? ?     ROS:  All others reviewed and negative. ? ?Objective  ? ?     PE:  BP 118/66 (BP Location: Right Arm, Patient Position: Sitting, Cuff Size: Large)   Pulse 95   Temp 98.3 ?F (36.8 ?C) (Oral)   Ht 6' (1.829 m)   Wt 257 lb (116.6 kg)   SpO2 97%   BMI 34.86 kg/m?  ? ?              Constitutional: Pt appears in NAD ?              HENT: Head: NCAT.  ?              Right Ear: External ear normal.   ?              Left Ear: External ear normal.  ?              Eyes: . Pupils are equal, round, and reactive to light. Conjunctivae and EOM are normal ?              Nose: without d/c or deformity ?              Neck: Neck supple. Gross normal ROM ?              Cardiovascular: Normal rate and regular rhythm.   ?              Pulmonary/Chest: Effort normal and breath sounds without rales or wheezing.  ?              Abd:  Soft, diffuse mild to mod tender and slightly bloating? Ow ND, + BS, no organomegaly ?              Neurological: Pt is alert. At baseline orientation, motor grossly intact ?              Skin: Skin is warm. No rashes, no other new lesions, LE edema - none ?              Psychiatric: Pt behavior is normal without agitation  ? ?Micro: none ? ?Cardiac tracings I have personally interpreted today:  none ? ?Pertinent Radiological findings (summarize): none  ? ?Lab Results  ?Component Value Date  ? WBC 16.1 (  H) 02/11/2022  ? HGB 10.2 (L) 02/11/2022  ? HCT 31.9 (L) 02/11/2022  ? PLT 186 02/11/2022  ? GLUCOSE 210 (H) 02/11/2022  ? CHOL 179 09/15/2021  ? TRIG 241 (H) 02/11/2022  ? HDL 21.00 (L) 09/15/2021  ? LDLDIRECT 35.0 09/15/2021  ? [place]  07/08/2020  ?   Comment:  ?   . ?LDL cholesterol not calculated. Triglyceride levels ?greater than 400 mg/dL invalidate calculated LDL results. ?. ?Reference range: <100 ?Marland Kitchen ?Desirable range <100 mg/dL for primary prevention;   ?<70 mg/dL for patients with CHD or  diabetic patients  ?with > or = 2 CHD risk factors. ?. ?LDL-C is now calculated using the Martin-Hopkins  ?calculation, which is a validated novel method providing  ?better accuracy than the Friedewald equation in the  ?estimation of LDL-C.  ?Cresenciano Genre et al. Annamaria Helling. 7026;378(58): 2061-2068  ?(http://education.[employer].com/faq/FAQ164) ?  ? ALT 22 02/11/2022  ? AST 31 02/11/2022  ? NA 134 (L) 02/11/2022  ? K 3.9 02/11/2022  ? CL 98 02/11/2022  ? CREATININE 0.65 02/11/2022  ? BUN 10 02/11/2022  ? CO2 28 02/11/2022  ? TSH 1.83 02/03/2021  ? PSA 2.12 02/03/2021  ? INR 1.1 02/03/2022  ? HGBA1C 8.5 (H) 09/15/2021  ? MICROALBUR <0.7 02/03/2021  ? ?Assessment/Plan:  ?Kevin Miller is a 61 y.o. White or Caucasian [1] male with  has a past medical history of Clostridium difficile infection (2010), Diabetes mellitus, Elevated LFTs (2011), Fatty liver, Hyperlipidemia, Hypertension, Hypogonadism male (2012), Leg fracture, left, Neck pain, Osteopenia (2011), Pancreatic pseudocyst, Pancreatitis (2010), and Vitamin B 12 deficiency. ? ?Acute recurrent pancreatitis ?Second lifetime episode this time due to severe hypertriglyceridemia with poor diet and hx of DM, ok for tramadol qhs prn persistent resolving pain,  to f/u any worsening symptoms or concerns ? ?Diabetes mellitus without complication ([place]) ?Currently less well controlled on 10 units levemir - ok to increase th 16 units, but pt to call In 5 days , and consider further increase for persistent elevated over 120 - 130; declines DM education referral for now ? ?HTN (hypertension) ?Marland Kitchen ?BP Readings from Last 3 Encounters:  ?02/16/22 118/66  ?02/12/22 (!) 144/87  ?09/22/21 138/76  ? ?Stable, pt to continue medical treatment norvasc, losartan, lopressor ? ?Followup: Return in about 5 weeks (around 03/23/2022). ? ?Cathlean Cower, MD 02/17/2022 11:53 AM ?[facility] ?[place] ?Internal Medicine ?

## 2022-02-16 NOTE — Patient Instructions (Addendum)
Ok to increase the levemir to 16 units per day ? ?Please let us know by Mychart on Mon or tues next wk if you are not down at least to about the 150 level ? ?Please continue all other medications as before, and refills have been done if requested - tramadol ? ?Please have the pharmacy call with any other refills you may need. ? ?Please continue your efforts at being more active, low cholesterol diabetes diet, and weight control. ? ?Please keep your appointments with your specialists as you may have planned ? ?We'll see you next week at your regular appointment.  ?

## 2022-02-17 ENCOUNTER — Encounter: Payer: Self-pay | Admitting: Internal Medicine

## 2022-02-17 NOTE — Assessment & Plan Note (Signed)
Second lifetime episode this time due to severe hypertriglyceridemia with poor diet and hx of DM, ok for tramadol qhs prn persistent resolving pain,  to f/u any worsening symptoms or concerns ?

## 2022-02-17 NOTE — Assessment & Plan Note (Signed)
. ?  BP Readings from Last 3 Encounters:  ?02/16/22 118/66  ?02/12/22 (!) 144/87  ?09/22/21 138/76  ? ?Stable, pt to continue medical treatment norvasc, losartan, lopressor ? ?

## 2022-02-17 NOTE — Assessment & Plan Note (Signed)
Currently less well controlled on 10 units levemir - ok to increase th 16 units, but pt to call In 5 days , and consider further increase for persistent elevated over 120 - 130; declines DM education referral for now ?

## 2022-02-20 ENCOUNTER — Encounter: Payer: Self-pay | Admitting: Internal Medicine

## 2022-02-20 ENCOUNTER — Other Ambulatory Visit (INDEPENDENT_AMBULATORY_CARE_PROVIDER_SITE_OTHER): Payer: 59

## 2022-02-20 DIAGNOSIS — E538 Deficiency of other specified B group vitamins: Secondary | ICD-10-CM | POA: Diagnosis not present

## 2022-02-20 DIAGNOSIS — E559 Vitamin D deficiency, unspecified: Secondary | ICD-10-CM | POA: Diagnosis not present

## 2022-02-20 DIAGNOSIS — Z125 Encounter for screening for malignant neoplasm of prostate: Secondary | ICD-10-CM

## 2022-02-20 DIAGNOSIS — Z0000 Encounter for general adult medical examination without abnormal findings: Secondary | ICD-10-CM

## 2022-02-20 DIAGNOSIS — E1165 Type 2 diabetes mellitus with hyperglycemia: Secondary | ICD-10-CM | POA: Diagnosis not present

## 2022-02-20 LAB — URINALYSIS, ROUTINE W REFLEX MICROSCOPIC
Bilirubin Urine: NEGATIVE
Hgb urine dipstick: NEGATIVE
Ketones, ur: NEGATIVE
Leukocytes,Ua: NEGATIVE
Nitrite: NEGATIVE
RBC / HPF: NONE SEEN (ref 0–?)
Specific Gravity, Urine: 1.02 (ref 1.000–1.030)
Total Protein, Urine: NEGATIVE
Urine Glucose: NEGATIVE
Urobilinogen, UA: 1 (ref 0.0–1.0)
pH: 6 (ref 5.0–8.0)

## 2022-02-20 LAB — BASIC METABOLIC PANEL
BUN: 11 mg/dL (ref 6–23)
CO2: 26 mEq/L (ref 19–32)
Calcium: 8.8 mg/dL (ref 8.4–10.5)
Chloride: 98 mEq/L (ref 96–112)
Creatinine, Ser: 0.83 mg/dL (ref 0.40–1.50)
GFR: 95.87 mL/min (ref >60.00)
Glucose, Bld: 185 mg/dL — ABNORMAL HIGH (ref 70–99)
Potassium: 4.4 mEq/L (ref 3.5–5.1)
Sodium: 132 mEq/L — ABNORMAL LOW (ref 135–145)

## 2022-02-20 LAB — MICROALBUMIN / CREATININE URINE RATIO
Creatinine,U: 167 mg/dL
Microalb Creat Ratio: 1 mg/g (ref 0.0–30.0)
Microalb, Ur: 1.7 mg/dL (ref 0.0–1.9)

## 2022-02-20 LAB — CBC WITH DIFFERENTIAL/PLATELET
Basophils Absolute: 0.1 10*3/uL (ref 0.0–0.1)
Basophils Relative: 1.7 % (ref 0.0–3.0)
Eosinophils Absolute: 0.2 10*3/uL (ref 0.0–0.7)
Eosinophils Relative: 3.3 % (ref 0.0–5.0)
HCT: 34.8 % — ABNORMAL LOW (ref 39.0–52.0)
Hemoglobin: 11.5 g/dL — ABNORMAL LOW (ref 13.0–17.0)
Lymphocytes Relative: 25.9 % (ref 12.0–46.0)
Lymphs Abs: 1.3 10*3/uL (ref 0.7–4.0)
MCHC: 33 g/dL (ref 30.0–36.0)
MCV: 82.9 fl (ref 78.0–100.0)
Monocytes Absolute: 0.7 10*3/uL (ref 0.1–1.0)
Monocytes Relative: 14.5 % — ABNORMAL HIGH (ref 3.0–12.0)
Neutro Abs: 2.6 10*3/uL (ref 1.4–7.7)
Neutrophils Relative %: 54.6 % (ref 43.0–77.0)
Platelets: 577 10*3/uL — ABNORMAL HIGH (ref 150.0–400.0)
RBC: 4.2 Mil/uL — ABNORMAL LOW (ref 4.22–5.81)
RDW: 15.7 % — ABNORMAL HIGH (ref 11.5–15.5)
WBC: 4.8 10*3/uL (ref 4.0–10.5)

## 2022-02-20 LAB — HEPATIC FUNCTION PANEL
ALT: 48 U/L (ref 0–53)
AST: 36 U/L (ref 0–37)
Albumin: 3.2 g/dL — ABNORMAL LOW (ref 3.5–5.2)
Alkaline Phosphatase: 124 U/L — ABNORMAL HIGH (ref 39–117)
Bilirubin, Direct: 0.1 mg/dL (ref 0.0–0.3)
Total Bilirubin: 0.5 mg/dL (ref 0.2–1.2)
Total Protein: 6.1 g/dL (ref 6.0–8.3)

## 2022-02-20 LAB — PSA: PSA: 3.39 ng/mL (ref 0.10–4.00)

## 2022-02-20 LAB — LIPID PANEL
Cholesterol: 99 mg/dL (ref 0–200)
HDL: 17.2 mg/dL — ABNORMAL LOW (ref >39.00)
NonHDL: 81.51
Total CHOL/HDL Ratio: 6
Triglycerides: 230 mg/dL — ABNORMAL HIGH (ref 0.0–149.0)
VLDL: 46 mg/dL — ABNORMAL HIGH (ref 0.0–40.0)

## 2022-02-20 LAB — VITAMIN B12: Vitamin B-12: >1504 pg/mL — ABNORMAL HIGH (ref 211–911)

## 2022-02-20 LAB — TSH: TSH: 2.38 u[IU]/mL (ref 0.35–5.50)

## 2022-02-20 LAB — HEMOGLOBIN A1C: Hgb A1c MFr Bld: 9.9 % — ABNORMAL HIGH (ref 4.6–6.5)

## 2022-02-20 LAB — VITAMIN D 25 HYDROXY (VIT D DEFICIENCY, FRACTURES): VITD: 43.99 ng/mL (ref 30.00–100.00)

## 2022-02-20 LAB — LDL CHOLESTEROL, DIRECT: Direct LDL: 55 mg/dL

## 2022-02-22 ENCOUNTER — Encounter: Payer: Self-pay | Admitting: Internal Medicine

## 2022-02-22 ENCOUNTER — Telehealth: Payer: Self-pay | Admitting: Internal Medicine

## 2022-02-22 NOTE — Telephone Encounter (Signed)
PT visits today with forms to be filled out by Dr.John! Forms have been left in mailbox to be filled out! ? ?CB: [phone] ?

## 2022-02-23 ENCOUNTER — Other Ambulatory Visit: Payer: Self-pay | Admitting: Internal Medicine

## 2022-02-23 NOTE — Telephone Encounter (Signed)
Papers completed and patient picked them up ?

## 2022-02-27 ENCOUNTER — Encounter: Payer: Self-pay | Admitting: Internal Medicine

## 2022-02-27 MED ORDER — INSULIN DETEMIR 100 UNIT/ML FLEXPEN: 26.0000 [IU] | PEN_INJECTOR | Freq: Every day | SUBCUTANEOUS | 11 refills | Status: DC

## 2022-03-06 ENCOUNTER — Encounter: Payer: Self-pay | Admitting: Internal Medicine

## 2022-03-06 ENCOUNTER — Other Ambulatory Visit: Payer: Self-pay | Admitting: Student in an Organized Health Care Education/Training Program

## 2022-03-07 ENCOUNTER — Encounter: Payer: Self-pay | Admitting: Internal Medicine

## 2022-03-07 MED ORDER — FENOFIBRATE 160 MG PO TABS: 160.0000 mg | ORAL_TABLET | Freq: Every day | ORAL | 3 refills | Status: DC

## 2022-03-07 MED ORDER — LOSARTAN POTASSIUM 100 MG PO TABS: 100.0000 mg | ORAL_TABLET | Freq: Every day | ORAL | 3 refills | Status: DC

## 2022-03-07 MED ORDER — AMLODIPINE BESYLATE 10 MG PO TABS
10.0000 mg | ORAL_TABLET | Freq: Every day | ORAL | 3 refills | Status: DC
Start: 2022-03-07 — End: 2022-06-04

## 2022-03-08 ENCOUNTER — Other Ambulatory Visit: Payer: Self-pay | Admitting: Student in an Organized Health Care Education/Training Program

## 2022-03-09 ENCOUNTER — Encounter: Payer: Self-pay | Admitting: Internal Medicine

## 2022-03-11 MED ORDER — METOPROLOL TARTRATE 100 MG PO TABS: 100.0000 mg | ORAL_TABLET | Freq: Two times a day (BID) | ORAL | 3 refills | Status: DC

## 2022-03-11 MED ORDER — ATORVASTATIN CALCIUM 80 MG PO TABS: 80.0000 mg | ORAL_TABLET | Freq: Every day | ORAL | 3 refills | Status: DC

## 2022-03-23 ENCOUNTER — Encounter: Payer: Self-pay | Admitting: Internal Medicine

## 2022-03-23 ENCOUNTER — Ambulatory Visit (INDEPENDENT_AMBULATORY_CARE_PROVIDER_SITE_OTHER): Payer: 59 | Admitting: Internal Medicine

## 2022-03-23 VITALS — BP 136/70 | HR 69 | Temp 98.4°F | Ht 72.0 in | Wt 233.0 lb

## 2022-03-23 DIAGNOSIS — D649 Anemia, unspecified: Secondary | ICD-10-CM | POA: Diagnosis not present

## 2022-03-23 DIAGNOSIS — R972 Elevated prostate specific antigen [PSA]: Secondary | ICD-10-CM | POA: Diagnosis not present

## 2022-03-23 DIAGNOSIS — E538 Deficiency of other specified B group vitamins: Secondary | ICD-10-CM | POA: Diagnosis not present

## 2022-03-23 DIAGNOSIS — E559 Vitamin D deficiency, unspecified: Secondary | ICD-10-CM

## 2022-03-23 DIAGNOSIS — Z0001 Encounter for general adult medical examination with abnormal findings: Secondary | ICD-10-CM

## 2022-03-23 DIAGNOSIS — E119 Type 2 diabetes mellitus without complications: Secondary | ICD-10-CM

## 2022-03-23 DIAGNOSIS — I10 Essential (primary) hypertension: Secondary | ICD-10-CM

## 2022-03-23 DIAGNOSIS — E1165 Type 2 diabetes mellitus with hyperglycemia: Secondary | ICD-10-CM

## 2022-03-23 MED ORDER — INSULIN DETEMIR 100 UNIT/ML FLEXPEN: 34.0000 [IU] | PEN_INJECTOR | Freq: Every day | SUBCUTANEOUS | 11 refills | Status: DC

## 2022-03-23 MED ORDER — CYANOCOBALAMIN 1000 MCG/ML IJ SOLN: 1000.0000 ug | INTRAMUSCULAR | 3 refills | Status: DC

## 2022-03-23 NOTE — Progress Notes (Signed)
Patient ID: Kevin Miller, male   DOB: 1961-01-22, 61 y.o.   MRN: [id number] ? ? ? ?     Chief Complaint:: wellness exam and f/u DM, low b12, ? Increasing psa, and improving anemia post pancreatitis hospn ? ?     HPI:  Kevin Miller is a 61 y.o. male here for wellness exam; due for optho appt, a1c and o/w up to date ?         ?              Also Has been very self motivated with diet and wt loss post hosp , some possibly IVF wt post hosp as well?. Denies urinary symptoms such as dysuria, frequency, urgency, flank pain, hematuria or n/v, fever, chills.  Pt denies chest pain, increased sob or doe, wheezing, orthopnea, PND, increased LE swelling, palpitations, dizziness or syncope.   Pt denies polydipsia, polyuria, or new focal neuro s/s.    Pt denies fever, wt loss, night sweats, loss of appetite, or other constitutional symptoms  Has not been taking b12 shots.  CBgs have still been mostly 150   ?Wt Readings from Last 3 Encounters:  ?03/23/22 233 lb (105.7 kg)  ?02/16/22 257 lb (116.6 kg)  ?02/03/22 250 lb (113.4 kg)  ? ?BP Readings from Last 3 Encounters:  ?03/23/22 136/70  ?02/16/22 118/66  ?02/12/22 (!) 144/87  ? ?Immunization History  ?Administered Date(s) Administered  ? Influenza Whole 08/25/2010, 08/17/2011  ? Influenza, Seasonal, Injecte, Preservative Fre 11/17/2012  ? Influenza,inj,Quad PF,6+ Mos 09/16/2014, 08/26/2015, 08/07/2017, 01/15/2020, 09/22/2021  ? Influenza-Unspecified 09/09/2018  ? PFIZER(Purple Top)SARS-COV-2 Vaccination 07/02/2020  ? Pneumococcal Conjugate-13 10/11/2017  ? Pneumococcal Polysaccharide-23 02/10/2016  ? Td 08/26/2015  ? Zoster Recombinat (Shingrix) 01/15/2020, 03/18/2020  ? ?Health Maintenance Due  ?Topic Date Due  ? OPHTHALMOLOGY EXAM  01/10/2020  ? ?  ? ?Past Medical History:  ?Diagnosis Date  ? Clostridium difficile infection 2010  ? Diabetes mellitus   ? Elevated LFTs 2011  ? Fatty liver   ? Hyperlipidemia   ? Hypertension   ? Hypogonadism male 2012  ? Leg fracture, left    ? metal rod  ? Neck pain   ? Left  ? Osteopenia 2011  ? back pain  ? Pancreatic pseudocyst   ? Resolved  ? Pancreatitis 2010  ? ? etiol  ? Vitamin B 12 deficiency   ? ?Past Surgical History:  ?Procedure Laterality Date  ? CHOLECYSTECTOMY    ? ELBOW SURGERY    ? right  ? HERNIA REPAIR    ? LEG SURGERY    ? Left fracture  ? TIBIA FRACTURE SURGERY    ? left  ? ? reports that he has never smoked. He has never used smokeless tobacco. He reports current alcohol use. He reports that he does not use drugs. ?family history includes Alcohol abuse in his father; COPD in his mother; Colon polyps in his father; Peripheral vascular disease in his mother. ?Allergies  ?Allergen Reactions  ? Gemfibrozil   ?  REACTION: abn LFTs  ? ?Current Outpatient Medications on File Prior to Visit  ?Medication Sig Dispense Refill  ? acetaminophen (TYLENOL) 500 MG tablet Take 1,000 mg by mouth every 6 (six) hours as needed for mild pain.    ? amLODipine (NORVASC) 10 MG tablet Take 1 tablet (10 mg total) by mouth daily. 90 tablet 3  ? atorvastatin (LIPITOR) 80 MG tablet Take 1 tablet (80 mg total) by mouth daily at 6 PM.  90 tablet 3  ? fenofibrate 160 MG tablet Take 1 tablet (160 mg total) by mouth daily. 90 tablet 3  ? Glucose Blood (RELION PREMIER TEST VI) by In Vitro route 2 (two) times daily at 10 AM and 5 PM.    ? Insulin Pen Needle (PEN NEEDLES 3/16") 31G X 5 MM MISC 1 each by Does not apply route daily. 100 each 0  ? losartan (COZAAR) 100 MG tablet Take 1 tablet (100 mg total) by mouth daily. 90 tablet 3  ? metoprolol tartrate (LOPRESSOR) 100 MG tablet Take 1 tablet (100 mg total) by mouth 2 (two) times daily. 180 tablet 3  ? traMADol (ULTRAM) 50 MG tablet Take 1 tablet (50 mg total) by mouth every 6 (six) hours as needed. 30 tablet 0  ? ?No current facility-administered medications on file prior to visit.  ? ?     ROS:  All others reviewed and negative. ? ?Objective  ? ?     PE:  BP 136/70 (BP Location: Right Arm, Patient Position:  Sitting, Cuff Size: Large)   Pulse 69   Temp 98.4 ?F (36.9 ?C) (Oral)   Ht 6' (1.829 m)   Wt 233 lb (105.7 kg)   SpO2 96%   BMI 31.60 kg/m?  ? ?              Constitutional: Pt appears in NAD ?              HENT: Head: NCAT.  ?              Right Ear: External ear normal.   ?              Left Ear: External ear normal.  ?              Eyes: . Pupils are equal, round, and reactive to light. Conjunctivae and EOM are normal ?              Nose: without d/c or deformity ?              Neck: Neck supple. Gross normal ROM ?              Cardiovascular: Normal rate and regular rhythm.   ?              Pulmonary/Chest: Effort normal and breath sounds without rales or wheezing.  ?              Abd:  Soft, NT, ND, + BS, no organomegaly ?              Neurological: Pt is alert. At baseline orientation, motor grossly intact ?              Skin: Skin is warm. No rashes, no other new lesions, LE edema - none ?              Psychiatric: Pt behavior is normal without agitation  ? ?Micro: none ? ?Cardiac tracings I have personally interpreted today:  none ? ?Pertinent Radiological findings (summarize): none  ? ?Lab Results  ?Component Value Date  ? WBC 4.8 02/20/2022  ? HGB 11.5 (L) 02/20/2022  ? HCT 34.8 (L) 02/20/2022  ? PLT 577.0 (H) 02/20/2022  ? GLUCOSE 185 (H) 02/20/2022  ? CHOL 99 02/20/2022  ? TRIG 230.0 (H) 02/20/2022  ? HDL 17.20 (L) 02/20/2022  ? LDLDIRECT 55.0 02/20/2022  ? [place]  07/08/2020  ?   Comment:  ?   Marland Kitchen ?  LDL cholesterol not calculated. Triglyceride levels ?greater than 400 mg/dL invalidate calculated LDL results. ?. ?Reference range: <100 ?Marland Kitchen ?Desirable range <100 mg/dL for primary prevention;   ?<70 mg/dL for patients with CHD or diabetic patients  ?with > or = 2 CHD risk factors. ?. ?LDL-C is now calculated using the Martin-Hopkins  ?calculation, which is a validated novel method providing  ?better accuracy than the Friedewald equation in the  ?estimation of LDL-C.  ?Cresenciano Genre et al. Annamaria Helling. 4196;222(97):  2061-2068  ?(http://education.[facility].com/faq/FAQ164) ?  ? ALT 48 02/20/2022  ? AST 36 02/20/2022  ? NA 132 (L) 02/20/2022  ? K 4.4 02/20/2022  ? CL 98 02/20/2022  ? CREATININE 0.83 02/20/2022  ? BUN 11 02/20/2022  ? CO2 26 02/20/2022  ? TSH 2.38 02/20/2022  ? PSA 3.39 02/20/2022  ? INR 1.1 02/03/2022  ? HGBA1C 9.9 (H) 02/20/2022  ? MICROALBUR 1.7 02/20/2022  ? ?Assessment/Plan:  ?Kevin Miller is a 61 y.o. White or Caucasian [1] male with  has a past medical history of Clostridium difficile infection (2010), Diabetes mellitus, Elevated LFTs (2011), Fatty liver, Hyperlipidemia, Hypertension, Hypogonadism male (2012), Leg fracture, left, Neck pain, Osteopenia (2011), Pancreatic pseudocyst, Pancreatitis (2010), and Vitamin B 12 deficiency. ? ?Encounter for well adult exam with abnormal findings ?Age and sex appropriate education and counseling updated with regular exercise and diet ?Referrals for preventative services - for optho referral ?Immunizations addressed - none needed ?Smoking counseling  - none needed ?Evidence for depression or other mood disorder - none significant ?Most recent labs reviewed. ?I have personally reviewed and have noted: ?1) the patient's medical and social history ?2) The patient's current medications and supplements ?3) The patient's height, weight, and BMI have been recorded in the chart ? ? ?B12 deficiency ?[place] for change to every other month B12 IM ? ?Diabetes ([place]) ?Improved but still mild uncontrolled, for increased levemir to 34 units ? ?Hypertension, uncontrolled ?BP Readings from Last 3 Encounters:  ?03/23/22 136/70  ?02/16/22 118/66  ?02/12/22 (!) 144/87  ? ?Stable, pt to continue medical treatment norvasc ? ? ?Vitamin D deficiency ?Last vitamin D ?Lab Results  ?Component Value Date  ? VD25OH 43.99 02/20/2022  ? ?Stable, cont oral replacement ? ? ? ?Increased prostate specific antigen (PSA) velocity ?Lab Results  ?Component Value Date  ? PSA 3.39 02/20/2022  ? PSA 2.12  02/03/2021  ? PSA 1.95 01/15/2020  ? ?For f/u psa in 6 mo ? ?Anemia ?Mild recent, for iron lab f/u ?Followup: Return in about 6 months (around 09/22/2022). ? ?Cathlean Cower, MD 03/24/2022 10:55 PM ?[place]

## 2022-03-23 NOTE — Patient Instructions (Addendum)
You will be contacted regarding the referral for: Dr Groat/ophthalmology ? ?Ok to increase the levemir to 34 units per day ? ?Please continue all other medications as before, and refills have been done if requested. ? ?Please have the pharmacy call with any other refills you may need. ? ?Please continue your efforts at being more active, low cholesterol diet, and weight control. ? ?You are otherwise up to date with prevention measures today. ? ?Please keep your appointments with your specialists as you may have planned ? ?Please make an Appointment to return in 6 months, or sooner if needed, also with Lab Appointment for testing done 3-5 days before at the [facility] (so this is for TWO appointments - please see the scheduling desk as you leave) ? ?Due to the ongoing Covid 19 pandemic, our lab now requires an appointment for any labs done at our office.  If you need labs done and do not have an appointment, please call our office ahead of time to schedule before presenting to the lab for your testing. ? ? ?

## 2022-03-24 ENCOUNTER — Encounter: Payer: Self-pay | Admitting: Internal Medicine

## 2022-03-24 DIAGNOSIS — D649 Anemia, unspecified: Secondary | ICD-10-CM | POA: Insufficient documentation

## 2022-03-24 DIAGNOSIS — R972 Elevated prostate specific antigen [PSA]: Secondary | ICD-10-CM | POA: Insufficient documentation

## 2022-03-24 NOTE — Assessment & Plan Note (Signed)
Last vitamin D ?Lab Results  ?Component Value Date  ? VD25OH 43.99 02/20/2022  ? ?Stable, cont oral replacement ? ? ?

## 2022-03-24 NOTE — Assessment & Plan Note (Signed)
Age and sex appropriate education and counseling updated with regular exercise and diet ?Referrals for preventative services - for optho referral ?Immunizations addressed - none needed ?Smoking counseling  - none needed ?Evidence for depression or other mood disorder - none significant ?Most recent labs reviewed. ?I have personally reviewed and have noted: ?1) the patient's medical and social history ?2) The patient's current medications and supplements ?3) The patient's height, weight, and BMI have been recorded in the chart ? ?

## 2022-03-24 NOTE — Assessment & Plan Note (Signed)
[place] for change to every other month B12 IM ?

## 2022-03-24 NOTE — Assessment & Plan Note (Signed)
Lab Results  ?Component Value Date  ? PSA 3.39 02/20/2022  ? PSA 2.12 02/03/2021  ? PSA 1.95 01/15/2020  ? ?For f/u psa in 6 mo ?

## 2022-03-24 NOTE — Assessment & Plan Note (Signed)
BP Readings from Last 3 Encounters:  ?03/23/22 136/70  ?02/16/22 118/66  ?02/12/22 (!) 144/87  ? ?Stable, pt to continue medical treatment norvasc ? ?

## 2022-03-24 NOTE — Assessment & Plan Note (Signed)
Improved but still mild uncontrolled, for increased levemir to 34 units ?

## 2022-03-24 NOTE — Assessment & Plan Note (Signed)
Mild recent, for iron lab f/u ?

## 2022-03-29 ENCOUNTER — Encounter: Payer: Self-pay | Admitting: Internal Medicine

## 2022-03-29 MED ORDER — "PEN NEEDLES 3/16"" 31G X 5 MM MISC": 1.0000 | Freq: Every day | 3 refills | Status: DC

## 2022-03-29 MED ORDER — INSULIN DETEMIR 100 UNIT/ML FLEXPEN
34.0000 [IU] | PEN_INJECTOR | Freq: Every day | SUBCUTANEOUS | 11 refills | Status: DC
Start: 2022-03-29 — End: 2022-06-29

## 2022-03-30 ENCOUNTER — Encounter: Payer: Self-pay | Admitting: Internal Medicine

## 2022-03-30 MED ORDER — ACCU-CHEK GUIDE VI STRP: 1.0000 | ORAL_STRIP | Freq: Two times a day (BID) | 3 refills | Status: DC

## 2022-03-30 MED ORDER — ACCU-CHEK GUIDE W/DEVICE KIT: PACK | 0 refills | Status: AC

## 2022-03-30 MED ORDER — ACCU-CHEK SOFTCLIX LANCETS MISC: 3 refills | Status: AC

## 2022-04-08 ENCOUNTER — Encounter: Payer: Self-pay | Admitting: Internal Medicine

## 2022-04-25 ENCOUNTER — Other Ambulatory Visit: Payer: Self-pay | Admitting: Internal Medicine

## 2022-05-08 NOTE — Telephone Encounter (Signed)
Please contact pt to assist in rescheduling - he is requesting f/u appt at 3 mo in July 2023 instead of October

## 2022-05-22 LAB — HM DIABETES EYE EXAM

## 2022-06-04 ENCOUNTER — Other Ambulatory Visit: Payer: Self-pay | Admitting: Internal Medicine

## 2022-06-04 MED ORDER — FENOFIBRATE 160 MG PO TABS: 160.0000 mg | ORAL_TABLET | Freq: Every day | ORAL | 3 refills | Status: DC

## 2022-06-04 MED ORDER — METOPROLOL TARTRATE 100 MG PO TABS
100.0000 mg | ORAL_TABLET | Freq: Two times a day (BID) | ORAL | 3 refills | Status: DC
Start: 2022-06-04 — End: 2023-04-19

## 2022-06-04 MED ORDER — ATORVASTATIN CALCIUM 80 MG PO TABS: 80.0000 mg | ORAL_TABLET | Freq: Every day | ORAL | 3 refills | Status: DC

## 2022-06-04 MED ORDER — AMLODIPINE BESYLATE 10 MG PO TABS
10.0000 mg | ORAL_TABLET | Freq: Every day | ORAL | 3 refills | Status: DC
Start: 2022-06-04 — End: 2023-08-02

## 2022-06-19 ENCOUNTER — Other Ambulatory Visit (INDEPENDENT_AMBULATORY_CARE_PROVIDER_SITE_OTHER): Payer: 59

## 2022-06-19 DIAGNOSIS — D649 Anemia, unspecified: Secondary | ICD-10-CM | POA: Diagnosis not present

## 2022-06-19 DIAGNOSIS — E1165 Type 2 diabetes mellitus with hyperglycemia: Secondary | ICD-10-CM

## 2022-06-19 DIAGNOSIS — E538 Deficiency of other specified B group vitamins: Secondary | ICD-10-CM

## 2022-06-19 DIAGNOSIS — E559 Vitamin D deficiency, unspecified: Secondary | ICD-10-CM | POA: Diagnosis not present

## 2022-06-19 DIAGNOSIS — R972 Elevated prostate specific antigen [PSA]: Secondary | ICD-10-CM | POA: Diagnosis not present

## 2022-06-19 LAB — BASIC METABOLIC PANEL
BUN: 28 mg/dL — ABNORMAL HIGH (ref 6–23)
CO2: 25 mEq/L (ref 19–32)
Calcium: 9.1 mg/dL (ref 8.4–10.5)
Chloride: 102 mEq/L (ref 96–112)
Creatinine, Ser: 0.99 mg/dL (ref 0.40–1.50)
GFR: 83.25 mL/min (ref >60.00)
Glucose, Bld: 188 mg/dL — ABNORMAL HIGH (ref 70–99)
Potassium: 4.3 mEq/L (ref 3.5–5.1)
Sodium: 136 mEq/L (ref 135–145)

## 2022-06-19 LAB — PSA: PSA: 1.9 ng/mL (ref 0.10–4.00)

## 2022-06-19 LAB — CBC WITH DIFFERENTIAL/PLATELET
Basophils Absolute: 0.1 10*3/uL (ref 0.0–0.1)
Basophils Relative: 1.2 % (ref 0.0–3.0)
Eosinophils Absolute: 0.2 10*3/uL (ref 0.0–0.7)
Eosinophils Relative: 3 % (ref 0.0–5.0)
HCT: 42 % (ref 39.0–52.0)
Hemoglobin: 14 g/dL (ref 13.0–17.0)
Lymphocytes Relative: 25.5 % (ref 12.0–46.0)
Lymphs Abs: 1.9 10*3/uL (ref 0.7–4.0)
MCHC: 33.4 g/dL (ref 30.0–36.0)
MCV: 82.8 fl (ref 78.0–100.0)
Monocytes Absolute: 0.7 10*3/uL (ref 0.1–1.0)
Monocytes Relative: 9.8 % (ref 3.0–12.0)
Neutro Abs: 4.6 10*3/uL (ref 1.4–7.7)
Neutrophils Relative %: 60.5 % (ref 43.0–77.0)
Platelets: 244 10*3/uL (ref 150.0–400.0)
RBC: 5.08 Mil/uL (ref 4.22–5.81)
RDW: 15.5 % (ref 11.5–15.5)
WBC: 7.5 10*3/uL (ref 4.0–10.5)

## 2022-06-19 LAB — HEPATIC FUNCTION PANEL
ALT: 44 U/L (ref 0–53)
AST: 38 U/L — ABNORMAL HIGH (ref 0–37)
Albumin: 4.3 g/dL (ref 3.5–5.2)
Alkaline Phosphatase: 111 U/L (ref 39–117)
Bilirubin, Direct: 0.1 mg/dL (ref 0.0–0.3)
Total Bilirubin: 0.5 mg/dL (ref 0.2–1.2)
Total Protein: 6.8 g/dL (ref 6.0–8.3)

## 2022-06-19 LAB — LIPID PANEL
Cholesterol: 125 mg/dL (ref 0–200)
HDL: 20.4 mg/dL — ABNORMAL LOW (ref >39.00)
Total CHOL/HDL Ratio: 6
Triglycerides: 522 mg/dL — ABNORMAL HIGH (ref 0.0–149.0)

## 2022-06-19 LAB — VITAMIN B12: Vitamin B-12: 236 pg/mL (ref 211–911)

## 2022-06-19 LAB — LDL CHOLESTEROL, DIRECT: Direct LDL: 62 mg/dL

## 2022-06-19 LAB — IBC PANEL
Iron: 70 ug/dL (ref 42–165)
Saturation Ratios: 14.7 % — ABNORMAL LOW (ref 20.0–50.0)
TIBC: 474.6 ug/dL — ABNORMAL HIGH (ref 250.0–450.0)
Transferrin: 339 mg/dL (ref 212.0–360.0)

## 2022-06-19 LAB — HEMOGLOBIN A1C: Hgb A1c MFr Bld: 8.1 % — ABNORMAL HIGH (ref 4.6–6.5)

## 2022-06-19 LAB — VITAMIN D 25 HYDROXY (VIT D DEFICIENCY, FRACTURES): VITD: 40.01 ng/mL (ref 30.00–100.00)

## 2022-06-29 ENCOUNTER — Ambulatory Visit: Payer: 59 | Admitting: Internal Medicine

## 2022-06-29 ENCOUNTER — Encounter: Payer: Self-pay | Admitting: Internal Medicine

## 2022-06-29 VITALS — BP 128/70 | HR 68 | Temp 98.1°F | Ht 72.0 in | Wt 237.0 lb

## 2022-06-29 DIAGNOSIS — E1165 Type 2 diabetes mellitus with hyperglycemia: Secondary | ICD-10-CM

## 2022-06-29 DIAGNOSIS — Z794 Long term (current) use of insulin: Secondary | ICD-10-CM

## 2022-06-29 DIAGNOSIS — E559 Vitamin D deficiency, unspecified: Secondary | ICD-10-CM

## 2022-06-29 DIAGNOSIS — E119 Type 2 diabetes mellitus without complications: Secondary | ICD-10-CM

## 2022-06-29 DIAGNOSIS — I10 Essential (primary) hypertension: Secondary | ICD-10-CM | POA: Diagnosis not present

## 2022-06-29 DIAGNOSIS — E781 Pure hyperglyceridemia: Secondary | ICD-10-CM | POA: Diagnosis not present

## 2022-06-29 MED ORDER — DAPAGLIFLOZIN PROPANEDIOL 10 MG PO TABS: 10.0000 mg | ORAL_TABLET | Freq: Every day | ORAL | 3 refills | Status: DC

## 2022-06-29 MED ORDER — INSULIN DETEMIR 100 UNIT/ML FLEXPEN: 50.0000 [IU] | PEN_INJECTOR | Freq: Every day | SUBCUTANEOUS | 11 refills | Status: DC

## 2022-06-29 NOTE — Patient Instructions (Signed)
Ok to increase the Levemir to the 50 units per day  Please check to see if the Nelva Nay is covered with your insurance and let us know if it is, so we might consider change to this  Please take all new medication as prescribed - restarting the farxiga 10 mg per day  Please continue all other medications as before, and refills have been done if requested.  Please have the pharmacy call with any other refills you may need.  Please continue your efforts at being more active, low cholesterol diet, and weight control.  Please keep your appointments with your specialists as you may have planned  You will be contacted regarding the referral for: Endocrinology  Ok to keep your Appt in October if you have not seen Endo by then  O/w, Please make an Appointment to return in 6 months, or sooner if needed, also with Lab Appointment for testing done 3-5 days before at the [facility] (so this is for TWO appointments - please see the scheduling desk as you leave)

## 2022-06-29 NOTE — Progress Notes (Unsigned)
Patient ID: Kevin Miller, male   DOB: 01-31-1961, 61 y.o.   MRN: [id number]        Chief Complaint: follow up HTN, HLD, dm       HPI:  Kevin Miller is a 61 y.o. male here with c/o persistent elevated sugars to 200 at frequent times.  Lost wt to 227 after pancreatitis, now increased to 237 again.  Trying to follow diet and activity closely, including lower fat diet.  Pt denies chest pain, increased sob or doe, wheezing, orthopnea, PND, increased LE swelling, palpitations, dizziness or syncope.   Pt denies polydipsia, polyuria, or new focal neuro s/s.    Pt denies fever, wt loss, night sweats, loss of appetite, or other constitutional symptoms       Wt Readings from Last 3 Encounters:  06/29/22 237 lb (107.5 kg)  03/23/22 233 lb (105.7 kg)  02/16/22 257 lb (116.6 kg)   BP Readings from Last 3 Encounters:  06/29/22 128/70  03/23/22 136/70  02/16/22 118/66         Past Medical History:  Diagnosis Date   Clostridium difficile infection 2010   Diabetes mellitus    Elevated LFTs 2011   Fatty liver    Hyperlipidemia    Hypertension    Hypogonadism male 2012   Leg fracture, left    metal rod   Neck pain    Left   Osteopenia 2011   back pain   Pancreatic pseudocyst    Resolved   Pancreatitis 2010   ? etiol   Vitamin B 12 deficiency    Past Surgical History:  Procedure Laterality Date   CHOLECYSTECTOMY     ELBOW SURGERY     right   HERNIA REPAIR     LEG SURGERY     Left fracture   TIBIA FRACTURE SURGERY     left    reports that he has never smoked. He has never used smokeless tobacco. He reports current alcohol use. He reports that he does not use drugs. family history includes Alcohol abuse in his father; COPD in his mother; Colon polyps in his father; Peripheral vascular disease in his mother. Allergies  Allergen Reactions   Gemfibrozil     REACTION: abn LFTs   Current Outpatient Medications on File Prior to Visit  Medication Sig Dispense Refill   Accu-Chek  Softclix Lancets lancets Use as instructed 100 each 3   acetaminophen (TYLENOL) 500 MG tablet Take 1,000 mg by mouth every 6 (six) hours as needed for mild pain.     amLODipine (NORVASC) 10 MG tablet Take 1 tablet (10 mg total) by mouth daily. 90 tablet 3   atorvastatin (LIPITOR) 80 MG tablet Take 1 tablet (80 mg total) by mouth daily at 6 PM. 90 tablet 3   Blood Glucose Monitoring Suppl (ACCU-CHEK GUIDE) w/Device KIT Use to check blood sugars twice a day 1 kit 0   cyanocobalamin (,VITAMIN B-12,) 1000 MCG/ML injection Inject 1 mL (1,000 mcg total) into the muscle every 30 (thirty) days. 3 mL 3   fenofibrate 160 MG tablet Take 1 tablet (160 mg total) by mouth daily. 90 tablet 3   glucose blood (ACCU-CHEK GUIDE) test strip 1 each by Other route 2 (two) times daily. Use to check blood sugars twice a day 100 each 3   Glucose Blood (RELION PREMIER TEST VI) by In Vitro route 2 (two) times daily at 10 AM and 5 PM.     Insulin Pen Needle (PEN NEEDLES  3/16") 31G X 5 MM MISC 1 each by Does not apply route daily. 100 each 3   losartan (COZAAR) 100 MG tablet Take 1 tablet (100 mg total) by mouth daily. 90 tablet 3   metoprolol tartrate (LOPRESSOR) 100 MG tablet Take 1 tablet (100 mg total) by mouth 2 (two) times daily. 180 tablet 3   traMADol (ULTRAM) 50 MG tablet Take 1 tablet (50 mg total) by mouth every 6 (six) hours as needed. 30 tablet 0   No current facility-administered medications on file prior to visit.        ROS:  All others reviewed and negative.  Objective        PE:  BP 128/70 (BP Location: Left Arm, Patient Position: Sitting, Cuff Size: Large)   Pulse 68   Temp 98.1 F (36.7 C) (Oral)   Ht 6' (1.829 m)   Wt 237 lb (107.5 kg)   SpO2 96%   BMI 32.14 kg/m                 Constitutional: Pt appears in NAD               HENT: Head: NCAT.                Right Ear: External ear normal.                 Left Ear: External ear normal.                Eyes: . Pupils are equal, round, and  reactive to light. Conjunctivae and EOM are normal               Nose: without d/c or deformity               Neck: Neck supple. Gross normal ROM               Cardiovascular: Normal rate and regular rhythm.                 Pulmonary/Chest: Effort normal and breath sounds without rales or wheezing.                Abd:  Soft, NT, ND, + BS, no organomegaly               Neurological: Pt is alert. At baseline orientation, motor grossly intact               Skin: Skin is warm. No rashes, no other new lesions, LE edema - none               Psychiatric: Pt behavior is normal without agitation   Micro: none  Cardiac tracings I have personally interpreted today:  none  Pertinent Radiological findings (summarize): none   Lab Results  Component Value Date   WBC 7.5 06/19/2022   HGB 14.0 06/19/2022   HCT 42.0 06/19/2022   PLT 244.0 06/19/2022   GLUCOSE 188 (H) 06/19/2022   CHOL 125 06/19/2022   TRIG (H) 06/19/2022    522.0 Triglyceride is over 400; calculations on Lipids are invalid.   HDL 20.40 (L) 06/19/2022   LDLDIRECT 62.0 06/19/2022   [place]  07/08/2020     Comment:     . LDL cholesterol not calculated. Triglyceride levels greater than 400 mg/dL invalidate calculated LDL results. . Reference range: <100 . Desirable range <100 mg/dL for primary prevention;   <70 mg/dL for patients with CHD or diabetic patients  with >  or = 2 CHD risk factors. Marland Kitchen LDL-C is now calculated using the Martin-Hopkins  calculation, which is a validated novel method providing  better accuracy than the Friedewald equation in the  estimation of LDL-C.  Cresenciano Genre et al. Annamaria Helling. 8875;797(28): 2061-2068  (http://education.[facility].com/faq/FAQ164)    ALT 44 06/19/2022   AST 38 (H) 06/19/2022   NA 136 06/19/2022   K 4.3 06/19/2022   CL 102 06/19/2022   CREATININE 0.99 06/19/2022   BUN 28 (H) 06/19/2022   CO2 25 06/19/2022   TSH 2.38 02/20/2022   PSA 1.90 06/19/2022   INR 1.1 02/03/2022    HGBA1C 8.1 (H) 06/19/2022   MICROALBUR 1.7 02/20/2022   Assessment/Plan:  Kevin Miller is a 61 y.o. White or Caucasian [1] male with  has a past medical history of Clostridium difficile infection (2010), Diabetes mellitus, Elevated LFTs (2011), Fatty liver, Hyperlipidemia, Hypertension, Hypogonadism male (2012), Leg fracture, left, Neck pain, Osteopenia (2011), Pancreatic pseudocyst, Pancreatitis (2010), and Vitamin B 12 deficiency.  Vitamin D deficiency Last vitamin D Lab Results  Component Value Date   VD25OH 40.01 06/19/2022   Stable, cont oral replacement  Diabetes ([place]) Uncontrolled, ok for increased levemir to 50 units qd, add farxiga 10 qd, and consider change insulin to toujeo if ok with insurance, also refer endo, cont DM diet  Hypertriglyceridemia Lab Results  Component Value Date   CHOL 125 06/19/2022   HDL 20.40 (L) 06/19/2022   [place]  07/08/2020     Comment:     . LDL cholesterol not calculated. Triglyceride levels greater than 400 mg/dL invalidate calculated LDL results. . Reference range: <100 . Desirable range <100 mg/dL for primary prevention;   <70 mg/dL for patients with CHD or diabetic patients  with > or = 2 CHD risk factors. Marland Kitchen LDL-C is now calculated using the Martin-Hopkins  calculation, which is a validated novel method providing  better accuracy than the Friedewald equation in the  estimation of LDL-C.  Cresenciano Genre et al. Annamaria Helling. 2060;156(15): 2061-2068  (http://education.[facility].com/faq/FAQ164)    LDLDIRECT 62.0 06/19/2022   TRIG (H) 06/19/2022    522.0 Triglyceride is over 400; calculations on Lipids are invalid.   CHOLHDL 6 06/19/2022   Severe, for better DM control and should improved, for lower fat DM diet, cont fenobrate 160 qd  Hypertension, uncontrolled BP Readings from Last 3 Encounters:  06/29/22 128/70  03/23/22 136/70  02/16/22 118/66   Stable, pt to continue medical treatment amlodipine 10 qd, losartan 100 qd,    Followup: Return in about 6 months (around 12/30/2022).  Cathlean Cower, MD 07/01/2022 3:16 PM [facility]

## 2022-07-01 ENCOUNTER — Encounter: Payer: Self-pay | Admitting: Internal Medicine

## 2022-07-01 NOTE — Assessment & Plan Note (Signed)
Uncontrolled, ok for increased levemir to 50 units qd, add farxiga 10 qd, and consider change insulin to toujeo if ok with insurance, also refer endo, cont DM diet

## 2022-07-01 NOTE — Assessment & Plan Note (Signed)
Last vitamin D Lab Results  Component Value Date   VD25OH 40.01 06/19/2022   Stable, cont oral replacement  

## 2022-07-01 NOTE — Assessment & Plan Note (Signed)
Lab Results  Component Value Date   CHOL 125 06/19/2022   HDL 20.40 (L) 06/19/2022   [place]  07/08/2020     Comment:     . LDL cholesterol not calculated. Triglyceride levels greater than 400 mg/dL invalidate calculated LDL results. . Reference range: <100 . Desirable range <100 mg/dL for primary prevention;   <70 mg/dL for patients with CHD or diabetic patients  with > or = 2 CHD risk factors. Marland Kitchen LDL-C is now calculated using the Martin-Hopkins  calculation, which is a validated novel method providing  better accuracy than the Friedewald equation in the  estimation of LDL-C.  Cresenciano Genre et al. Annamaria Helling. 4373;578(97): 2061-2068  (http://education.[facility].com/faq/FAQ164)    LDLDIRECT 62.0 06/19/2022   TRIG (H) 06/19/2022    522.0 Triglyceride is over 400; calculations on Lipids are invalid.   CHOLHDL 6 06/19/2022   Severe, for better DM control and should improved, for lower fat DM diet, cont fenobrate 160 qd

## 2022-07-01 NOTE — Assessment & Plan Note (Signed)
BP Readings from Last 3 Encounters:  06/29/22 128/70  03/23/22 136/70  02/16/22 118/66   Stable, pt to continue medical treatment amlodipine 10 qd, losartan 100 qd,

## 2022-07-25 ENCOUNTER — Encounter: Payer: Self-pay | Admitting: Internal Medicine

## 2022-08-06 ENCOUNTER — Other Ambulatory Visit: Payer: Self-pay | Admitting: Internal Medicine

## 2022-08-17 ENCOUNTER — Encounter: Payer: Self-pay | Admitting: Internal Medicine

## 2022-08-22 MED ORDER — INSULIN DETEMIR 100 UNIT/ML FLEXPEN: 50.0000 [IU] | PEN_INJECTOR | Freq: Every day | SUBCUTANEOUS | 11 refills | Status: DC

## 2022-08-22 NOTE — Telephone Encounter (Signed)
Patient would like you to update his Rx

## 2022-08-22 NOTE — Addendum Note (Signed)
Addended by: Biagio Borg on: 08/22/2022 05:09 PM   Modules accepted: Orders

## 2022-09-28 ENCOUNTER — Ambulatory Visit: Payer: 59 | Admitting: Internal Medicine

## 2022-10-08 ENCOUNTER — Other Ambulatory Visit: Payer: Self-pay | Admitting: Internal Medicine

## 2022-10-21 ENCOUNTER — Encounter: Payer: Self-pay | Admitting: Internal Medicine

## 2022-10-24 MED ORDER — INSULIN DETEMIR 100 UNIT/ML FLEXPEN: 50.0000 [IU] | PEN_INJECTOR | Freq: Every day | SUBCUTANEOUS | 3 refills | Status: DC

## 2022-10-24 NOTE — Telephone Encounter (Signed)
Patient needing new script for Levimir

## 2022-12-07 ENCOUNTER — Other Ambulatory Visit: Payer: Self-pay | Admitting: Internal Medicine

## 2022-12-28 ENCOUNTER — Other Ambulatory Visit (INDEPENDENT_AMBULATORY_CARE_PROVIDER_SITE_OTHER): Payer: 59

## 2022-12-28 DIAGNOSIS — E119 Type 2 diabetes mellitus without complications: Secondary | ICD-10-CM | POA: Diagnosis not present

## 2022-12-28 LAB — BASIC METABOLIC PANEL
BUN: 21 mg/dL (ref 6–23)
CO2: 19 mEq/L (ref 19–32)
Calcium: 9.1 mg/dL (ref 8.4–10.5)
Chloride: 100 mEq/L (ref 96–112)
Creatinine, Ser: 1.13 mg/dL (ref 0.40–1.50)
GFR: 70.77 mL/min (ref >60.00)
Glucose, Bld: 167 mg/dL — ABNORMAL HIGH (ref 70–99)
Potassium: 4.1 mEq/L (ref 3.5–5.1)
Sodium: 134 mEq/L — ABNORMAL LOW (ref 135–145)

## 2022-12-28 LAB — LDL CHOLESTEROL, DIRECT: Direct LDL: 53 mg/dL

## 2022-12-28 LAB — LIPID PANEL
Cholesterol: 310 mg/dL — ABNORMAL HIGH (ref 0–200)
HDL: 19.5 mg/dL — ABNORMAL LOW (ref >39.00)
Total CHOL/HDL Ratio: 16
Triglycerides: 3100 mg/dL — ABNORMAL HIGH (ref 0.0–149.0)

## 2022-12-28 LAB — HEMOGLOBIN A1C: Hgb A1c MFr Bld: 9.8 % — ABNORMAL HIGH (ref 4.6–6.5)

## 2022-12-28 LAB — HEPATIC FUNCTION PANEL
ALT: 23 U/L (ref 0–53)
AST: 26 U/L (ref 0–37)
Albumin: 4.4 g/dL (ref 3.5–5.2)
Alkaline Phosphatase: 95 U/L (ref 39–117)
Bilirubin, Direct: 0.2 mg/dL (ref 0.0–0.3)
Total Bilirubin: 1 mg/dL (ref 0.2–1.2)
Total Protein: 7.3 g/dL (ref 6.0–8.3)

## 2023-01-04 ENCOUNTER — Ambulatory Visit: Payer: 59 | Admitting: Internal Medicine

## 2023-01-04 ENCOUNTER — Encounter: Payer: Self-pay | Admitting: Internal Medicine

## 2023-01-04 VITALS — BP 130/78 | HR 81 | Temp 98.0°F | Ht 72.0 in | Wt 248.0 lb

## 2023-01-04 DIAGNOSIS — E1165 Type 2 diabetes mellitus with hyperglycemia: Secondary | ICD-10-CM | POA: Diagnosis not present

## 2023-01-04 DIAGNOSIS — E559 Vitamin D deficiency, unspecified: Secondary | ICD-10-CM

## 2023-01-04 DIAGNOSIS — M5416 Radiculopathy, lumbar region: Secondary | ICD-10-CM | POA: Diagnosis not present

## 2023-01-04 DIAGNOSIS — F4321 Adjustment disorder with depressed mood: Secondary | ICD-10-CM | POA: Diagnosis not present

## 2023-01-04 DIAGNOSIS — Z0001 Encounter for general adult medical examination with abnormal findings: Secondary | ICD-10-CM | POA: Diagnosis not present

## 2023-01-04 DIAGNOSIS — E782 Mixed hyperlipidemia: Secondary | ICD-10-CM | POA: Diagnosis not present

## 2023-01-04 DIAGNOSIS — I10 Essential (primary) hypertension: Secondary | ICD-10-CM

## 2023-01-04 DIAGNOSIS — E538 Deficiency of other specified B group vitamins: Secondary | ICD-10-CM

## 2023-01-04 DIAGNOSIS — Z794 Long term (current) use of insulin: Secondary | ICD-10-CM

## 2023-01-04 MED ORDER — INSULIN GLARGINE 100 UNIT/ML SOLOSTAR PEN: 65.0000 [IU] | PEN_INJECTOR | Freq: Every day | SUBCUTANEOUS | 11 refills | Status: DC

## 2023-01-04 NOTE — Progress Notes (Unsigned)
Patient ID: Kevin Miller, male   DOB: 01/02/1961, 62 y.o.   MRN: [id number]         Chief Complaint:: wellness exam and 31mofollow up (Levemir no longer covered through insurance, will have to switch to Lantus instead)  , left lumbar radiculopathy, grief, dm, hld       HPI:  Kevin ALMQUISTis a 62y.o. male here for wellness exam; up to date                        Also mother died ssept 52sarcoma.  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     Also c/o moderate decreased sens to LT to 4th and 5th toes left foot assoc with back pain with radiation to the LLE.  Pt continues to have recurring left LBP without bowel or bladder change, fever, wt loss, gait change or falls.  Pt denies chest pain, increased sob or doe, wheezing, orthopnea, PND, increased LE swelling, palpitations, dizziness or syncope.   Pt denies polydipsia, polyuria, or new focal neuro s/s.    Pt denies fever, wt loss, night sweats, loss of appetite, or other constitutional symptoms    Wt Readings from Last 3 Encounters:  01/04/23 248 lb (112.5 kg)  06/29/22 237 lb (107.5 kg)  03/23/22 233 lb (105.7 kg)   BP Readings from Last 3 Encounters:  01/04/23 130/78  06/29/22 128/70  03/23/22 136/70   Immunization History  Administered Date(s) Administered   Influenza Whole 08/25/2010, 08/17/2011   Influenza, Seasonal, Injecte, Preservative Fre 11/17/2012   Influenza,inj,Quad PF,6+ Mos 09/16/2014, 08/26/2015, 08/07/2017, 01/15/2020, 09/22/2021   Influenza-Unspecified 09/09/2018   PFIZER(Purple Top)SARS-COV-2 Vaccination 07/02/2020   Pneumococcal Conjugate-13 10/11/2017   Pneumococcal Polysaccharide-23 02/10/2016   Td 08/26/2015   Zoster Recombinat (Shingrix) 01/15/2020, 03/18/2020  There are no preventive care reminders to display for this patient.    Past Medical History:  Diagnosis Date   Clostridium difficile infection 2010    Diabetes mellitus    Elevated LFTs 2011   Fatty liver    Hyperlipidemia    Hypertension    Hypogonadism male 2012   Leg fracture, left    metal rod   Neck pain    Left   Osteopenia 2011   back pain   Pancreatic pseudocyst    Resolved   Pancreatitis 2010   ? etiol   Vitamin B 12 deficiency    Past Surgical History:  Procedure Laterality Date   CHOLECYSTECTOMY     ELBOW SURGERY     right   HERNIA REPAIR     LEG SURGERY     Left fracture   TIBIA FRACTURE SURGERY     left    reports that he has never smoked. He has never used smokeless tobacco. He reports current alcohol use. He reports that he does not use drugs. family history includes Alcohol abuse in his father; COPD in his mother; Colon polyps in his father; Peripheral vascular disease in his mother. Allergies  Allergen Reactions   Gemfibrozil     REACTION: abn LFTs   Current Outpatient Medications on File Prior to Visit  Medication Sig Dispense Refill   ACCU-CHEK GUIDE test strip TEST BLOOD SUGAR TWICE A DAY 200 strip 1   Accu-Chek Softclix Lancets lancets Use as instructed 100 each 3   acetaminophen (TYLENOL) 500 MG tablet Take  1,000 mg by mouth every 6 (six) hours as needed for mild pain.     Blood Glucose Monitoring Suppl (ACCU-CHEK GUIDE) w/Device KIT Use to check blood sugars twice a day 1 kit 0   cyanocobalamin (,VITAMIN B-12,) 1000 MCG/ML injection Inject 1 mL (1,000 mcg total) into the muscle every 30 (thirty) days. 3 mL 3   dapagliflozin propanediol (FARXIGA) 10 MG TABS tablet Take 1 tablet (10 mg total) by mouth daily before breakfast. 90 tablet 3   Glucose Blood (RELION PREMIER TEST VI) by In Vitro route 2 (two) times daily at 10 AM and 5 PM.     insulin detemir (LEVEMIR) 100 UNIT/ML FlexPen Inject 50 Units into the skin daily. 45 mL 3   Insulin Pen Needle (PEN NEEDLES 3/16") 31G X 5 MM MISC 1 each by Does not apply route daily. 100 each 3   losartan (COZAAR) 100 MG tablet Take 1 tablet (100 mg total) by  mouth daily. 90 tablet 3   traMADol (ULTRAM) 50 MG tablet Take 1 tablet (50 mg total) by mouth every 6 (six) hours as needed. 30 tablet 0   amLODipine (NORVASC) 10 MG tablet Take 1 tablet (10 mg total) by mouth daily. 90 tablet 3   atorvastatin (LIPITOR) 80 MG tablet Take 1 tablet (80 mg total) by mouth daily at 6 PM. 90 tablet 3   fenofibrate 160 MG tablet Take 1 tablet (160 mg total) by mouth daily. 90 tablet 3   metoprolol tartrate (LOPRESSOR) 100 MG tablet Take 1 tablet (100 mg total) by mouth 2 (two) times daily. 180 tablet 3   No current facility-administered medications on file prior to visit.        ROS:  All others reviewed and negative.  Objective        PE:  BP 130/78 (BP Location: Right Arm, Patient Position: Sitting, Cuff Size: Large)   Pulse 81   Temp 98 F (36.7 C) (Oral)   Ht 6' (1.829 m)   Wt 248 lb (112.5 kg)   SpO2 94%   BMI 33.63 kg/m                 Constitutional: Pt appears in NAD               HENT: Head: NCAT.                Right Ear: External ear normal.                 Left Ear: External ear normal.                Eyes: . Pupils are equal, round, and reactive to light. Conjunctivae and EOM are normal               Nose: without d/c or deformity               Neck: Neck supple. Gross normal ROM               Cardiovascular: Normal rate and regular rhythm.                 Pulmonary/Chest: Effort normal and breath sounds without rales or wheezing.                Abd:  Soft, NT, ND, + BS, no organomegaly               Neurological: Pt is alert. At baseline orientation, motor with 4/5 LLE  motor and decreased sens to left foot lateral aspect               Skin: Skin is warm. No rashes, no other new lesions, LE edema - none               Psychiatric: Pt behavior is normal without agitation , sad depressed affect  Micro: none  Cardiac tracings I have personally interpreted today:  none  Pertinent Radiological findings (summarize): none   Lab Results   Component Value Date   WBC 7.5 06/19/2022   HGB 14.0 06/19/2022   HCT 42.0 06/19/2022   PLT 244.0 06/19/2022   GLUCOSE 167 (H) 12/28/2022   CHOL 310 (H) 12/28/2022   TRIG 3,100.0 (H) 12/28/2022   HDL 19.50 (L) 12/28/2022   LDLDIRECT 53.0 12/28/2022   [place]  07/08/2020     Comment:     . LDL cholesterol not calculated. Triglyceride levels greater than 400 mg/dL invalidate calculated LDL results. . Reference range: <100 . Desirable range <100 mg/dL for primary prevention;   <70 mg/dL for patients with CHD or diabetic patients  with > or = 2 CHD risk factors. Marland Kitchen LDL-C is now calculated using the Martin-Hopkins  calculation, which is a validated novel method providing  better accuracy than the Friedewald equation in the  estimation of LDL-C.  Cresenciano Genre et al. Annamaria Helling. 2633;354(56): 2061-2068  (http://education.[facility].com/faq/FAQ164)    ALT 23 12/28/2022   AST 26 12/28/2022   NA 134 (L) 12/28/2022   K 4.1 12/28/2022   CL 100 12/28/2022   CREATININE 1.13 12/28/2022   BUN 21 12/28/2022   CO2 19 12/28/2022   TSH 2.38 02/20/2022   PSA 1.90 06/19/2022   INR 1.1 02/03/2022   HGBA1C 9.8 (H) 12/28/2022   MICROALBUR 1.7 02/20/2022   Assessment/Plan:  Kevin Miller is a 62 y.o. White or Caucasian [1] male with  has a past medical history of Clostridium difficile infection (2010), Diabetes mellitus, Elevated LFTs (2011), Fatty liver, Hyperlipidemia, Hypertension, Hypogonadism male (2012), Leg fracture, left, Neck pain, Osteopenia (2011), Pancreatic pseudocyst, Pancreatitis (2010), and Vitamin B 12 deficiency.  Encounter for well adult exam with abnormal findings Age and sex appropriate education and counseling updated with regular exercise and diet Referrals for preventative services - none needed Immunizations addressed - none needed Smoking counseling  - none needed Evidence for depression or other mood disorder - none significant Most recent labs reviewed. I have  personally reviewed and have noted: 1) the patient's medical and social history 2) The patient's current medications and supplements 3) The patient's height, weight, and BMI have been recorded in the chart   B12 deficiency Lab Results  Component Value Date   VITAMINB12 236 06/19/2022   Stable, cont replacement  Diabetes ([place]) Lab Results  Component Value Date   HGBA1C 9.8 (H) 12/28/2022   Uncontrolled and now levemir no longer covered by insurance,  pt to change and increase lantus 65 units qd, declines dietary referral, has endo f/u planned April 2024   Hyperlipidemia Lab Results  Component Value Date   CHOL 310 (H) 12/28/2022   HDL 19.50 (L) 12/28/2022   [place]  07/08/2020     Comment:     . LDL cholesterol not calculated. Triglyceride levels greater than 400 mg/dL invalidate calculated LDL results. . Reference range: <100 . Desirable range <100 mg/dL for primary prevention;   <70 mg/dL for patients with CHD or diabetic patients  with > or = 2 CHD risk factors. Marland Kitchen  LDL-C is now calculated using the Martin-Hopkins  calculation, which is a validated novel method providing  better accuracy than the Friedewald equation in the  estimation of LDL-C.  Cresenciano Genre et al. Annamaria Helling. 0037;048(88): 2061-2068  (http://education.[facility].com/faq/FAQ164)    LDLDIRECT 53.0 12/28/2022   TRIG 3,100.0 (H) 12/28/2022   CHOLHDL 16 12/28/2022   Severe worsening uncontrolled triglycerides, pt to continue lipitor 80 mg qd, fenofibrate 160 qd and low fat low chol DM diet , [facility] referral   Hypertension, uncontrolled BP Readings from Last 3 Encounters:  01/04/23 130/78  06/29/22 128/70  03/23/22 136/70   Stable, pt to continue medical treatment losartan 100 mg qd, lopressor 100 bid   Vitamin D deficiency Last vitamin D Lab Results  Component Value Date   VD25OH 40.01 06/19/2022   Stable, cont oral replacement   Left lumbar radiculopathy Recent onset  moderate, for MRI LS spine, refer NS  Grief Declines specific med tx or referral counseling at this time  Followup: Return in about 6 months (around 07/05/2023).  Kevin Cower, MD 01/08/2023 5:12 AM [facility]

## 2023-01-04 NOTE — Patient Instructions (Signed)
Ok to change the levemir to 65 units per day Lantus  Please continue all other medications as before, and refills have been done if requested.  Please have the pharmacy call with any other refills you may need.  Please continue your efforts at being more active, low cholesterol diet, and weight control.  You are otherwise up to date with prevention measures today.  Please keep your appointments with your specialists as you may have planned  You will be contacted regarding the referral for: MRI, and Neurosurgury for your lower back and leg  Please make an Appointment to return in 6 months, or sooner if needed, also with Lab Appointment for testing done 3-5 days before at the [facility] (so this is for TWO appointments - please see the scheduling desk as you leave)

## 2023-01-05 ENCOUNTER — Other Ambulatory Visit: Payer: Self-pay | Admitting: Family Medicine

## 2023-01-05 MED ORDER — INSULIN GLARGINE 100 UNIT/ML SOLOSTAR PEN: 65.0000 [IU] | PEN_INJECTOR | Freq: Every day | SUBCUTANEOUS | 11 refills | Status: DC

## 2023-01-08 ENCOUNTER — Encounter: Payer: Self-pay | Admitting: Internal Medicine

## 2023-01-08 DIAGNOSIS — F4321 Adjustment disorder with depressed mood: Secondary | ICD-10-CM | POA: Insufficient documentation

## 2023-01-08 NOTE — Assessment & Plan Note (Addendum)
Lab Results  Component Value Date   HGBA1C 9.8 (H) 12/28/2022   Uncontrolled and now levemir no longer covered by insurance,  pt to change and increase lantus 65 units qd, declines dietary referral, has endo f/u planned April 2024

## 2023-01-08 NOTE — Assessment & Plan Note (Signed)
Declines specific med tx or referral counseling at this time

## 2023-01-08 NOTE — Assessment & Plan Note (Signed)

## 2023-01-08 NOTE — Assessment & Plan Note (Signed)
Recent onset moderate, for MRI LS spine, refer NS

## 2023-01-08 NOTE — Assessment & Plan Note (Signed)
Lab Results  Component Value Date   VITAMINB12 236 06/19/2022   Stable, cont replacement

## 2023-01-08 NOTE — Assessment & Plan Note (Signed)
Last vitamin D Lab Results  Component Value Date   VD25OH 40.01 06/19/2022   Stable, cont oral replacement

## 2023-01-08 NOTE — Assessment & Plan Note (Signed)
BP Readings from Last 3 Encounters:  01/04/23 130/78  06/29/22 128/70  03/23/22 136/70   Stable, pt to continue medical treatment losartan 100 mg qd, lopressor 100 bid

## 2023-01-08 NOTE — Assessment & Plan Note (Signed)
Lab Results  Component Value Date   CHOL 310 (H) 12/28/2022   HDL 19.50 (L) 12/28/2022   [place]  07/08/2020     Comment:     . LDL cholesterol not calculated. Triglyceride levels greater than 400 mg/dL invalidate calculated LDL results. . Reference range: <100 . Desirable range <100 mg/dL for primary prevention;   <70 mg/dL for patients with CHD or diabetic patients  with > or = 2 CHD risk factors. Marland Kitchen LDL-C is now calculated using the Martin-Hopkins  calculation, which is a validated novel method providing  better accuracy than the Friedewald equation in the  estimation of LDL-C.  Cresenciano Genre et al. Annamaria Helling. 3716;967(89): 2061-2068  (http://education.[facility].com/faq/FAQ164)    LDLDIRECT 53.0 12/28/2022   TRIG 3,100.0 (H) 12/28/2022   CHOLHDL 16 12/28/2022   Severe worsening uncontrolled triglycerides, pt to continue lipitor 80 mg qd, fenofibrate 160 qd and low fat low chol DM diet , [facility] referral

## 2023-01-22 ENCOUNTER — Telehealth: Payer: Self-pay | Admitting: Internal Medicine

## 2023-01-22 NOTE — Telephone Encounter (Signed)
Called [facility] to get the specifics of their request. LOV notes faxed to [phone]

## 2023-01-22 NOTE — Telephone Encounter (Signed)
[facility] called and said they have not received the authorization for patient to receive the imaging. He is scheduled for tomorrow and they said they need the authorization by 2 pm today for him to keep his appointment.

## 2023-01-23 ENCOUNTER — Other Ambulatory Visit: Payer: 59

## 2023-02-24 ENCOUNTER — Other Ambulatory Visit: Payer: Self-pay | Admitting: Internal Medicine

## 2023-02-28 ENCOUNTER — Other Ambulatory Visit: Payer: Self-pay | Admitting: Internal Medicine

## 2023-04-01 ENCOUNTER — Other Ambulatory Visit: Payer: Self-pay | Admitting: Internal Medicine

## 2023-04-05 ENCOUNTER — Encounter: Payer: Self-pay | Admitting: Internal Medicine

## 2023-04-05 ENCOUNTER — Ambulatory Visit: Payer: 59 | Admitting: Internal Medicine

## 2023-04-05 VITALS — BP 132/80 | HR 75 | Ht 72.0 in | Wt 253.0 lb

## 2023-04-05 DIAGNOSIS — Z794 Long term (current) use of insulin: Secondary | ICD-10-CM | POA: Diagnosis not present

## 2023-04-05 DIAGNOSIS — E1165 Type 2 diabetes mellitus with hyperglycemia: Secondary | ICD-10-CM

## 2023-04-05 DIAGNOSIS — E785 Hyperlipidemia, unspecified: Secondary | ICD-10-CM

## 2023-04-05 DIAGNOSIS — Z7984 Long term (current) use of oral hypoglycemic drugs: Secondary | ICD-10-CM | POA: Diagnosis not present

## 2023-04-05 LAB — POCT GLYCOSYLATED HEMOGLOBIN (HGB A1C): Hemoglobin A1C: 7.7 % — AB (ref 4.0–5.6)

## 2023-04-05 MED ORDER — DEXCOM G7 SENSOR MISC: 1.0000 | 3 refills | Status: DC

## 2023-04-05 MED ORDER — GLIPIZIDE 5 MG PO TABS: 5.0000 mg | ORAL_TABLET | Freq: Every day | ORAL | 3 refills | Status: DC

## 2023-04-05 MED ORDER — "PEN NEEDLES 3/16"" 31G X 5 MM MISC": 1.0000 | Freq: Every day | 3 refills | Status: DC

## 2023-04-05 MED ORDER — INSULIN GLARGINE 100 UNIT/ML SOLOSTAR PEN: 60.0000 [IU] | PEN_INJECTOR | Freq: Every day | SUBCUTANEOUS | 4 refills | Status: DC

## 2023-04-05 MED ORDER — FENOFIBRATE 200 MG PO CAPS: 200.0000 mg | ORAL_CAPSULE | Freq: Every day | ORAL | 90 refills | Status: DC

## 2023-04-05 MED ORDER — DAPAGLIFLOZIN PROPANEDIOL 10 MG PO TABS: 10.0000 mg | ORAL_TABLET | Freq: Every day | ORAL | 3 refills | Status: DC

## 2023-04-05 NOTE — Progress Notes (Signed)
Name: Kevin Miller  MRN/ DOB: [id number], January 07, 1961   Age/ Sex: 62 y.o., male    PCP: Corwin Levins, MD   Reason for Endocrinology Evaluation: Type 2 Diabetes Mellitus     Date of Initial Endocrinology Visit: 04/05/2023     PATIENT IDENTIFIER: [name] is a 62 y.o. male with a past medical history of Dm, Pancreatitis . The patient presented for initial endocrinology clinic visit on 04/05/2023 for consultative assistance with his diabetes management.    HPI: Kevin Miller was    Diagnosed with DM 2010 Prior Medications tried/Intolerance: Metformin- no intolerance, Glipizide- no intolerance Currently checking blood sugars 1-2 x / day, Hypoglycemia episodes : no               Hemoglobin A1c has ranged from 5.9% in 2019, peaking at 12.8% in 2010.  In terms of diet, the patient eats 2-3 meals a day , snacks on peanuts. Avoids sugar sweetened beverages    Pt with recurrent Pancreatitis  Denies nausea, constipation or diarrhea  Has left foot numbness that is associated with sciatic pain    HOME DIABETES REGIMEN: Farxiga 10 mg daily  Lantus 65 units daily       Statin: yes ACE-I/ARB: yes   METER DOWNLOAD SUMMARY: Unable to download  125-207 mg/dL   DIABETIC COMPLICATIONS: Microvascular complications:   Denies: CKD Last eye exam: Completed 05/22/2022  Macrovascular complications:   Denies: CAD, PVD, CVA   PAST HISTORY: Past Medical History:  Past Medical History:  Diagnosis Date   Clostridium difficile infection 2010   Diabetes mellitus    Elevated LFTs 2011   Fatty liver    Hyperlipidemia    Hypertension    Hypogonadism male 2012   Leg fracture, left    metal rod   Neck pain    Left   Osteopenia 2011   back pain   Pancreatic pseudocyst    Resolved   Pancreatitis 2010   ? etiol   Vitamin B 12 deficiency    Past Surgical History:  Past Surgical History:  Procedure Laterality Date   CHOLECYSTECTOMY     ELBOW SURGERY     right    HERNIA REPAIR     LEG SURGERY     Left fracture   TIBIA FRACTURE SURGERY     left    Social History:  reports that he has never smoked. He has never used smokeless tobacco. He reports current alcohol use. He reports that he does not use drugs. Family History:  Family History  Problem Relation Age of Onset   COPD Mother    Peripheral vascular disease Mother    Alcohol abuse Father    Colon polyps Father    Colon cancer Neg Hx      HOME MEDICATIONS: Allergies as of 04/05/2023       Reactions   Gemfibrozil    REACTION: abn LFTs        Medication List        Accurate as of April 05, 2023  9:10 AM. If you have any questions, ask your nurse or doctor.          STOP taking these medications    fenofibrate 160 MG tablet Stopped by: Scarlette Shorts, MD   insulin detemir 100 UNIT/ML FlexPen Commonly known as: LEVEMIR Stopped by: Scarlette Shorts, MD   traMADol 50 MG tablet Commonly known as: ULTRAM Stopped by: Scarlette Shorts, MD  TAKE these medications    Accu-Chek Guide w/Device Kit Use to check blood sugars twice a day   Accu-Chek Softclix Lancets lancets Use as instructed   acetaminophen 500 MG tablet Commonly known as: TYLENOL Take 1,000 mg by mouth every 6 (six) hours as needed for mild pain.   amLODipine 10 MG tablet Commonly known as: NORVASC Take 1 tablet (10 mg total) by mouth daily.   atorvastatin 80 MG tablet Commonly known as: LIPITOR Take 1 tablet (80 mg total) by mouth daily at 6 PM.   cyanocobalamin 1000 MCG/ML injection Commonly known as: VITAMIN B12 Inject 1 mL (1,000 mcg total) into the muscle every 30 (thirty) days.   dapagliflozin propanediol 10 MG Tabs tablet Commonly known as: Farxiga Take 1 tablet (10 mg total) by mouth daily before breakfast.   Dexcom G7 Sensor Misc 1 Device by Does not apply route as directed. Started by: Scarlette Shorts, MD   fenofibrate micronized 200 MG capsule Commonly  known as: LOFIBRA Take 1 capsule (200 mg total) by mouth daily before breakfast. Started by: Scarlette Shorts, MD   glipiZIDE 5 MG tablet Commonly known as: GLUCOTROL Take 1 tablet (5 mg total) by mouth daily before breakfast. Started by: Scarlette Shorts, MD   insulin glargine 100 UNIT/ML Solostar Pen Commonly known as: LANTUS Inject 60 Units into the skin daily. What changed: how much to take Changed by: Scarlette Shorts, MD   losartan 100 MG tablet Commonly known as: COZAAR TAKE 1 TABLET BY MOUTH EVERY DAY   metoprolol tartrate 100 MG tablet Commonly known as: LOPRESSOR Take 1 tablet (100 mg total) by mouth 2 (two) times daily.   Pen Needles 3/16" 31G X 5 MM Misc 1 each by Does not apply route daily.   RELION PREMIER TEST VI by In Vitro route 2 (two) times daily at 10 AM and 5 PM.   Accu-Chek Guide test strip Generic drug: glucose blood USE TO TEST BLOOD SUGAR TWICE A DAY         ALLERGIES: Allergies  Allergen Reactions   Gemfibrozil     REACTION: abn LFTs     REVIEW OF SYSTEMS: A comprehensive ROS was conducted with the patient and is negative except as per HPI     OBJECTIVE:   VITAL SIGNS: BP 132/80 (BP Location: Left Arm, Patient Position: Sitting, Cuff Size: Large)   Pulse 75   Ht 6' (1.829 m)   Wt 253 lb (114.8 kg)   SpO2 97%   BMI 34.31 kg/m    PHYSICAL EXAM:  General: Pt appears well and is in NAD  Neck: General: Supple without adenopathy or carotid bruits. Thyroid: Thyroid size normal.  No goiter or nodules appreciated.   Lungs: Clear with good BS bilat with no rales, rhonchi, or wheezes  Heart: RRR   Abdomen:  soft, nontender  Extremities:  Lower extremities - Trace edema   Neuro: MS is good with appropriate affect, pt is alert and Ox3    DM foot exam: per PCP 01/04/2023   DATA REVIEWED:  Lab Results  Component Value Date   HGBA1C 7.7 (A) 04/05/2023   HGBA1C 9.8 (H) 12/28/2022   HGBA1C 8.1 (H) 06/19/2022     Latest Reference Range & Units 12/28/22 08:35  Sodium 135 - 145 mEq/L 134 (L)  Potassium 3.5 - 5.1 mEq/L 4.1  Chloride 96 - 112 mEq/L 100  CO2 19 - 32 mEq/L 19  Glucose 70 - 99 mg/dL 098 (H)  BUN 6 - 23 mg/dL 21  Creatinine 1.61 - 0.96 mg/dL 0.45  Calcium 8.4 - 40.9 mg/dL 9.1  Alkaline Phosphatase 39 - 117 U/L 95  Albumin 3.5 - 5.2 g/dL 4.4  AST 0 - 37 U/L 26  ALT 0 - 53 U/L 23  Total Protein 6.0 - 8.3 g/dL 7.3  Bilirubin, Direct 0.0 - 0.3 mg/dL 0.2  Total Bilirubin 0.2 - 1.2 mg/dL 1.0  GFR >81.19 mL/min 70.77  Total CHOL/HDL Ratio  16  Cholesterol 0 - 200 mg/dL 147 (H)  HDL Cholesterol >39.00 mg/dL 82.95 (L)  Direct LDL mg/dL 62.1  Triglycerides 0.0 - 149.0 mg/dL 3,086.5 (H)    ASSESSMENT / PLAN / RECOMMENDATIONS:   1) Type 2 Diabetes Mellitus, Sub-Optimally controlled, Without complications - Most recent A1c of 7.7 %. Goal A1c < 7.0 %.    Plan: GENERAL: I have discussed with the patient the pathophysiology of diabetes. We went over the natural progression of the disease. We stressed the importance of lifestyle changes. I explained the complications associated with diabetes including retinopathy, nephropathy, neuropathy as well as increased risk of cardiovascular disease. We went over the benefit seen with glycemic control.   I explained to the patient that diabetic patients are at higher than normal risk for amputations.  Pt with hx of recurrent pancreatitis, not a candidate for GLP-1 agonists  He was on glipizide and metformin without reported intolerance in the past We discussed trying glipizide for postprandial hyperglycemia, which he is in agreement of, it is difficult to predict if this will work or not, as it is unknown has endogenous insulin production He was asked to check glucose fasting and before supper at least the next few weeks of starting glipizide A prescription for Dexcom was sent, and a sample sensor was provided, patient was briefly shown how to apply the  sensor    MEDICATIONS: Start glipizide 5 mg, 1 tablet before first meal of the day Continue Farxiga 10 mg daily Decrease Lantus 60 units daily  EDUCATION / INSTRUCTIONS: BG monitoring instructions: Patient is instructed to check his blood sugars 2 times a day, fasting and supper time . Call [place] if: BG persistently < 70  I reviewed the Rule of 15 for the treatment of hypoglycemia in detail with the patient. Literature supplied.   2) Diabetic complications:  Eye: Does not have known diabetic retinopathy.  Neuro/ Feet: Does not have known diabetic peripheral neuropathy. Renal: Patient does not have known baseline CKD. He is  on an ACEI/ARB at present.  3) Dyslipidemia:  -Patient with extremely elevated triglyceride levels, which is the primary reason for pancreatitis -We emphasized the importance of low-fat and low carbohydrate diet, we discussed examples of low carb options -He is already on Lipitor and fenofibrate, I will increase fenofibrate -Will consider add-on therapy if triglycerides remain elevated   Medication  Continue atorvastatin 80 mg daily Increase fenofibrate 200 mg  daily   Follow-up in 3 months   Signed electronically by: Lyndle Herrlich, MD  [facility] End[facility]ology  [facility] [address] [place] [place], [place] [address] Phone: [phone] FAX: [phone]   CC: Corwin Levins, MD [address] [place] [address] Phone: [phone]  Fax: [phone]    Return to Endocrinology clinic as below: Future Appointments  Date Time Provider Department Center  07/05/2023  8:20 AM Corwin Levins, MD [facility] None

## 2023-04-05 NOTE — Patient Instructions (Addendum)
Start Glipizide 5 mg, 1 tablet before the first meal of the day  Continue Fatxiga 10 mg, 1 tablet every morning  Decrease lantus 60 units daily      HOW TO TREAT LOW BLOOD SUGARS (Blood sugar LESS THAN 70 MG/DL) Please follow the RULE OF 15 for the treatment of hypoglycemia treatment (when your (blood sugars are less than 70 mg/dL)   STEP 1: Take 15 grams of carbohydrates when your blood sugar is low, which includes:  3-4 GLUCOSE TABS  OR 3-4 OZ OF JUICE OR REGULAR SODA OR ONE TUBE OF GLUCOSE GEL    STEP 2: RECHECK blood sugar in 15 MINUTES STEP 3: If your blood sugar is still low at the 15 minute recheck --> then, go back to STEP 1 and treat AGAIN with another 15 grams of carbohydrates.

## 2023-04-14 ENCOUNTER — Other Ambulatory Visit: Payer: Self-pay | Admitting: Internal Medicine

## 2023-04-19 ENCOUNTER — Other Ambulatory Visit: Payer: Self-pay

## 2023-04-30 ENCOUNTER — Other Ambulatory Visit: Payer: Self-pay | Admitting: Internal Medicine

## 2023-06-20 ENCOUNTER — Ambulatory Visit: Payer: 59 | Admitting: Internal Medicine

## 2023-06-20 ENCOUNTER — Encounter: Payer: Self-pay | Admitting: Internal Medicine

## 2023-06-20 VITALS — BP 150/85 | HR 78 | Ht 73.0 in | Wt 259.4 lb

## 2023-06-20 DIAGNOSIS — E785 Hyperlipidemia, unspecified: Secondary | ICD-10-CM

## 2023-06-20 DIAGNOSIS — E1165 Type 2 diabetes mellitus with hyperglycemia: Secondary | ICD-10-CM

## 2023-06-20 DIAGNOSIS — Z794 Long term (current) use of insulin: Secondary | ICD-10-CM | POA: Diagnosis not present

## 2023-06-20 LAB — POCT GLYCOSYLATED HEMOGLOBIN (HGB A1C): Hemoglobin A1C: 7.4 % — AB (ref 4.0–5.6)

## 2023-06-20 MED ORDER — GLIPIZIDE 5 MG PO TABS: 5.0000 mg | ORAL_TABLET | Freq: Two times a day (BID) | ORAL | 3 refills | Status: DC

## 2023-06-20 MED ORDER — INSULIN GLARGINE 100 UNIT/ML SOLOSTAR PEN: 56.0000 [IU] | PEN_INJECTOR | Freq: Every day | SUBCUTANEOUS | Status: DC

## 2023-06-20 NOTE — Patient Instructions (Signed)
Increase Glipizide 5 mg, 1 tablet before the first meal of the day and last meal of the day  Continue Fatxiga 10 mg, 1 tablet every morning  Decrease lantus 56 units daily      HOW TO TREAT LOW BLOOD SUGARS (Blood sugar LESS THAN 70 MG/DL) Please follow the RULE OF 15 for the treatment of hypoglycemia treatment (when your (blood sugars are less than 70 mg/dL)   STEP 1: Take 15 grams of carbohydrates when your blood sugar is low, which includes:  3-4 GLUCOSE TABS  OR 3-4 OZ OF JUICE OR REGULAR SODA OR ONE TUBE OF GLUCOSE GEL    STEP 2: RECHECK blood sugar in 15 MINUTES STEP 3: If your blood sugar is still low at the 15 minute recheck --> then, go back to STEP 1 and treat AGAIN with another 15 grams of carbohydrates.

## 2023-06-20 NOTE — Progress Notes (Signed)
Name: Kevin Miller  MRN/ DOB: [id number], 06-08-1961   Age/ Sex: 62 y.o., male    PCP: Corwin Levins, MD   Reason for Endocrinology Evaluation: Type 2 Diabetes Mellitus     Date of Initial Endocrinology Visit: 04/05/2023    PATIENT IDENTIFIER: Kevin Miller is a 62 y.o. male with a past medical history of Dm, Pancreatitis, psoriasis  . The patient presented for initial endocrinology clinic visit on 04/05/2023 for consultative assistance with his diabetes management.    HPI: Kevin Miller was    Diagnosed with DM 2010 Prior Medications tried/Intolerance: Metformin- no intolerance, Glipizide- no intolerance               Hemoglobin A1c has ranged from 5.9% in 2019, peaking at 12.8% in 2010.   Pt with recurrent Pancreatitis   On his initial visit to our clinic he had an A1c 7.7%, he was on Lantus and [place].  I added glipizide   HYPERTRIGLYCERIDEMIA: Patient has been noted with elevated triglycerides as high as 3100 mg/DL in 03/980. He was already on maximum dose of atorvastatin and fenofibrate, I had increased his fenofibrate on the initial visit  SUBJECTIVE:   During the last visit (04/05/2023): A1c 7.7%     Today (06/20/23): Kevin Miller is here for follow-up on diabetes management.  He checks his  blood sugars multiple times daily. The patient has  had hypoglycemic episodes since the last clinic visit but this has been confirmed with normal BG on finger stick      HOME DIABETES REGIMEN: Glipizide 5 mg daily Farxiga 10 mg daily  Lantus 60 units daily  Atorvastatin 80 mg daily Fenofibrate 200 mg daily     Statin: yes ACE-I/ARB: yes  CONTINUOUS GLUCOSE MONITORING RECORD INTERPRETATION    Dates of Recording:  6/28 - 06/20/2023  Sensor description:Dexcom G7  Results statistics:   CGM use % of time 64  Average and SD 159/32  Time in range    75    %  % Time Above 180 24  % Time above 250 <1  % Time Below target 0   Glycemic patterns summary: BG's  are optimal at night with occasional hyperglycemia during the day  Hyperglycemic episodes postprandial  Hypoglycemic episodes occurred at night  Overnight periods: Trends down   DIABETIC COMPLICATIONS: Microvascular complications:   Denies: CKD Last eye exam: Completed 05/22/2022  Macrovascular complications:   Denies: CAD, PVD, CVA   PAST HISTORY: Past Medical History:  Past Medical History:  Diagnosis Date   Clostridium difficile infection 2010   Diabetes mellitus    Elevated LFTs 2011   Fatty liver    Hyperlipidemia    Hypertension    Hypogonadism male 2012   Leg fracture, left    metal rod   Neck pain    Left   Osteopenia 2011   back pain   Pancreatic pseudocyst    Resolved   Pancreatitis 2010   ? etiol   Vitamin B 12 deficiency    Past Surgical History:  Past Surgical History:  Procedure Laterality Date   CHOLECYSTECTOMY     ELBOW SURGERY     right   HERNIA REPAIR     LEG SURGERY     Left fracture   TIBIA FRACTURE SURGERY     left    Social History:  reports that he has never smoked. He has never used smokeless tobacco. He reports current alcohol use. He reports that he does not  use drugs. Family History:  Family History  Problem Relation Age of Onset   COPD Mother    Peripheral vascular disease Mother    Alcohol abuse Father    Colon polyps Father    Colon cancer Neg Hx      HOME MEDICATIONS: Allergies as of 06/20/2023       Reactions   Gemfibrozil    REACTION: abn LFTs        Medication List        Accurate as of June 20, 2023 12:45 PM. If you have any questions, ask your nurse or doctor.          Accu-Chek Guide w/Device Kit Use to check blood sugars twice a day   Accu-Chek Softclix Lancets lancets Use as instructed   acetaminophen 500 MG tablet Commonly known as: TYLENOL Take 1,000 mg by mouth every 6 (six) hours as needed for mild pain.   amLODipine 10 MG tablet Commonly known as: NORVASC Take 1 tablet (10 mg  total) by mouth daily.   atorvastatin 80 MG tablet Commonly known as: LIPITOR TAKE 1 TABLET BY MOUTH DAILY AT 6 PM.   cyanocobalamin 1000 MCG/ML injection Commonly known as: VITAMIN B12 INJECT 1 ML (1,000 MCG TOTAL) INTO THE MUSCLE EVERY 30 DAYS.   dapagliflozin propanediol 10 MG Tabs tablet Commonly known as: Farxiga Take 1 tablet (10 mg total) by mouth daily before breakfast.   Dexcom G7 Sensor Misc 1 Device by Does not apply route as directed.   fenofibrate micronized 200 MG capsule Commonly known as: LOFIBRA Take 1 capsule (200 mg total) by mouth daily before breakfast.   glipiZIDE 5 MG tablet Commonly known as: GLUCOTROL Take 1 tablet (5 mg total) by mouth daily before breakfast.   insulin glargine 100 UNIT/ML Solostar Pen Commonly known as: LANTUS Inject 60 Units into the skin daily.   losartan 100 MG tablet Commonly known as: COZAAR TAKE 1 TABLET BY MOUTH EVERY DAY   metoprolol tartrate 100 MG tablet Commonly known as: LOPRESSOR TAKE 1 TABLET BY MOUTH TWICE A DAY   Pen Needles 3/16" 31G X 5 MM Misc 1 each by Does not apply route daily.   RELION PREMIER TEST VI by In Vitro route 2 (two) times daily at 10 AM and 5 PM.   Accu-Chek Guide test strip Generic drug: glucose blood USE TO TEST BLOOD SUGAR TWICE A DAY         ALLERGIES: Allergies  Allergen Reactions   Gemfibrozil     REACTION: abn LFTs     REVIEW OF SYSTEMS: A comprehensive ROS was conducted with the patient and is negative except as per HPI     OBJECTIVE:   VITAL SIGNS: BP (!) 150/85   Pulse 78   Ht 6\' 1"  (1.854 m)   Wt 259 lb 6.4 oz (117.7 kg)   SpO2 96%   BMI 34.22 kg/m    PHYSICAL EXAM:  General: Pt appears well and is in NAD  Neck: General: Supple without adenopathy or carotid bruits. Thyroid: Thyroid size normal.  No goiter or nodules appreciated.   Lungs: Clear with good BS bilat with no rales, rhonchi, or wheezes  Heart: RRR   Abdomen:  soft, nontender  Extremities:   Lower extremities - Trace edema  Hyperkeratotic macular rash on the shin  Neuro: MS is good with appropriate affect, pt is alert and Ox3    DM foot exam: per PCP 01/04/2023   DATA REVIEWED:  Lab Results  Component Value Date   HGBA1C 7.4 (A) 06/20/2023   HGBA1C 7.7 (A) 04/05/2023   HGBA1C 9.8 (H) 12/28/2022    Latest Reference Range & Units 12/28/22 08:35  Sodium 135 - 145 mEq/L 134 (L)  Potassium 3.5 - 5.1 mEq/L 4.1  Chloride 96 - 112 mEq/L 100  CO2 19 - 32 mEq/L 19  Glucose 70 - 99 mg/dL 161 (H)  BUN 6 - 23 mg/dL 21  Creatinine 0.96 - 0.45 mg/dL 4.09  Calcium 8.4 - 81.1 mg/dL 9.1  Alkaline Phosphatase 39 - 117 U/L 95  Albumin 3.5 - 5.2 g/dL 4.4  AST 0 - 37 U/L 26  ALT 0 - 53 U/L 23  Total Protein 6.0 - 8.3 g/dL 7.3  Bilirubin, Direct 0.0 - 0.3 mg/dL 0.2  Total Bilirubin 0.2 - 1.2 mg/dL 1.0  GFR >91.47 mL/min 70.77  Total CHOL/HDL Ratio  16  Cholesterol 0 - 200 mg/dL 829 (H)  HDL Cholesterol >39.00 mg/dL 56.21 (L)  Direct LDL mg/dL 30.8  Triglycerides 0.0 - 149.0 mg/dL 6,578.4 (H)    ASSESSMENT / PLAN / RECOMMENDATIONS:   1) Type 2 Diabetes Mellitus, Sub-Optimally controlled, Without complications - Most recent A1c of 7.4 %. Goal A1c < 7.0 %.    - Pt with hx of recurrent pancreatitis, not a candidate for GLP-1 agonists  -In reviewing his CGM download, the patient has been noted with postprandial hyperglycemia, he also has been noted with hypoglycemia at night -I will reduce basal insulin as below -I will increase glipizide to twice daily to help with postprandial hyperglycemia -He used to be on metformin without reported intolerance, will consider adding this in the future   MEDICATIONS: Increase glipizide 5 mg, 1 tablet BID Continue Farxiga 10 mg daily Decrease Lantus 56 units daily  EDUCATION / INSTRUCTIONS: BG monitoring instructions: Patient is instructed to check his blood sugars 2 times a day, fasting and supper time . Call [facility]  cl[facility] if: BG persistently < 70  I reviewed the Rule of 15 for the treatment of hypoglycemia in detail with the patient. Literature supplied.   2) Diabetic complications:  Eye: Does not have known diabetic retinopathy.  Neuro/ Feet: Does not have known diabetic peripheral neuropathy. Renal: Patient does not have known baseline CKD. He is  on an ACEI/ARB at present.  3) Dyslipidemia:  -Patient with extremely elevated triglyceride levels > 3000 mg/dL in 05/9628, which is the primary reason for pancreatitis -He is tolerating atorvastatin and increased dose of fenofibrate -Patient has been following low-fat diet  Medication  Continue atorvastatin 80 mg daily Continue fenofibrate 200 mg  daily   Follow-up in 6 months   Signed electronically by: Lyndle Herrlich, MD  [facility]  [facility] [address] [place], [place] [address] Phone: [phone] FAX: [phone]   CC: Corwin Levins, MD [address] [place] [address] Phone: [phone]  Fax: [phone]    Return to Endocrinology clinic as below: Future Appointments  Date Time Provider Department Center  06/20/2023 12:50 PM Brad Lieurance, Konrad Dolores, MD [facility] None  07/05/2023  8:20 AM Corwin Levins, MD [facility] None

## 2023-06-21 ENCOUNTER — Encounter: Payer: Self-pay | Admitting: Internal Medicine

## 2023-06-27 ENCOUNTER — Other Ambulatory Visit: Payer: Self-pay | Admitting: Internal Medicine

## 2023-07-05 ENCOUNTER — Ambulatory Visit: Payer: 59 | Admitting: Internal Medicine

## 2023-07-10 ENCOUNTER — Encounter (INDEPENDENT_AMBULATORY_CARE_PROVIDER_SITE_OTHER): Payer: Self-pay

## 2023-07-24 ENCOUNTER — Other Ambulatory Visit: Payer: 59

## 2023-08-02 ENCOUNTER — Encounter: Payer: Self-pay | Admitting: Internal Medicine

## 2023-08-02 ENCOUNTER — Ambulatory Visit: Payer: 59 | Admitting: Internal Medicine

## 2023-08-02 VITALS — BP 126/82 | HR 75 | Temp 97.7°F | Ht 73.0 in | Wt 257.0 lb

## 2023-08-02 DIAGNOSIS — Z125 Encounter for screening for malignant neoplasm of prostate: Secondary | ICD-10-CM | POA: Diagnosis not present

## 2023-08-02 DIAGNOSIS — G5701 Lesion of sciatic nerve, right lower limb: Secondary | ICD-10-CM | POA: Insufficient documentation

## 2023-08-02 DIAGNOSIS — E559 Vitamin D deficiency, unspecified: Secondary | ICD-10-CM

## 2023-08-02 DIAGNOSIS — E538 Deficiency of other specified B group vitamins: Secondary | ICD-10-CM | POA: Diagnosis not present

## 2023-08-02 DIAGNOSIS — M5416 Radiculopathy, lumbar region: Secondary | ICD-10-CM

## 2023-08-02 DIAGNOSIS — R21 Rash and other nonspecific skin eruption: Secondary | ICD-10-CM

## 2023-08-02 DIAGNOSIS — I10 Essential (primary) hypertension: Secondary | ICD-10-CM | POA: Diagnosis not present

## 2023-08-02 DIAGNOSIS — M47816 Spondylosis without myelopathy or radiculopathy, lumbar region: Secondary | ICD-10-CM | POA: Insufficient documentation

## 2023-08-02 DIAGNOSIS — E782 Mixed hyperlipidemia: Secondary | ICD-10-CM

## 2023-08-02 DIAGNOSIS — E1165 Type 2 diabetes mellitus with hyperglycemia: Secondary | ICD-10-CM

## 2023-08-02 DIAGNOSIS — Z794 Long term (current) use of insulin: Secondary | ICD-10-CM | POA: Diagnosis not present

## 2023-08-02 DIAGNOSIS — E781 Pure hyperglyceridemia: Secondary | ICD-10-CM

## 2023-08-02 LAB — HEPATIC FUNCTION PANEL
ALT: 34 U/L (ref 0–53)
AST: 34 U/L (ref 0–37)
Albumin: 4.4 g/dL (ref 3.5–5.2)
Alkaline Phosphatase: 99 U/L (ref 39–117)
Bilirubin, Direct: 0.2 mg/dL (ref 0.0–0.3)
Total Bilirubin: 0.4 mg/dL (ref 0.2–1.2)
Total Protein: 7.1 g/dL (ref 6.0–8.3)

## 2023-08-02 LAB — CBC WITH DIFFERENTIAL/PLATELET
Basophils Absolute: 0.2 10*3/uL — ABNORMAL HIGH (ref 0.0–0.1)
Basophils Relative: 2.4 % (ref 0.0–3.0)
Eosinophils Absolute: 0.3 10*3/uL (ref 0.0–0.7)
Eosinophils Relative: 5.1 % — ABNORMAL HIGH (ref 0.0–5.0)
HCT: 46.5 % (ref 39.0–52.0)
Hemoglobin: 15.9 g/dL (ref 13.0–17.0)
Lymphocytes Relative: 27.3 % (ref 12.0–46.0)
Lymphs Abs: 1.8 10*3/uL (ref 0.7–4.0)
MCHC: 34.2 g/dL (ref 30.0–36.0)
MCV: 85.8 fl (ref 78.0–100.0)
Monocytes Absolute: 0.7 10*3/uL (ref 0.1–1.0)
Monocytes Relative: 11.1 % (ref 3.0–12.0)
Neutro Abs: 3.5 10*3/uL (ref 1.4–7.7)
Neutrophils Relative %: 54.1 % (ref 43.0–77.0)
Platelets: 249 10*3/uL (ref 150.0–400.0)
RBC: 5.42 Mil/uL (ref 4.22–5.81)
RDW: 14.7 % (ref 11.5–15.5)
WBC: 6.5 10*3/uL (ref 4.0–10.5)

## 2023-08-02 LAB — LIPID PANEL
Cholesterol: 150 mg/dL (ref 0–200)
HDL: 20.2 mg/dL — ABNORMAL LOW (ref >39.00)
Total CHOL/HDL Ratio: 7
Triglycerides: 1086 mg/dL — ABNORMAL HIGH (ref 0.0–149.0)

## 2023-08-02 LAB — BASIC METABOLIC PANEL
BUN: 18 mg/dL (ref 6–23)
CO2: 25 mEq/L (ref 19–32)
Calcium: 9.3 mg/dL (ref 8.4–10.5)
Chloride: 104 mEq/L (ref 96–112)
Creatinine, Ser: 1.07 mg/dL (ref 0.40–1.50)
GFR: 75.25 mL/min (ref >60.00)
Glucose, Bld: 147 mg/dL — ABNORMAL HIGH (ref 70–99)
Potassium: 4.2 mEq/L (ref 3.5–5.1)
Sodium: 138 mEq/L (ref 135–145)

## 2023-08-02 LAB — PSA: PSA: 2.55 ng/mL (ref 0.10–4.00)

## 2023-08-02 LAB — VITAMIN D 25 HYDROXY (VIT D DEFICIENCY, FRACTURES): VITD: 28.22 ng/mL — ABNORMAL LOW (ref 30.00–100.00)

## 2023-08-02 LAB — LDL CHOLESTEROL, DIRECT: Direct LDL: 53 mg/dL

## 2023-08-02 LAB — MICROALBUMIN / CREATININE URINE RATIO
Creatinine,U: 81.5 mg/dL
Microalb Creat Ratio: 0.9 mg/g (ref 0.0–30.0)
Microalb, Ur: <0.7 mg/dL (ref 0.0–1.9)

## 2023-08-02 LAB — HEMOGLOBIN A1C: Hgb A1c MFr Bld: 7.6 % — ABNORMAL HIGH (ref 4.6–6.5)

## 2023-08-02 LAB — VITAMIN B12: Vitamin B-12: 271 pg/mL (ref 211–911)

## 2023-08-02 LAB — TSH: TSH: 1.42 u[IU]/mL (ref 0.35–5.50)

## 2023-08-02 MED ORDER — METOPROLOL TARTRATE 100 MG PO TABS: 100.0000 mg | ORAL_TABLET | Freq: Two times a day (BID) | ORAL | 3 refills | Status: DC

## 2023-08-02 MED ORDER — ATORVASTATIN CALCIUM 80 MG PO TABS: 80.0000 mg | ORAL_TABLET | Freq: Every day | ORAL | 3 refills | Status: DC

## 2023-08-02 MED ORDER — LOSARTAN POTASSIUM 100 MG PO TABS: 100.0000 mg | ORAL_TABLET | Freq: Every day | ORAL | 3 refills | Status: DC

## 2023-08-02 MED ORDER — FENOFIBRATE 200 MG PO CAPS: 200.0000 mg | ORAL_CAPSULE | Freq: Every day | ORAL | 3 refills | Status: DC

## 2023-08-02 MED ORDER — CLOBETASOL PROPIONATE 0.05 % EX CREA: 1.0000 | TOPICAL_CREAM | Freq: Two times a day (BID) | CUTANEOUS | 1 refills | Status: AC

## 2023-08-02 MED ORDER — AMLODIPINE BESYLATE 10 MG PO TABS: 10.0000 mg | ORAL_TABLET | Freq: Every day | ORAL | 3 refills | Status: DC

## 2023-08-02 NOTE — Progress Notes (Unsigned)
Patient ID: Kevin Miller, male   DOB: 1961-02-01, 62 y.o.   MRN: [id number]        Chief Complaint: follow up HTN, HLD and hyperglycemia ***       HPI:  Kevin Miller is a 62 y.o. male here with c/o          Wt Readings from Last 3 Encounters:  08/02/23 257 lb (116.6 kg)  06/20/23 259 lb 6.4 oz (117.7 kg)  04/05/23 253 lb (114.8 kg)   BP Readings from Last 3 Encounters:  08/02/23 126/82  06/20/23 (!) 150/85  04/05/23 132/80         Past Medical History:  Diagnosis Date   Clostridium difficile infection 2010   Diabetes mellitus    Elevated LFTs 2011   Fatty liver    Hyperlipidemia    Hypertension    Hypogonadism male 2012   Leg fracture, left    metal rod   Neck pain    Left   Osteopenia 2011   back pain   Pancreatic pseudocyst    Resolved   Pancreatitis 2010   ? etiol   Vitamin B 12 deficiency    Past Surgical History:  Procedure Laterality Date   CHOLECYSTECTOMY     ELBOW SURGERY     right   HERNIA REPAIR     LEG SURGERY     Left fracture   TIBIA FRACTURE SURGERY     left    reports that he has never smoked. He has never used smokeless tobacco. He reports current alcohol use. He reports that he does not use drugs. family history includes Alcohol abuse in his father; COPD in his mother; Colon polyps in his father; Peripheral vascular disease in his mother. Allergies  Allergen Reactions   Gemfibrozil     REACTION: abn LFTs   Current Outpatient Medications on File Prior to Visit  Medication Sig Dispense Refill   Accu-Chek Softclix Lancets lancets Use as instructed 100 each 3   acetaminophen (TYLENOL) 500 MG tablet Take 1,000 mg by mouth every 6 (six) hours as needed for mild pain.     atorvastatin (LIPITOR) 80 MG tablet TAKE 1 TABLET BY MOUTH DAILY AT 6 PM. 90 tablet 3   Blood Glucose Monitoring Suppl (ACCU-CHEK GUIDE) w/Device KIT Use to check blood sugars twice a day 1 kit 0   Continuous Glucose Sensor (DEXCOM G7 SENSOR) MISC 1 Device by Does  not apply route as directed. 9 each 3   cyanocobalamin (VITAMIN B12) 1000 MCG/ML injection INJECT 1 ML (1,000 MCG TOTAL) INTO THE MUSCLE EVERY 30 DAYS. 3 mL 2   dapagliflozin propanediol (FARXIGA) 10 MG TABS tablet Take 1 tablet (10 mg total) by mouth daily before breakfast. 90 tablet 3   fenofibrate micronized (LOFIBRA) 200 MG capsule Take 1 capsule (200 mg total) by mouth daily before breakfast. 90 capsule 90   glipiZIDE (GLUCOTROL) 5 MG tablet Take 1 tablet (5 mg total) by mouth 2 (two) times daily before a meal. 180 tablet 3   glucose blood (ACCU-CHEK GUIDE) test strip USE TO TEST BLOOD SUGAR TWICE A DAY 200 strip 3   Glucose Blood (RELION PREMIER TEST VI) by In Vitro route 2 (two) times daily at 10 AM and 5 PM.     insulin glargine (LANTUS) 100 UNIT/ML Solostar Pen Inject 56 Units into the skin daily.     Insulin Pen Needle (PEN NEEDLES 3/16") 31G X 5 MM MISC 1 each by Does not apply  route daily. 100 each 3   losartan (COZAAR) 100 MG tablet TAKE 1 TABLET BY MOUTH EVERY DAY 90 tablet 3   metoprolol tartrate (LOPRESSOR) 100 MG tablet TAKE 1 TABLET BY MOUTH TWICE A DAY 180 tablet 3   amLODipine (NORVASC) 10 MG tablet Take 1 tablet (10 mg total) by mouth daily. 90 tablet 3   No current facility-administered medications on file prior to visit.        ROS:  All others reviewed and negative.  Objective        PE:  BP 126/82 (BP Location: Right Arm, Patient Position: Sitting, Cuff Size: Normal)   Pulse 75   Temp 97.7 F (36.5 C) (Oral)   Ht 6\' 1"  (1.854 m)   Wt 257 lb (116.6 kg)   SpO2 98%   BMI 33.91 kg/m                 Constitutional: Pt appears in NAD               HENT: Head: NCAT.                Right Ear: External ear normal.                 Left Ear: External ear normal.                Eyes: . Pupils are equal, round, and reactive to light. Conjunctivae and EOM are normal               Nose: without d/c or deformity               Neck: Neck supple. Gross normal ROM                Cardiovascular: Normal rate and regular rhythm.                 Pulmonary/Chest: Effort normal and breath sounds without rales or wheezing.                Abd:  Soft, NT, ND, + BS, no organomegaly               Neurological: Pt is alert. At baseline orientation, motor grossly intact               Skin: Skin is warm. No rashes, no other new lesions, LE edema - ***               Psychiatric: Pt behavior is normal without agitation   Micro: none  Cardiac tracings I have personally interpreted today:  none  Pertinent Radiological findings (summarize): none   Lab Results  Component Value Date   WBC 7.5 06/19/2022   HGB 14.0 06/19/2022   HCT 42.0 06/19/2022   PLT 244.0 06/19/2022   GLUCOSE 167 (H) 12/28/2022   CHOL 310 (H) 12/28/2022   TRIG 3,100.0 (H) 12/28/2022   HDL 19.50 (L) 12/28/2022   LDLDIRECT 53.0 12/28/2022   LDLCALC  07/08/2020     Comment:     . LDL cholesterol not calculated. Triglyceride levels greater than 400 mg/dL invalidate calculated LDL results. . Reference range: <100 . Desirable range <100 mg/dL for primary prevention;   <70 mg/dL for patients with CHD or diabetic patients  with > or = 2 CHD risk factors. Marland Kitchen LDL-C is now calculated using the Martin-Hopkins  calculation, which is a validated novel method providing  better accuracy than the Friedewald equation in the  estimation of LDL-C.  Horald Pollen et al. Lenox Ahr. 6440;347(42): 2061-2068  (http://education.[facility].com/faq/FAQ164)    ALT 23 12/28/2022   AST 26 12/28/2022   NA 134 (L) 12/28/2022   K 4.1 12/28/2022   CL 100 12/28/2022   CREATININE 1.13 12/28/2022   BUN 21 12/28/2022   CO2 19 12/28/2022   TSH 2.38 02/20/2022   PSA 1.90 06/19/2022   INR 1.1 02/03/2022   HGBA1C 7.4 (A) 06/20/2023   MICROALBUR 1.7 02/20/2022   Assessment/Plan:  Kevin Miller is a 62 y.o. White or Caucasian [1] male with  has a past medical history of Clostridium difficile infection (2010), Diabetes  mellitus, Elevated LFTs (2011), Fatty liver, Hyperlipidemia, Hypertension, Hypogonadism male (2012), Leg fracture, left, Neck pain, Osteopenia (2011), Pancreatic pseudocyst, Pancreatitis (2010), and Vitamin B 12 deficiency.  No problem-specific Assessment & Plan notes found for this encounter.  Followup: No follow-ups on file.  Oliver Barre, MD 08/02/2023 8:55 AM [facility]

## 2023-08-02 NOTE — Patient Instructions (Addendum)
Please take all new medication as prescribed - the steroid cream  Please continue all other medications as before, and refills have been done if requested.  Please have the pharmacy call with any other refills you may need.  Please continue your efforts at being more active, low cholesterol diet, and weight control.  Please keep your appointments with your specialists as you may have planned - endocrinology, and physical therapy so you can eventually get the MRI  Please go to the LAB at the blood drawing area for the tests to be done  You will be contacted by phone if any changes need to be made immediately.  Otherwise, you will receive a letter about your results with an explanation, but please check with MyChart first.  Please remember to sign up for MyChart if you have not done so, as this will be important to you in the future with finding out test results, communicating by private email, and scheduling acute appointments online when needed.  Please make an Appointment to return in 6 months, or sooner if needed, also with Lab Appointment for testing done 3-5 days before at the FIRST FLOOR Lab (so this is for TWO appointments - please see the scheduling desk as you leave)

## 2023-08-03 ENCOUNTER — Encounter: Payer: Self-pay | Admitting: Internal Medicine

## 2023-08-03 DIAGNOSIS — R21 Rash and other nonspecific skin eruption: Secondary | ICD-10-CM | POA: Insufficient documentation

## 2023-08-03 NOTE — Assessment & Plan Note (Signed)
New mild c/w dermatitis - for clobetasol cr prn

## 2023-08-03 NOTE — Assessment & Plan Note (Signed)
Still needs MRI but turned down by insurance, pt was seen per NS , to start PT and may have MRI if not improved

## 2023-08-03 NOTE — Assessment & Plan Note (Signed)
Lab Results  Component Value Date   VITAMINB12 271 08/02/2023   Low, to continue IM replacement - b12 1000 mcg

## 2023-08-03 NOTE — Assessment & Plan Note (Signed)
Last vitamin D Lab Results  Component Value Date   VD25OH 28.22 (L) 08/02/2023   Uncontrolled, low to start oral replacement

## 2023-08-03 NOTE — Assessment & Plan Note (Signed)
BP Readings from Last 3 Encounters:  08/02/23 126/82  06/20/23 (!) 150/85  04/05/23 132/80   Stable, pt to continue medical treatment norvasc 10 qd

## 2023-08-03 NOTE — Assessment & Plan Note (Signed)
Lab Results  Component Value Date   HGBA1C 7.6 (H) 08/02/2023   Uncontrolled, , pt to continue current medical treatment farxiga 10 mg, glipizide 5 mg bid, lantus 56 u with good DM diet , decliens other change for now, to f/u endo as planned

## 2023-08-03 NOTE — Assessment & Plan Note (Addendum)
Lab Results  Component Value Date   CHOL 150 08/02/2023   HDL 20.20 (L) 08/02/2023   LDLCALC  07/08/2020     Comment:     . LDL cholesterol not calculated. Triglyceride levels greater than 400 mg/dL invalidate calculated LDL results. . Reference range: <100 . Desirable range <100 mg/dL for primary prevention;   <70 mg/dL for patients with CHD or diabetic patients  with > or = 2 CHD risk factors. Marland Kitchen LDL-C is now calculated using the Martin-Hopkins  calculation, which is a validated novel method providing  better accuracy than the Friedewald equation in the  estimation of LDL-C.  Horald Pollen et al. Lenox Ahr. 4332;951(88): 2061-2068  (http://education.[facility].com/faq/FAQ164)    LDLDIRECT 53.0 08/02/2023   TRIG (H) 08/02/2023    1086.0 Triglyceride is over 400; calculations on Lipids are invalid.   CHOLHDL 7 08/02/2023   Controlled LDL, continue lipitor 80 every day, continue fenofibrate, for card CT score

## 2023-08-03 NOTE — Assessment & Plan Note (Signed)
Improved, for fenofibrate as above

## 2023-12-27 ENCOUNTER — Ambulatory Visit: Payer: 59 | Admitting: Internal Medicine

## 2024-01-01 ENCOUNTER — Other Ambulatory Visit: Payer: Self-pay | Admitting: Internal Medicine

## 2024-01-02 ENCOUNTER — Encounter: Payer: Self-pay | Admitting: Internal Medicine

## 2024-01-02 DIAGNOSIS — E538 Deficiency of other specified B group vitamins: Secondary | ICD-10-CM

## 2024-01-02 DIAGNOSIS — Z9189 Other specified personal risk factors, not elsewhere classified: Secondary | ICD-10-CM

## 2024-01-02 DIAGNOSIS — Z125 Encounter for screening for malignant neoplasm of prostate: Secondary | ICD-10-CM

## 2024-01-02 DIAGNOSIS — Z794 Long term (current) use of insulin: Secondary | ICD-10-CM

## 2024-01-02 DIAGNOSIS — E782 Mixed hyperlipidemia: Secondary | ICD-10-CM

## 2024-01-02 DIAGNOSIS — E291 Testicular hypofunction: Secondary | ICD-10-CM

## 2024-01-02 DIAGNOSIS — E559 Vitamin D deficiency, unspecified: Secondary | ICD-10-CM

## 2024-01-17 ENCOUNTER — Other Ambulatory Visit: Payer: Self-pay | Admitting: Family Medicine

## 2024-02-07 ENCOUNTER — Ambulatory Visit: Payer: Self-pay | Admitting: Internal Medicine

## 2024-02-11 ENCOUNTER — Other Ambulatory Visit: Payer: Self-pay | Admitting: Internal Medicine

## 2024-02-28 ENCOUNTER — Encounter: Payer: Self-pay | Admitting: Internal Medicine

## 2024-02-28 ENCOUNTER — Ambulatory Visit: Payer: 59 | Admitting: Internal Medicine

## 2024-02-28 ENCOUNTER — Other Ambulatory Visit (INDEPENDENT_AMBULATORY_CARE_PROVIDER_SITE_OTHER)

## 2024-02-28 VITALS — BP 124/78 | HR 72 | Ht 73.0 in | Wt 255.0 lb

## 2024-02-28 DIAGNOSIS — E291 Testicular hypofunction: Secondary | ICD-10-CM | POA: Diagnosis not present

## 2024-02-28 DIAGNOSIS — E1165 Type 2 diabetes mellitus with hyperglycemia: Secondary | ICD-10-CM

## 2024-02-28 DIAGNOSIS — Z125 Encounter for screening for malignant neoplasm of prostate: Secondary | ICD-10-CM | POA: Diagnosis not present

## 2024-02-28 DIAGNOSIS — Z794 Long term (current) use of insulin: Secondary | ICD-10-CM

## 2024-02-28 DIAGNOSIS — E538 Deficiency of other specified B group vitamins: Secondary | ICD-10-CM | POA: Diagnosis not present

## 2024-02-28 DIAGNOSIS — Z9189 Other specified personal risk factors, not elsewhere classified: Secondary | ICD-10-CM

## 2024-02-28 DIAGNOSIS — E1142 Type 2 diabetes mellitus with diabetic polyneuropathy: Secondary | ICD-10-CM | POA: Diagnosis not present

## 2024-02-28 DIAGNOSIS — E785 Hyperlipidemia, unspecified: Secondary | ICD-10-CM

## 2024-02-28 DIAGNOSIS — E559 Vitamin D deficiency, unspecified: Secondary | ICD-10-CM

## 2024-02-28 LAB — CBC WITH DIFFERENTIAL/PLATELET
Basophils Absolute: 0.1 10*3/uL (ref 0.0–0.1)
Basophils Relative: 1.5 % (ref 0.0–3.0)
Eosinophils Absolute: 0.2 10*3/uL (ref 0.0–0.7)
Eosinophils Relative: 4.3 % (ref 0.0–5.0)
HCT: 46.9 % (ref 39.0–52.0)
Hemoglobin: 15.9 g/dL (ref 13.0–17.0)
Lymphocytes Relative: 23.8 % (ref 12.0–46.0)
Lymphs Abs: 1.3 10*3/uL (ref 0.7–4.0)
MCHC: 33.9 g/dL (ref 30.0–36.0)
MCV: 85.6 fl (ref 78.0–100.0)
Monocytes Absolute: 0.5 10*3/uL (ref 0.1–1.0)
Monocytes Relative: 9 % (ref 3.0–12.0)
Neutro Abs: 3.5 10*3/uL (ref 1.4–7.7)
Neutrophils Relative %: 61.4 % (ref 43.0–77.0)
Platelets: 217 10*3/uL (ref 150.0–400.0)
RBC: 5.48 Mil/uL (ref 4.22–5.81)
RDW: 14.8 % (ref 11.5–15.5)
WBC: 5.6 10*3/uL (ref 4.0–10.5)

## 2024-02-28 LAB — MICROALBUMIN / CREATININE URINE RATIO
Creatinine,U: 72.6 mg/dL
Microalb Creat Ratio: UNDETERMINED mg/g (ref 0.0–30.0)
Microalb, Ur: <0.7 mg/dL

## 2024-02-28 LAB — URINALYSIS, ROUTINE W REFLEX MICROSCOPIC
Bilirubin Urine: NEGATIVE
Hgb urine dipstick: NEGATIVE
Ketones, ur: NEGATIVE
Leukocytes,Ua: NEGATIVE
Nitrite: NEGATIVE
RBC / HPF: NONE SEEN (ref 0–?)
Specific Gravity, Urine: 1.02 (ref 1.000–1.030)
Total Protein, Urine: NEGATIVE
Urine Glucose: >=1000 — AB
Urobilinogen, UA: 0.2 (ref 0.0–1.0)
pH: 6 (ref 5.0–8.0)

## 2024-02-28 LAB — POCT GLYCOSYLATED HEMOGLOBIN (HGB A1C): Hemoglobin A1C: 8.1 % — AB (ref 4.0–5.6)

## 2024-02-28 LAB — VITAMIN B12: Vitamin B-12: 502 pg/mL (ref 211–911)

## 2024-02-28 LAB — VITAMIN D 25 HYDROXY (VIT D DEFICIENCY, FRACTURES): VITD: 20.17 ng/mL — ABNORMAL LOW (ref 30.00–100.00)

## 2024-02-28 LAB — TSH: TSH: 1.6 u[IU]/mL (ref 0.35–5.50)

## 2024-02-28 LAB — HEMOGLOBIN A1C: Hgb A1c MFr Bld: 8.4 % — ABNORMAL HIGH (ref 4.6–6.5)

## 2024-02-28 LAB — PSA: PSA: 3.56 ng/mL (ref 0.10–4.00)

## 2024-02-28 LAB — POCT GLUCOSE (DEVICE FOR HOME USE): Glucose Fasting, POC: 183 mg/dL — AB (ref 70–99)

## 2024-02-28 LAB — TESTOSTERONE: Testosterone: 219.25 ng/dL — ABNORMAL LOW (ref 300.00–890.00)

## 2024-02-28 MED ORDER — GLIPIZIDE 10 MG PO TABS: 10.0000 mg | ORAL_TABLET | Freq: Two times a day (BID) | ORAL | 3 refills | Status: AC

## 2024-02-28 MED ORDER — BD PEN NEEDLE MINI U/F 31G X 5 MM MISC: 1.0000 | Freq: Every day | 3 refills | Status: DC

## 2024-02-28 MED ORDER — ATORVASTATIN CALCIUM 80 MG PO TABS: 80.0000 mg | ORAL_TABLET | Freq: Every day | ORAL | 3 refills | Status: DC

## 2024-02-28 MED ORDER — DEXCOM G7 SENSOR MISC: 1.0000 | 3 refills | Status: AC

## 2024-02-28 MED ORDER — FENOFIBRATE 200 MG PO CAPS: 200.0000 mg | ORAL_CAPSULE | Freq: Every day | ORAL | 3 refills | Status: AC

## 2024-02-28 MED ORDER — LANTUS SOLOSTAR 100 UNIT/ML ~~LOC~~ SOPN: 64.0000 [IU] | PEN_INJECTOR | Freq: Every day | SUBCUTANEOUS | 6 refills | Status: AC

## 2024-02-28 MED ORDER — DAPAGLIFLOZIN PROPANEDIOL 10 MG PO TABS: 10.0000 mg | ORAL_TABLET | Freq: Every day | ORAL | 3 refills | Status: AC

## 2024-02-28 NOTE — Patient Instructions (Addendum)
 Increase Glipizide 10 mg, 1 tablet before the first meal of the day and last meal of the day  Continue Fatxiga 10 mg, 1 tablet every morning  Increase lantus 64 units daily      HOW TO TREAT LOW BLOOD SUGARS (Blood sugar LESS THAN 70 MG/DL) Please follow the RULE OF 15 for the treatment of hypoglycemia treatment (when your (blood sugars are less than 70 mg/dL)   STEP 1: Take 15 grams of carbohydrates when your blood sugar is low, which includes:  3-4 GLUCOSE TABS  OR 3-4 OZ OF JUICE OR REGULAR SODA OR ONE TUBE OF GLUCOSE GEL    STEP 2: RECHECK blood sugar in 15 MINUTES STEP 3: If your blood sugar is still low at the 15 minute recheck --> then, go back to STEP 1 and treat AGAIN with another 15 grams of carbohydrates.

## 2024-02-28 NOTE — Progress Notes (Signed)
 Name: Kevin Miller  MRN/ DOB: [id number], 1961/08/19   Age/ Sex: 63 y.o., male    PCP: Corwin Levins, MD   Reason for Endocrinology Evaluation: Type 2 Diabetes Mellitus     Date of Initial Endocrinology Visit: 04/05/2023    PATIENT IDENTIFIER: Kevin Miller is a 63 y.o. male with a past medical history of Dm, Pancreatitis, psoriasis  . The patient presented for initial endocrinology clinic visit on 04/05/2023 for consultative assistance with his diabetes management.    HPI: Kevin Miller was    Diagnosed with DM 2010 Prior Medications tried/Intolerance: Metformin- no intolerance, Glipizide- no intolerance               Hemoglobin A1c has ranged from 5.9% in 2019, peaking at 12.8% in 2010.   Pt with recurrent Pancreatitis   On his initial visit to our clinic he had an A1c 7.7%, he was on Lantus and [place].  I added glipizide   HYPERTRIGLYCERIDEMIA: Patient has been noted with elevated triglycerides as high as 3100 mg/DL in 04/7321. He was already on maximum dose of atorvastatin and fenofibrate, I had increased his fenofibrate on the initial visit  SUBJECTIVE:   During the last visit (06/20/2023): A1c 7.4%     Today (02/28/24): Kevin Miller is here for follow-up on diabetes management.  He has not used the Dexcom in a month, as he did not think he had refills?  Denies nausea or vomiting  Denies constipation or diarrhea  Saw Dermatology for worsening Eczema 2 months ago, he has noted hypoglycemia with using topical treatment Patient has chronic tingling of the left ring and pinky toes, he was seen by neurology last year, this was attributed to spinal origin   HOME DIABETES REGIMEN: Glipizide 5 mg BID Farxiga 10 mg daily  Lantus 56 units daily - takes 60 units Atorvastatin 80 mg daily Fenofibrate 200 mg daily     Statin: yes ACE-I/ARB: yes  CONTINUOUS GLUCOSE MONITORING RECORD INTERPRETATION : N/A  Sensor description:Dexcom G7     DIABETIC  COMPLICATIONS: Microvascular complications:   Denies: CKD Last eye exam: Completed 05/22/2022  Macrovascular complications:   Denies: CAD, PVD, CVA   PAST HISTORY: Past Medical History:  Past Medical History:  Diagnosis Date   Clostridium difficile infection 2010   Diabetes mellitus    Elevated LFTs 2011   Fatty liver    Hyperlipidemia    Hypertension    Hypogonadism male 2012   Leg fracture, left    metal rod   Neck pain    Left   Osteopenia 2011   back pain   Pancreatic pseudocyst    Resolved   Pancreatitis 2010   ? etiol   Vitamin B 12 deficiency    Past Surgical History:  Past Surgical History:  Procedure Laterality Date   CHOLECYSTECTOMY     ELBOW SURGERY     right   HERNIA REPAIR     LEG SURGERY     Left fracture   TIBIA FRACTURE SURGERY     left    Social History:  reports that he has never smoked. He has never used smokeless tobacco. He reports current alcohol use. He reports that he does not use drugs. Family History:  Family History  Problem Relation Age of Onset   COPD Mother    Peripheral vascular disease Mother    Alcohol abuse Father    Colon polyps Father    Colon cancer Neg Hx  HOME MEDICATIONS: Allergies as of 02/28/2024       Reactions   Gemfibrozil    REACTION: abn LFTs        Medication List        Accurate as of February 28, 2024  8:58 AM. If you have any questions, ask your nurse or doctor.          Accu-Chek Guide w/Device Kit Use to check blood sugars twice a day   Accu-Chek Softclix Lancets lancets Use as instructed   acetaminophen 500 MG tablet Commonly known as: TYLENOL Take 1,000 mg by mouth every 6 (six) hours as needed for mild pain.   amLODipine 10 MG tablet Commonly known as: NORVASC Take 1 tablet (10 mg total) by mouth daily.   atorvastatin 80 MG tablet Commonly known as: LIPITOR Take 1 tablet (80 mg total) by mouth daily.   clobetasol cream 0.05 % Commonly known as: TEMOVATE Apply 1  Application topically 2 (two) times daily.   cyanocobalamin 1000 MCG/ML injection Commonly known as: VITAMIN B12 INJECT 1 ML (1,000 MCG TOTAL) INTO THE MUSCLE EVERY 30 DAYS.   dapagliflozin propanediol 10 MG Tabs tablet Commonly known as: Farxiga Take 1 tablet (10 mg total) by mouth daily before breakfast.   Dexcom G7 Sensor Misc 1 Device by Does not apply route as directed.   doxycycline 100 MG capsule Commonly known as: VIBRAMYCIN Take 100 mg by mouth 2 (two) times daily.   fenofibrate micronized 200 MG capsule Commonly known as: LOFIBRA Take 1 capsule (200 mg total) by mouth daily before breakfast.   glipiZIDE 10 MG tablet Commonly known as: GLUCOTROL Take 1 tablet (10 mg total) by mouth 2 (two) times daily before a meal. What changed:  medication strength how much to take Changed by: Johnney Ou Kristien Salatino   Lantus SoloStar 100 UNIT/ML Solostar Pen Generic drug: insulin glargine Inject 64 Units into the skin daily. What changed: See the new instructions. Changed by: Johnney Ou Rhonda Vangieson   losartan 100 MG tablet Commonly known as: COZAAR Take 1 tablet (100 mg total) by mouth daily.   metoprolol tartrate 100 MG tablet Commonly known as: LOPRESSOR Take 1 tablet (100 mg total) by mouth 2 (two) times daily.   Pen Needles 3/16" 31G X 5 MM Misc 1 each by Does not apply route daily.   RELION PREMIER TEST VI by In Vitro route 2 (two) times daily at 10 AM and 5 PM.   Accu-Chek Guide test strip Generic drug: glucose blood USE TO TEST BLOOD SUGAR TWICE A DAY   triamcinolone ointment 0.1 % Commonly known as: KENALOG Apply topically 2 (two) times daily.         ALLERGIES: Allergies  Allergen Reactions   Gemfibrozil     REACTION: abn LFTs     REVIEW OF SYSTEMS: A comprehensive ROS was conducted with the patient and is negative except as per HPI     OBJECTIVE:   VITAL SIGNS: BP 124/78 (BP Location: Left Arm, Patient Position: Sitting, Cuff Size: Normal)    Pulse 72   Ht 6\' 1"  (1.854 m)   Wt 255 lb (115.7 kg)   SpO2 98%   BMI 33.64 kg/m    PHYSICAL EXAM:  General: Pt appears well and is in NAD  Neck: General: Supple without adenopathy or carotid bruits. Thyroid: Thyroid size normal.  No goiter or nodules appreciated.   Lungs: Clear with good BS bilat with no rales, rhonchi, or wheezes  Heart: RRR   Abdomen:  soft, nontender  Extremities:  Lower extremities - Trace edema  Hyperkeratotic macular rash on the shin  Neuro: MS is good with appropriate affect, pt is alert and Ox3    DM foot exam: 02/28/2024  The skin of the feet is intact without sores or ulcerations. The pedal pulses are 2+ on right and 2+ on left. The sensation is decreased to a screening 5.07, 10 gram monofilament on the left    DATA REVIEWED:  Lab Results  Component Value Date   HGBA1C 8.1 (A) 02/28/2024   HGBA1C 7.6 (H) 08/02/2023   HGBA1C 7.4 (A) 06/20/2023    Latest Reference Range & Units 08/02/23 09:24  Sodium 135 - 145 mEq/L 138  Potassium 3.5 - 5.1 mEq/L 4.2  Chloride 96 - 112 mEq/L 104  CO2 19 - 32 mEq/L 25  Glucose 70 - 99 mg/dL 841 (H)  BUN 6 - 23 mg/dL 18  Creatinine 3.24 - 4.01 mg/dL 0.27  Calcium 8.4 - 25.3 mg/dL 9.3  Alkaline Phosphatase 39 - 117 U/L 99  Albumin 3.5 - 5.2 g/dL 4.4  AST 0 - 37 U/L 34  ALT 0 - 53 U/L 34  Total Protein 6.0 - 8.3 g/dL 7.1  Bilirubin, Direct 0.0 - 0.3 mg/dL 0.2  Total Bilirubin 0.2 - 1.2 mg/dL 0.4  GFR >66.44 mL/min 75.25    Latest Reference Range & Units 08/02/23 09:24  Total CHOL/HDL Ratio  7  Cholesterol 0 - 200 mg/dL 034  HDL Cholesterol >74.25 mg/dL 95.63 (L)  Direct LDL mg/dL 87.5  MICROALB/CREAT RATIO 0.0 - 30.0 mg/g 0.9  Triglycerides 0.0 - 149.0 mg/dL 6433.2 Triglyceride is over 400; calculations on Lipids are invalid. (H)    Latest Reference Range & Units 08/02/23 09:24  Creatinine,U mg/dL 95.1  Microalb, Ur 0.0 - 1.9 mg/dL <8.8  MICROALB/CREAT RATIO 0.0 - 30.0 mg/g 0.9   In Office BG  183 mg/dL    ASSESSMENT / PLAN / RECOMMENDATIONS:   1) Type 2 Diabetes Mellitus, Poorly  controlled, With Neuropathic  complications - Most recent A1c of 8.1 %. Goal A1c < 7.0 %.    - Pt with hx of recurrent pancreatitis, not a candidate for GLP-1 agonists nor DPP 4 inhibitors -Patient continues with worsening glycemic control -Will increase glipizide and insulin as below    MEDICATIONS: Increase glipizide 10 mg, 1 tablet BID Continue Farxiga 10 mg daily Increase Lantus 64 units daily  EDUCATION / INSTRUCTIONS: BG monitoring instructions: Patient is instructed to check his blood sugars 2 times a day, fasting and supper time . Call [facility] if: BG persistently < 70  I reviewed the Rule of 15 for the treatment of hypoglycemia in detail with the patient. Literature supplied.   2) Diabetic complications:  Eye: Does not have known diabetic retinopathy.  Neuro/ Feet: Does not have known diabetic peripheral neuropathy. Renal: Patient does not have known baseline CKD. He is  on an ACEI/ARB at present.  3) Dyslipidemia:  - -Patient with extremely elevated triglyceride levels > 3000 mg/dL in 03/1659, which is the primary reason for pancreatitis -He is tolerating atorvastatin and fenofibrate -I have again encouraged him to follow a strict low-fat diet -He will have labs rechecked through his PCPs office -I have recommended adding Vascepa should his triglyceride remain elevated  Medication  Continue atorvastatin 80 mg daily Continue fenofibrate 200 mg  daily   Follow-up in 3 months   Signed electronically by: Lyndle Herrlich, MD  [facility] Endocrinology  [facility] [address] [place], [place] [address] Phone: [phone] FAX: [phone]   CC: Corwin Levins, MD [address] [place] [address] Phone: [phone]  Fax: [phone]    Return to Endocrinology clinic as below: Future Appointments  Date  Time Provider Department Center  03/10/2024  8:00 AM Corwin Levins, MD [facility] None

## 2024-02-29 LAB — BASIC METABOLIC PANEL
BUN: 16 mg/dL (ref 7–25)
CO2: 17 mmol/L — ABNORMAL LOW (ref 20–32)
Calcium: 9.1 mg/dL (ref 8.6–10.3)
Chloride: 105 mmol/L (ref 98–110)
Creat: 1.04 mg/dL (ref 0.70–1.35)
Glucose, Bld: 143 mg/dL — ABNORMAL HIGH (ref 65–99)
Potassium: 4.4 mmol/L (ref 3.5–5.3)
Sodium: 139 mmol/L (ref 135–146)
eGFR: 82 mL/min/{1.73_m2} (ref >=60)

## 2024-02-29 LAB — HEPATIC FUNCTION PANEL
AG Ratio: 1.4 (calc) (ref 1.0–2.5)
ALT: 28 U/L (ref 9–46)
AST: 30 U/L (ref 10–35)
Albumin: 4.2 g/dL (ref 3.6–5.1)
Alkaline phosphatase (APISO): 80 U/L (ref 35–144)
Bilirubin, Direct: 0.1 mg/dL (ref 0.0–0.2)
Globulin: 3.1 g/dL (ref 1.9–3.7)
Indirect Bilirubin: 0.3 mg/dL (ref 0.2–1.2)
Total Bilirubin: 0.4 mg/dL (ref 0.2–1.2)
Total Protein: 7.3 g/dL (ref 6.1–8.1)

## 2024-02-29 LAB — LIPID PANEL
Cholesterol: 185 mg/dL (ref <200)
HDL: 21 mg/dL — ABNORMAL LOW (ref >=40)
Non-HDL Cholesterol (Calc): 164 mg/dL — ABNORMAL HIGH (ref <130)
Total CHOL/HDL Ratio: 8.8 (calc) — ABNORMAL HIGH (ref <5.0)
Triglycerides: 1374 mg/dL — ABNORMAL HIGH (ref <150)

## 2024-03-05 ENCOUNTER — Encounter: Payer: Self-pay | Admitting: Internal Medicine

## 2024-03-05 LAB — TICKBORNE DISEASE ANTIBODY PROFILE, SERUM
Babesia microti IgG: <1:10 {titer}
Lyme Total Antibody EIA: NEGATIVE

## 2024-03-05 NOTE — Progress Notes (Signed)
 The test results show that your current treatment is OK, as the tests are stable.  Please continue the same plan.  There is no other need for change of treatment or further evaluation based on these results, at this time.  thanks

## 2024-03-10 ENCOUNTER — Encounter: Payer: Self-pay | Admitting: Internal Medicine

## 2024-03-10 ENCOUNTER — Ambulatory Visit: Payer: Self-pay | Admitting: Internal Medicine

## 2024-03-10 VITALS — BP 126/82 | HR 71 | Temp 98.0°F | Ht 73.0 in | Wt 257.0 lb

## 2024-03-10 DIAGNOSIS — E781 Pure hyperglyceridemia: Secondary | ICD-10-CM

## 2024-03-10 DIAGNOSIS — Z0000 Encounter for general adult medical examination without abnormal findings: Secondary | ICD-10-CM

## 2024-03-10 DIAGNOSIS — I10 Essential (primary) hypertension: Secondary | ICD-10-CM

## 2024-03-10 DIAGNOSIS — E559 Vitamin D deficiency, unspecified: Secondary | ICD-10-CM | POA: Diagnosis not present

## 2024-03-10 DIAGNOSIS — E1165 Type 2 diabetes mellitus with hyperglycemia: Secondary | ICD-10-CM

## 2024-03-10 DIAGNOSIS — E538 Deficiency of other specified B group vitamins: Secondary | ICD-10-CM

## 2024-03-10 DIAGNOSIS — L309 Dermatitis, unspecified: Secondary | ICD-10-CM

## 2024-03-10 DIAGNOSIS — E782 Mixed hyperlipidemia: Secondary | ICD-10-CM

## 2024-03-10 DIAGNOSIS — Z0001 Encounter for general adult medical examination with abnormal findings: Secondary | ICD-10-CM

## 2024-03-10 DIAGNOSIS — Z794 Long term (current) use of insulin: Secondary | ICD-10-CM

## 2024-03-10 MED ORDER — ICOSAPENT ETHYL 1 G PO CAPS: 2.0000 g | ORAL_CAPSULE | Freq: Two times a day (BID) | ORAL | 3 refills | Status: DC

## 2024-03-10 MED ORDER — VITAMIN D (ERGOCALCIFEROL) 1.25 MG (50000 UNIT) PO CAPS: 50000.0000 [IU] | ORAL_CAPSULE | ORAL | 1 refills | Status: AC

## 2024-03-10 MED ORDER — ZORYVE 0.3 % EX CREA: TOPICAL_CREAM | CUTANEOUS | 5 refills | Status: AC

## 2024-03-10 NOTE — Patient Instructions (Signed)
 Please take all new medication as prescribed - the vascepa, zoryve cream, and Vit D  Please continue all other medications as before, and refills have been done if requested.  Please have the pharmacy call with any other refills you may need.  Please continue your efforts at being more active, low cholesterol diet, and weight control.  You are otherwise up to date with prevention measures today.  Please keep your appointments with your specialists as you may have planned - Endocrinology  You will be contacted regarding the referral for: Dr Dione Booze  Please make an Appointment to return in 6 months, or sooner if needed, also with Lab Appointment for testing done 3-5 days before at the FIRST FLOOR Lab (so this is for TWO appointments - please see the scheduling desk as you leave)

## 2024-03-10 NOTE — Progress Notes (Signed)
 Patient ID: BANDY HONAKER, male   DOB: 16-Feb-1961, 63 y.o.   MRN: [id number]         Chief Complaint:: wellness exam and bilateral sciatica, dm, eczema, hypertriglyceridemia, low vit d       HPI:  SEQUAN AUXIER is a 63 y.o. male here for wellness exam; for eye exam referral, up to date                        Also has been intolerant possibly GI side effect with lopid, does not want to try again, fenofibrate 200 mg not working now.  Willing for [employer].  Pt denies chest pain, increased sob or doe, wheezing, orthopnea, PND, increased LE swelling, palpitations, dizziness or syncope.   Pt denies polydipsia, polyuria, or new focal neuro s/s.    Pt denies fever, wt loss, night sweats, loss of appetite, or other constitutional symptoms  Has bilateral sciatica left > right, mild intermittent ongoing.  Has worsening eczema rash to extremities.   .    Wt Readings from Last 3 Encounters:  03/10/24 257 lb (116.6 kg)  02/28/24 255 lb (115.7 kg)  08/02/23 257 lb (116.6 kg)   BP Readings from Last 3 Encounters:  03/10/24 126/82  02/28/24 124/78  08/02/23 126/82   Immunization History  Administered Date(s) Administered   Influenza Whole 08/25/2010, 08/17/2011   Influenza, Seasonal, Injecte, Preservative Fre 11/17/2012   Influenza,inj,Quad PF,6+ Mos 09/16/2014, 08/26/2015, 08/07/2017, 01/15/2020, 09/22/2021   Influenza-Unspecified 09/09/2018   PFIZER(Purple Top)SARS-COV-2 Vaccination 07/02/2020   Pneumococcal Conjugate-13 10/11/2017   Pneumococcal Polysaccharide-23 02/10/2016   Td 08/26/2015   Zoster Recombinant(Shingrix) 01/15/2020, 03/18/2020   Health Maintenance Due  Topic Date Due   OPHTHALMOLOGY EXAM  11/27/2023      Past Medical History:  Diagnosis Date   Clostridium difficile infection 2010   Diabetes mellitus    Elevated LFTs 2011   Fatty liver    Hyperlipidemia    Hypertension    Hypogonadism male 2012   Leg fracture, left    metal rod   Neck pain    Left    Osteopenia 2011   back pain   Pancreatic pseudocyst    Resolved   Pancreatitis 2010   ? etiol   Vitamin B 12 deficiency    Past Surgical History:  Procedure Laterality Date   CHOLECYSTECTOMY     ELBOW SURGERY     right   HERNIA REPAIR     LEG SURGERY     Left fracture   TIBIA FRACTURE SURGERY     left    reports that he has never smoked. He has never used smokeless tobacco. He reports current alcohol use. He reports that he does not use drugs. family history includes Alcohol abuse in his father; COPD in his mother; Colon polyps in his father; Peripheral vascular disease in his mother. Allergies  Allergen Reactions   Gemfibrozil     REACTION: abn LFTs   Current Outpatient Medications on File Prior to Visit  Medication Sig Dispense Refill   Accu-Chek Softclix Lancets lancets Use as instructed 100 each 3   acetaminophen (TYLENOL) 500 MG tablet Take 1,000 mg by mouth every 6 (six) hours as needed for mild pain.     atorvastatin (LIPITOR) 80 MG tablet Take 1 tablet (80 mg total) by mouth daily. 90 tablet 3   Blood Glucose Monitoring Suppl (ACCU-CHEK GUIDE) w/Device KIT Use to check blood sugars twice a day 1 kit 0  clobetasol cream (TEMOVATE) 0.05 % Apply 1 Application topically 2 (two) times daily. 30 g 1   Continuous Glucose Sensor (DEXCOM G7 SENSOR) MISC 1 Device by Does not apply route as directed. 9 each 3   cyanocobalamin (VITAMIN B12) 1000 MCG/ML injection INJECT 1 ML (1,000 MCG TOTAL) INTO THE MUSCLE EVERY 30 DAYS. 3 mL 2   dapagliflozin propanediol (FARXIGA) 10 MG TABS tablet Take 1 tablet (10 mg total) by mouth daily before breakfast. 90 tablet 3   fenofibrate micronized (LOFIBRA) 200 MG capsule Take 1 capsule (200 mg total) by mouth daily before breakfast. 90 capsule 3   glipiZIDE (GLUCOTROL) 10 MG tablet Take 1 tablet (10 mg total) by mouth 2 (two) times daily before a meal. 180 tablet 3   glucose blood (ACCU-CHEK GUIDE) test strip USE TO TEST BLOOD SUGAR TWICE A DAY 200  strip 3   Glucose Blood (RELION PREMIER TEST VI) by In Vitro route 2 (two) times daily at 10 AM and 5 PM.     insulin glargine (LANTUS SOLOSTAR) 100 UNIT/ML Solostar Pen Inject 64 Units into the skin daily. 60 mL 6   Insulin Pen Needle (B-D UF III MINI PEN NEEDLES) 31G X 5 MM MISC 1 Device by Does not apply route daily in the afternoon. 100 each 3   losartan (COZAAR) 100 MG tablet Take 1 tablet (100 mg total) by mouth daily. 90 tablet 3   metoprolol tartrate (LOPRESSOR) 100 MG tablet Take 1 tablet (100 mg total) by mouth 2 (two) times daily. 180 tablet 3   triamcinolone ointment (KENALOG) 0.1 % Apply topically 2 (two) times daily.     amLODipine (NORVASC) 10 MG tablet Take 1 tablet (10 mg total) by mouth daily. 90 tablet 3   No current facility-administered medications on file prior to visit.        ROS:  All others reviewed and negative.  Objective        PE:  BP 126/82 (BP Location: Right Arm, Patient Position: Sitting, Cuff Size: Normal)   Pulse 71   Temp 98 F (36.7 C) (Oral)   Ht 6\' 1"  (1.854 m)   Wt 257 lb (116.6 kg)   SpO2 93%   BMI 33.91 kg/m                 Constitutional: Pt appears in NAD               HENT: Head: NCAT.                Right Ear: External ear normal.                 Left Ear: External ear normal.                Eyes: . Pupils are equal, round, and reactive to light. Conjunctivae and EOM are normal               Nose: without d/c or deformity               Neck: Neck supple. Gross normal ROM               Cardiovascular: Normal rate and regular rhythm.                 Pulmonary/Chest: Effort normal and breath sounds without rales or wheezing.                Abd:  Soft, NT, ND, + BS, no  organomegaly               Neurological: Pt is alert. At baseline orientation, motor grossly intact               Skin: Skin is warm. No rashes, no other new lesions, LE edema - none               Psychiatric: Pt behavior is normal without agitation   Micro:  none  Cardiac tracings I have personally interpreted today:  none  Pertinent Radiological findings (summarize): none   Lab Results  Component Value Date   WBC 5.6 02/28/2024   HGB 15.9 02/28/2024   HCT 46.9 02/28/2024   PLT 217.0 02/28/2024   GLUCOSE 143 (H) 02/28/2024   CHOL 185 02/28/2024   TRIG 1,374 (H) 02/28/2024   HDL 21 (L) 02/28/2024   LDLDIRECT 53.0 08/02/2023   LDLCALC  02/28/2024     Comment:     . LDL cholesterol not calculated. Triglyceride levels greater than 400 mg/dL invalidate calculated LDL results. . Reference range: <100 . Desirable range <100 mg/dL for primary prevention;   <70 mg/dL for patients with CHD or diabetic patients  with > or = 2 CHD risk factors. Marland Kitchen LDL-C is now calculated using the Martin-Hopkins  calculation, which is a validated novel method providing  better accuracy than the Friedewald equation in the  estimation of LDL-C.  Horald Pollen et al. Lenox Ahr. 1610;960(45): 2061-2068  (http://education.[facility].com/faq/FAQ164)    ALT 28 02/28/2024   AST 30 02/28/2024   NA 139 02/28/2024   K 4.4 02/28/2024   CL 105 02/28/2024   CREATININE 1.04 02/28/2024   BUN 16 02/28/2024   CO2 17 (L) 02/28/2024   TSH 1.60 02/28/2024   PSA 3.56 02/28/2024   INR 1.1 02/03/2022   HGBA1C 8.4 (H) 02/28/2024   MICROALBUR <0.7 02/28/2024   Assessment/Plan:  CARLITO BOGERT is a 63 y.o. White or Caucasian [1] male with  has a past medical history of Clostridium difficile infection (2010), Diabetes mellitus, Elevated LFTs (2011), Fatty liver, Hyperlipidemia, Hypertension, Hypogonadism male (2012), Leg fracture, left, Neck pain, Osteopenia (2011), Pancreatic pseudocyst, Pancreatitis (2010), and Vitamin B 12 deficiency.  Encounter for well adult exam with abnormal findings Age and sex appropriate education and counseling updated with regular exercise and diet Referrals for preventative services - for optho referral with dm Immunizations addressed - none  needed Smoking counseling  - none needed Evidence for depression or other mood disorder - none significant Most recent labs reviewed. I have personally reviewed and have noted: 1) the patient's medical and social history 2) The patient's current medications and supplements 3) The patient's height, weight, and BMI have been recorded in the chart   Diabetes [facility]) Lab Results  Component Value Date   HGBA1C 8.4 (H) 02/28/2024   Stable, pt to continue current medical treatment farxiga 10 every day, glucotrol xl 10 bid, lantus 64 u every day  - no change for now, pt prefers f/u with endo   B12 deficiency Lab Results  Component Value Date   VITAMINB12 502 02/28/2024   Stable, cont oral replacement - b12 1000 mcg qd   Hyperlipidemia Lab Results  Component Value Date   [facility]  02/28/2024     Comment:     . LDL cholesterol not calculated. Triglyceride levels greater than 400 mg/dL invalidate calculated LDL results. . Reference range: <100 . Desirable range <100 mg/dL for primary prevention;   <70 mg/dL for patients with  CHD or diabetic patients  with > or = 2 CHD risk factors. Marland Kitchen LDL-C is now calculated using the Martin-Hopkins  calculation, which is a validated novel method providing  better accuracy than the Friedewald equation in the  estimation of LDL-C.  Horald Pollen et al. Lenox Ahr. 1610;960(45): 2061-2068  (http://education.[facility].com/faq/FAQ164)    Stable, pt to continue current statin lipitor 80 qd   Hypertriglyceridemia Severe, for add vascepa 2 tab bid  Hypertension, uncontrolled BP Readings from Last 3 Encounters:  03/10/24 126/82  02/28/24 124/78  08/02/23 126/82   Stable, pt to continue medical treatment losartan 100 every day, lopressor 100 bid, amlodipine 10 qd   Vitamin D deficiency Last vitamin D Lab Results  Component Value Date   VD25OH 20.17 (L) 02/28/2024   Low, to start oral replacement   Eczema Mild to mod, for start Zoryve  asd,  to f/u any worsening symptoms or concern  Followup: Return in about 6 months (around 09/09/2024).  Oliver Barre, MD 03/14/2024 5:35 PM [facility] Internal Medicine

## 2024-03-14 ENCOUNTER — Encounter: Payer: Self-pay | Admitting: Internal Medicine

## 2024-03-14 DIAGNOSIS — L309 Dermatitis, unspecified: Secondary | ICD-10-CM | POA: Insufficient documentation

## 2024-03-14 NOTE — Assessment & Plan Note (Signed)
 BP Readings from Last 3 Encounters:  03/10/24 126/82  02/28/24 124/78  08/02/23 126/82   Stable, pt to continue medical treatment losartan 100 every day, lopressor 100 bid, amlodipine 10 qd

## 2024-03-14 NOTE — Assessment & Plan Note (Signed)
 Severe, for add vascepa 2 tab bid

## 2024-03-14 NOTE — Assessment & Plan Note (Signed)
 Mild to mod, for start Zoryve asd,  to f/u any worsening symptoms or concern

## 2024-03-14 NOTE — Assessment & Plan Note (Signed)
 Lab Results  Component Value Date   [facility]  02/28/2024     Comment:     . LDL cholesterol not calculated. Triglyceride levels greater than 400 mg/dL invalidate calculated LDL results. . Reference range: <100 . Desirable range <100 mg/dL for primary prevention;   <70 mg/dL for patients with CHD or diabetic patients  with > or = 2 CHD risk factors. Marland Kitchen LDL-C is now calculated using the Martin-Hopkins  calculation, which is a validated novel method providing  better accuracy than the Friedewald equation in the  estimation of LDL-C.  Horald Pollen et al. Lenox Ahr. 3086;578(46): 2061-2068  (http://education.[facility].com/faq/FAQ164)    Stable, pt to continue current statin lipitor 80 qd

## 2024-03-14 NOTE — Assessment & Plan Note (Addendum)
 Age and sex appropriate education and counseling updated with regular exercise and diet Referrals for preventative services - for optho referral with dm Immunizations addressed - none needed Smoking counseling  - none needed Evidence for depression or other mood disorder - none significant Most recent labs reviewed. I have personally reviewed and have noted: 1) the patient's medical and social history 2) The patient's current medications and supplements 3) The patient's height, weight, and BMI have been recorded in the chart

## 2024-03-14 NOTE — Assessment & Plan Note (Signed)
 Last vitamin D Lab Results  Component Value Date   VD25OH 20.17 (L) 02/28/2024   Low, to start oral replacement

## 2024-03-14 NOTE — Assessment & Plan Note (Signed)
 Lab Results  Component Value Date   VITAMINB12 502 02/28/2024   Stable, cont oral replacement - b12 1000 mcg qd

## 2024-03-14 NOTE — Assessment & Plan Note (Signed)
 Lab Results  Component Value Date   HGBA1C 8.4 (H) 02/28/2024   Stable, pt to continue current medical treatment farxiga 10 every day, glucotrol xl 10 bid, lantus 64 u every day  - no change for now, pt prefers f/u with endo

## 2024-04-20 ENCOUNTER — Encounter: Payer: Self-pay | Admitting: *Deleted

## 2024-06-19 ENCOUNTER — Ambulatory Visit: Admitting: Internal Medicine

## 2024-06-25 ENCOUNTER — Other Ambulatory Visit: Payer: Self-pay

## 2024-06-25 DIAGNOSIS — R29818 Other symptoms and signs involving the nervous system: Secondary | ICD-10-CM

## 2024-06-27 ENCOUNTER — Ambulatory Visit
Admission: RE | Admit: 2024-06-27 | Discharge: 2024-06-27 | Disposition: A | Discharge status: Home / Self Care | Source: Ambulatory Visit

## 2024-06-27 DIAGNOSIS — R29818 Other symptoms and signs involving the nervous system: Secondary | ICD-10-CM

## 2024-07-31 ENCOUNTER — Other Ambulatory Visit: Payer: Self-pay | Admitting: Internal Medicine

## 2024-07-31 DIAGNOSIS — E538 Deficiency of other specified B group vitamins: Secondary | ICD-10-CM

## 2024-08-21 ENCOUNTER — Other Ambulatory Visit: Payer: Self-pay | Admitting: Internal Medicine

## 2024-08-21 NOTE — Telephone Encounter (Signed)
 Ok to let pt know to switch to generic otc -   Please take OTC Vitamin D3 at 2000 units per day, indefinitely,

## 2024-08-23 ENCOUNTER — Other Ambulatory Visit: Payer: Self-pay | Admitting: Internal Medicine

## 2024-08-25 NOTE — Telephone Encounter (Signed)
 Attempted to call patient to inform them of PCP response but had to leave a voice message

## 2024-09-04 ENCOUNTER — Other Ambulatory Visit: Payer: Self-pay | Admitting: Internal Medicine

## 2024-09-11 ENCOUNTER — Ambulatory Visit: Admitting: Internal Medicine

## 2024-09-19 ENCOUNTER — Other Ambulatory Visit: Payer: Self-pay | Admitting: Internal Medicine

## 2024-10-02 ENCOUNTER — Ambulatory Visit: Admitting: Internal Medicine

## 2024-10-03 ENCOUNTER — Other Ambulatory Visit: Payer: Self-pay | Admitting: Internal Medicine

## 2024-10-29 ENCOUNTER — Encounter: Payer: Self-pay | Admitting: Internal Medicine

## 2024-10-29 ENCOUNTER — Ambulatory Visit: Admitting: Internal Medicine

## 2024-10-29 ENCOUNTER — Ambulatory Visit: Payer: Self-pay | Admitting: Internal Medicine

## 2024-10-29 VITALS — BP 148/88 | HR 77 | Temp 97.9°F | Ht 73.0 in | Wt 262.0 lb

## 2024-10-29 DIAGNOSIS — Z794 Long term (current) use of insulin: Secondary | ICD-10-CM | POA: Diagnosis not present

## 2024-10-29 DIAGNOSIS — M48061 Spinal stenosis, lumbar region without neurogenic claudication: Secondary | ICD-10-CM | POA: Insufficient documentation

## 2024-10-29 DIAGNOSIS — E1165 Type 2 diabetes mellitus with hyperglycemia: Secondary | ICD-10-CM | POA: Diagnosis not present

## 2024-10-29 DIAGNOSIS — I10 Essential (primary) hypertension: Secondary | ICD-10-CM | POA: Diagnosis not present

## 2024-10-29 DIAGNOSIS — Z23 Encounter for immunization: Secondary | ICD-10-CM

## 2024-10-29 DIAGNOSIS — E538 Deficiency of other specified B group vitamins: Secondary | ICD-10-CM

## 2024-10-29 DIAGNOSIS — E559 Vitamin D deficiency, unspecified: Secondary | ICD-10-CM

## 2024-10-29 LAB — LIPID PANEL
Cholesterol: 193 mg/dL (ref 0–200)
HDL: 18.4 mg/dL — ABNORMAL LOW (ref >39.00)
NonHDL: 174.71
Total CHOL/HDL Ratio: 10
Triglycerides: 1071 mg/dL — ABNORMAL HIGH (ref 0.0–149.0)
VLDL: 214.2 mg/dL — ABNORMAL HIGH (ref 0.0–40.0)

## 2024-10-29 LAB — HEPATIC FUNCTION PANEL
ALT: 24 U/L (ref 0–53)
AST: 25 U/L (ref 0–37)
Albumin: 4.1 g/dL (ref 3.5–5.2)
Alkaline Phosphatase: 70 U/L (ref 39–117)
Bilirubin, Direct: 0 mg/dL (ref 0.0–0.3)
Total Bilirubin: 0.7 mg/dL (ref 0.2–1.2)
Total Protein: 7.4 g/dL (ref 6.0–8.3)

## 2024-10-29 LAB — BASIC METABOLIC PANEL WITH GFR
BUN: 16 mg/dL (ref 6–23)
CO2: 26 meq/L (ref 19–32)
Calcium: 9 mg/dL (ref 8.4–10.5)
Chloride: 103 meq/L (ref 96–112)
Creatinine, Ser: 0.94 mg/dL (ref 0.40–1.50)
GFR: 87.14 mL/min (ref >60.00)
Glucose, Bld: 131 mg/dL — ABNORMAL HIGH (ref 70–99)
Potassium: 4.1 meq/L (ref 3.5–5.1)
Sodium: 136 meq/L (ref 135–145)

## 2024-10-29 LAB — VITAMIN D 25 HYDROXY (VIT D DEFICIENCY, FRACTURES): VITD: 21.53 ng/mL — ABNORMAL LOW (ref 30.00–100.00)

## 2024-10-29 LAB — LDL CHOLESTEROL, DIRECT: Direct LDL: 29 mg/dL

## 2024-10-29 LAB — HEMOGLOBIN A1C: Hgb A1c MFr Bld: 8.4 % — ABNORMAL HIGH (ref 4.6–6.5)

## 2024-10-29 MED ORDER — AMLODIPINE BESYLATE 10 MG PO TABS: 10.0000 mg | ORAL_TABLET | Freq: Every day | ORAL | 3 refills | Status: AC

## 2024-10-29 MED ORDER — ATORVASTATIN CALCIUM 80 MG PO TABS: 80.0000 mg | ORAL_TABLET | Freq: Every day | ORAL | 3 refills | Status: AC

## 2024-10-29 MED ORDER — METOPROLOL TARTRATE 100 MG PO TABS: 100.0000 mg | ORAL_TABLET | Freq: Two times a day (BID) | ORAL | 3 refills | Status: AC

## 2024-10-29 MED ORDER — CYANOCOBALAMIN 1000 MCG/ML IJ SOLN: 1000.0000 ug | INTRAMUSCULAR | 2 refills | Status: AC

## 2024-10-29 MED ORDER — BD PEN NEEDLE MINI U/F 31G X 5 MM MISC: 1.0000 | Freq: Every day | 3 refills | Status: AC

## 2024-10-29 MED ORDER — AZITHROMYCIN 250 MG PO TABS: ORAL_TABLET | ORAL | 1 refills | Status: AC

## 2024-10-29 MED ORDER — ICOSAPENT ETHYL 1 G PO CAPS
2.0000 g | ORAL_CAPSULE | Freq: Two times a day (BID) | ORAL | 3 refills | Status: AC
Start: 2024-10-29 — End: ?

## 2024-10-29 NOTE — Assessment & Plan Note (Signed)
 Lab Results  Component Value Date   VITAMINB12 502 02/28/2024   Stable, cont oral replacement - b12 1000 mcg qd

## 2024-10-29 NOTE — Assessment & Plan Note (Signed)
 With long term insulin , with hyperglycemia  Lab Results  Component Value Date   HGBA1C 8.4 (H) 10/29/2024   Uncontrolled mild to mod,  pt to continue current medical treatment farxiga  10 every day, glipizide  10 bid, lantus  64 u every day, and pt prefers to f/u with endocrinology as planned

## 2024-10-29 NOTE — Patient Instructions (Signed)
 You had the flu shot today  Please take all new medication as prescribed   - the antibiotic  Please continue all other medications as before, and refills have been done if requested.  Please have the pharmacy call with any other refills you may need.  Please continue your efforts at being more active, low cholesterol diet, and weight control.  Please keep your appointments with your specialists as you may have planned  Please go to the LAB at the blood drawing area for the tests to be done  You will be contacted by phone if any changes need to be made immediately.  Otherwise, you will receive a letter about your results with an explanation, but please check with MyChart first.  Please make an Appointment to return in 6 months, or sooner if needed

## 2024-10-29 NOTE — Assessment & Plan Note (Signed)
 Mild elevated today, but controlled at home per pt,    BP Readings from Last 3 Encounters:  10/29/24 (!) 148/88  03/10/24 126/82  02/28/24 124/78  Cont same tx - norvasc  10 every day, losartan  100 every day, and metoprolol  100 bid

## 2024-10-29 NOTE — Assessment & Plan Note (Signed)
 Last vitamin D  Lab Results  Component Value Date   VD25OH 21.53 (L) 10/29/2024   Low, to start oral replacement

## 2024-10-29 NOTE — Progress Notes (Signed)
 Patient ID: Kevin Miller, male   DOB: December 23, 1960, 63 y.o.   MRN: [id number]        Chief Complaint: follow up HTN, HLD, DM, sinusitis       HPI:  Kevin Miller is a 63 y.o. male  Here with 2-3 days acute onset fever, facial pain, pressure, headache, general weakness and malaise, and greenish d/c, with mild ST and cough, but pt denies chest pain, wheezing, increased sob or doe, orthopnea, PND, increased LE swelling, palpitations, dizziness or syncope.   Pt denies polydipsia, polyuria, or new focal neuro s/s.    Pt denies fever, wt loss, night sweats, loss of appetite, or other constitutional symptoms  Declines glp1 with hx of pancreatitis. Plans to call optho for appt soon.  Needs med restarts today.  Due for flu shot.  Has known LS spine severe spinal stenosis, now s/p ESI and improved, but still slower to walk.  No falls.    Wt Readings from Last 3 Encounters:  10/29/24 262 lb (118.8 kg)  03/10/24 257 lb (116.6 kg)  02/28/24 255 lb (115.7 kg)   BP Readings from Last 3 Encounters:  10/29/24 (!) 148/88  03/10/24 126/82  02/28/24 124/78         Past Medical History:  Diagnosis Date   Clostridium difficile infection 12/10/2008   Diabetes mellitus    Elevated LFTs 12/10/2009   Fatty liver    Hyperlipidemia    Hypertension    Hypogonadism male 12/10/2010   Leg fracture, left    metal rod   Neck pain    Left   Osteopenia 12/10/2009   back pain   Pancreatic pseudocyst    Resolved   Pancreatitis 12/10/2008   ? etiol   Vitamin B 12 deficiency    Past Surgical History:  Procedure Laterality Date   CHOLECYSTECTOMY     ELBOW SURGERY     right   HERNIA REPAIR     LEG SURGERY     Left fracture   TIBIA FRACTURE SURGERY     left    reports that he has never smoked. He has never used smokeless tobacco. He reports current alcohol use. He reports that he does not use drugs. family history includes Alcohol abuse in his father; COPD in his mother; Colon polyps in his father;  Peripheral vascular disease in his mother. Allergies  Allergen Reactions   Gemfibrozil     REACTION: abn LFTs   Current Outpatient Medications on File Prior to Visit  Medication Sig Dispense Refill   Accu-Chek Softclix Lancets lancets Use as instructed 100 each 3   acetaminophen  (TYLENOL ) 500 MG tablet Take 1,000 mg by mouth every 6 (six) hours as needed for mild pain.     Blood Glucose Monitoring Suppl (ACCU-CHEK GUIDE) w/Device KIT Use to check blood sugars twice a day 1 kit 0   clobetasol  cream (TEMOVATE ) 0.05 % Apply 1 Application topically 2 (two) times daily. 30 g 1   Continuous Glucose Sensor (DEXCOM G7 SENSOR) MISC 1 Device by Does not apply route as directed. 9 each 3   dapagliflozin  propanediol (FARXIGA ) 10 MG TABS tablet Take 1 tablet (10 mg total) by mouth daily before breakfast. 90 tablet 3   fenofibrate  micronized (LOFIBRA) 200 MG capsule Take 1 capsule (200 mg total) by mouth daily before breakfast. 90 capsule 3   glipiZIDE  (GLUCOTROL ) 10 MG tablet Take 1 tablet (10 mg total) by mouth 2 (two) times daily before a meal. 180 tablet 3  glucose blood (ACCU-CHEK GUIDE) test strip USE TO TEST BLOOD SUGAR TWICE A DAY 200 strip 3   Glucose Blood (RELION PREMIER TEST VI) by In Vitro route 2 (two) times daily at 10 AM and 5 PM.     insulin  glargine (LANTUS  SOLOSTAR) 100 UNIT/ML Solostar Pen Inject 64 Units into the skin daily. 60 mL 6   losartan  (COZAAR ) 100 MG tablet TAKE 1 TABLET BY MOUTH EVERY DAY 90 tablet 3   Roflumilast  (ZORYVE ) 0.3 % CREA Use as directed twice per day as needed to affected area 60 g 5   triamcinolone  ointment (KENALOG ) 0.1 % Apply topically 2 (two) times daily.     Vitamin D , Ergocalciferol , (DRISDOL ) 1.25 MG (50000 UNIT) CAPS capsule Take 1 capsule (50,000 Units total) by mouth every 7 (seven) days. 12 capsule 1   No current facility-administered medications on file prior to visit.        ROS:  All others reviewed and negative.  Objective        PE:  BP  (!) 148/88 (BP Location: Right Arm, Patient Position: Sitting, Cuff Size: Normal)   Pulse 77   Temp 97.9 F (36.6 C) (Oral)   Ht 6' 1 (1.854 m)   Wt 262 lb (118.8 kg)   SpO2 96%   BMI 34.57 kg/m                 Constitutional: Pt appears in NAD               HENT: Head: NCAT.                Right Ear: External ear normal.                 Left Ear: External ear normal. Bilat tm's with mild erythema.  Max sinus areas mild tender.  Pharynx with mild erythema, no exudate               Eyes: . Pupils are equal, round, and reactive to light. Conjunctivae and EOM are normal               Nose: without d/c or deformity               Neck: Neck supple. Gross normal ROM               Cardiovascular: Normal rate and regular rhythm.                 Pulmonary/Chest: Effort normal and breath sounds without rales or wheezing.                Abd:  Soft, NT, ND, + BS, no organomegaly               Neurological: Pt is alert. At baseline orientation, motor grossly intact               Skin: Skin is warm. No rashes, no other new lesions, LE edema - none               Psychiatric: Pt behavior is normal without agitation   Micro: none  Cardiac tracings I have personally interpreted today:  none  Pertinent Radiological findings (summarize): none   Lab Results  Component Value Date   WBC 5.6 02/28/2024   HGB 15.9 02/28/2024   HCT 46.9 02/28/2024   PLT 217.0 02/28/2024   GLUCOSE 131 (H) 10/29/2024   CHOL 193 10/29/2024   TRIG (H) 10/29/2024  1071.0 Triglyceride is over 400; calculations on Lipids are invalid.   HDL 18.40 (L) 10/29/2024   LDLDIRECT 29.0 10/29/2024   LDLCALC  02/28/2024     Comment:     . LDL cholesterol not calculated. Triglyceride levels greater than 400 mg/dL invalidate calculated LDL results. . Reference range: <100 . Desirable range <100 mg/dL for primary prevention;   <70 mg/dL for patients with CHD or diabetic patients  with > or = 2 CHD risk factors. SABRA LDL-C is  now calculated using the Martin-Hopkins  calculation, which is a validated novel method providing  better accuracy than the Friedewald equation in the  estimation of LDL-C.  Gladis APPLETHWAITE et al. SANDREA. 7986;689(80): 2061-2068  (http://education.[facility].com/faq/FAQ164)    ALT 24 10/29/2024   AST 25 10/29/2024   NA 136 10/29/2024   K 4.1 10/29/2024   CL 103 10/29/2024   CREATININE 0.94 10/29/2024   BUN 16 10/29/2024   CO2 26 10/29/2024   TSH 1.60 02/28/2024   PSA 3.56 02/28/2024   INR 1.1 02/03/2022   HGBA1C 8.4 (H) 10/29/2024   MICROALBUR <0.7 02/28/2024   Assessment/Plan:  MARQUETTE BLODGETT is a 63 y.o. White or Caucasian [1] male with  has a past medical history of Clostridium difficile infection (12/10/2008), Diabetes mellitus, Elevated LFTs (12/10/2009), Fatty liver, Hyperlipidemia, Hypertension, Hypogonadism male (12/10/2010), Leg fracture, left, Neck pain, Osteopenia (12/10/2009), Pancreatic pseudocyst, Pancreatitis (12/10/2008), and Vitamin B 12 deficiency.  Vitamin D  deficiency Last vitamin D  Lab Results  Component Value Date   VD25OH 21.53 (L) 10/29/2024   Low, to start oral replacement   Hypertension, uncontrolled Mild elevated today, but controlled at home per pt,    BP Readings from Last 3 Encounters:  10/29/24 (!) 148/88  03/10/24 126/82  02/28/24 124/78  Cont same tx - norvasc  10 every day, losartan  100 every day, and metoprolol  100 bid   Diabetes (HCC) With long term insulin , with hyperglycemia  Lab Results  Component Value Date   HGBA1C 8.4 (H) 10/29/2024   Uncontrolled mild to mod,  pt to continue current medical treatment farxiga  10 every day, glipizide  10 bid, lantus  64 u every day, and pt prefers to f/u with endocrinology as planned   B12 deficiency Lab Results  Component Value Date   VITAMINB12 502 02/28/2024   Stable, cont oral replacement - b12 1000 mcg qd  Followup: Return in about 6 months (around 04/28/2025).  Lynwood Rush, MD  10/29/2024 12:49 PM [facility]

## 2025-01-04 ENCOUNTER — Ambulatory Visit: Admitting: Internal Medicine

## 2025-03-05 ENCOUNTER — Ambulatory Visit: Admitting: Internal Medicine
